# Patient Record
Sex: Male | Born: 1970 | Race: Black or African American | Hispanic: No | Marital: Single | Smoking: Former smoker
Health system: Southern US, Community
[De-identification: ages and names within clinical notes are randomized; demographics above are authoritative.]

## PROBLEM LIST (undated history)

## (undated) DIAGNOSIS — IMO0002 Reserved for concepts with insufficient information to code with codable children: Secondary | ICD-10-CM

## (undated) DIAGNOSIS — I10 Essential (primary) hypertension: Secondary | ICD-10-CM

## (undated) DIAGNOSIS — E119 Type 2 diabetes mellitus without complications: Secondary | ICD-10-CM

## (undated) DIAGNOSIS — F32A Depression, unspecified: Secondary | ICD-10-CM

## (undated) HISTORY — DX: Reserved for concepts with insufficient information to code with codable children: IMO0002

## (undated) HISTORY — DX: Depression, unspecified: F32.A

## (undated) HISTORY — PX: EYE SURGERY: SHX253

---

## 2010-10-16 ENCOUNTER — Ambulatory Visit: Payer: Self-pay | Admitting: Internal Medicine

## 2011-12-18 ENCOUNTER — Ambulatory Visit: Payer: Self-pay | Admitting: Internal Medicine

## 2011-12-24 ENCOUNTER — Ambulatory Visit: Payer: Self-pay | Admitting: Internal Medicine

## 2011-12-24 LAB — COMPREHENSIVE METABOLIC PANEL
Anion Gap: 8 (ref 7–16)
BUN: 12 mg/dL (ref 7–18)
Bilirubin,Total: 0.3 mg/dL (ref 0.2–1.0)
Calcium, Total: 8.8 mg/dL (ref 8.5–10.1)
Chloride: 102 mmol/L (ref 98–107)
Co2: 28 mmol/L (ref 21–32)
Creatinine: 1.07 mg/dL (ref 0.60–1.30)
Osmolality: 285 (ref 275–301)
Potassium: 4.4 mmol/L (ref 3.5–5.1)
SGOT(AST): 15 U/L (ref 15–37)
Sodium: 138 mmol/L (ref 136–145)

## 2011-12-24 LAB — CBC WITH DIFFERENTIAL/PLATELET
Basophil #: 0 10*3/uL (ref 0.0–0.1)
Eosinophil #: 0.2 10*3/uL (ref 0.0–0.7)
Eosinophil %: 2.4 %
HCT: 39.7 % — ABNORMAL LOW (ref 40.0–52.0)
HGB: 13.5 g/dL (ref 13.0–18.0)
Lymphocyte #: 3.7 10*3/uL — ABNORMAL HIGH (ref 1.0–3.6)
Lymphocyte %: 36.8 %
MCH: 29.4 pg (ref 26.0–34.0)
MCV: 86 fL (ref 80–100)
Neutrophil #: 5.4 10*3/uL (ref 1.4–6.5)
Neutrophil %: 53.9 %
Platelet: 324 10*3/uL (ref 150–440)

## 2011-12-24 LAB — MAGNESIUM: Magnesium: 2.2 mg/dL

## 2012-09-09 ENCOUNTER — Ambulatory Visit: Payer: Self-pay | Admitting: Family Medicine

## 2012-11-30 ENCOUNTER — Ambulatory Visit: Payer: Self-pay | Admitting: Family Medicine

## 2013-12-22 ENCOUNTER — Inpatient Hospital Stay: Payer: Self-pay | Admitting: Internal Medicine

## 2013-12-22 LAB — URINALYSIS, COMPLETE
BILIRUBIN, UR: NEGATIVE
BLOOD: NEGATIVE
Bacteria: NONE SEEN
Glucose,UR: >=500 mg/dL (ref 0–75)
Leukocyte Esterase: NEGATIVE
Nitrite: NEGATIVE
Ph: 5 (ref 4.5–8.0)
Protein: 30
SPECIFIC GRAVITY: 1.025 (ref 1.003–1.030)
Squamous Epithelial: 1
WBC UR: 4 /HPF (ref 0–5)

## 2013-12-22 LAB — BASIC METABOLIC PANEL
ANION GAP: 11 (ref 7–16)
BUN: 27 mg/dL — ABNORMAL HIGH (ref 7–18)
Calcium, Total: 10 mg/dL (ref 8.5–10.1)
Chloride: 103 mmol/L (ref 98–107)
Co2: 24 mmol/L (ref 21–32)
Creatinine: 2.24 mg/dL — ABNORMAL HIGH (ref 0.60–1.30)
EGFR (African American): 40 — ABNORMAL LOW
EGFR (Non-African Amer.): 35 — ABNORMAL LOW
Glucose: 201 mg/dL — ABNORMAL HIGH (ref 65–99)
OSMOLALITY: 286 (ref 275–301)
POTASSIUM: 3.6 mmol/L (ref 3.5–5.1)
SODIUM: 138 mmol/L (ref 136–145)

## 2013-12-22 LAB — CBC WITH DIFFERENTIAL/PLATELET
BASOS ABS: 0 10*3/uL (ref 0.0–0.1)
Basophil %: 0.2 %
Eosinophil #: 0.1 10*3/uL (ref 0.0–0.7)
Eosinophil %: 0.6 %
HCT: 42.2 % (ref 40.0–52.0)
HGB: 14.5 g/dL (ref 13.0–18.0)
Lymphocyte #: 3.9 10*3/uL — ABNORMAL HIGH (ref 1.0–3.6)
Lymphocyte %: 30.1 %
MCH: 28.9 pg (ref 26.0–34.0)
MCHC: 34.3 g/dL (ref 32.0–36.0)
MCV: 84 fL (ref 80–100)
Monocyte #: 0.8 x10 3/mm (ref 0.2–1.0)
Monocyte %: 6.1 %
NEUTROS PCT: 63 %
Neutrophil #: 8.1 10*3/uL — ABNORMAL HIGH (ref 1.4–6.5)
PLATELETS: 369 10*3/uL (ref 150–440)
RBC: 5.02 10*6/uL (ref 4.40–5.90)
RDW: 13.4 % (ref 11.5–14.5)
WBC: 12.9 10*3/uL — AB (ref 3.8–10.6)

## 2013-12-22 LAB — CK: CK, Total: 384 U/L — ABNORMAL HIGH

## 2013-12-22 LAB — TROPONIN I: Troponin-I: <0.02 ng/mL

## 2013-12-23 LAB — CK: CK, Total: 245 U/L

## 2013-12-23 LAB — BASIC METABOLIC PANEL
ANION GAP: 5 — AB (ref 7–16)
BUN: 21 mg/dL — AB (ref 7–18)
CREATININE: 1.36 mg/dL — AB (ref 0.60–1.30)
Calcium, Total: 8.6 mg/dL (ref 8.5–10.1)
Chloride: 106 mmol/L (ref 98–107)
Co2: 28 mmol/L (ref 21–32)
EGFR (African American): >60
GLUCOSE: 165 mg/dL — AB (ref 65–99)
Osmolality: 284 (ref 275–301)
Potassium: 3.8 mmol/L (ref 3.5–5.1)
SODIUM: 139 mmol/L (ref 136–145)

## 2013-12-23 LAB — CBC WITH DIFFERENTIAL/PLATELET
BASOS ABS: 0 10*3/uL (ref 0.0–0.1)
BASOS PCT: 0.4 %
EOS PCT: 2.3 %
Eosinophil #: 0.2 10*3/uL (ref 0.0–0.7)
HCT: 38.3 % — ABNORMAL LOW (ref 40.0–52.0)
HGB: 13.1 g/dL (ref 13.0–18.0)
Lymphocyte #: 3.4 10*3/uL (ref 1.0–3.6)
Lymphocyte %: 35.6 %
MCH: 29.1 pg (ref 26.0–34.0)
MCHC: 34.3 g/dL (ref 32.0–36.0)
MCV: 85 fL (ref 80–100)
Monocyte #: 0.7 x10 3/mm (ref 0.2–1.0)
Monocyte %: 7.8 %
NEUTROS ABS: 5.1 10*3/uL (ref 1.4–6.5)
Neutrophil %: 53.9 %
Platelet: 303 10*3/uL (ref 150–440)
RBC: 4.52 10*6/uL (ref 4.40–5.90)
RDW: 13.6 % (ref 11.5–14.5)
WBC: 9.5 10*3/uL (ref 3.8–10.6)

## 2013-12-23 LAB — MAGNESIUM: MAGNESIUM: 2 mg/dL

## 2013-12-23 LAB — HEMOGLOBIN A1C: HEMOGLOBIN A1C: 10.4 % — AB (ref 4.2–6.3)

## 2014-01-31 DIAGNOSIS — K219 Gastro-esophageal reflux disease without esophagitis: Secondary | ICD-10-CM | POA: Insufficient documentation

## 2014-01-31 DIAGNOSIS — F331 Major depressive disorder, recurrent, moderate: Secondary | ICD-10-CM | POA: Insufficient documentation

## 2014-10-20 NOTE — H&P (Signed)
PATIENT NAME:  Gabriel Shaw, [name] MR#:  [id number] DATE OF BIRTH:  [birth year]  DATE OF ADMISSION:  12/22/2013  REASON FOR ADMISSION: Acute kidney injury.  CHIEF COMPLAINT: Body aches and dehydration.   REFERRING PHYSICIAN: Dr. Margarita GrizzleWoodruff.   PRIMARY CARE PHYSICIAN: None at this moment.  HISTORY OF PRESENT ILLNESS: A 44 year old gentleman with history of diabetes, osteoarthritis, bilateral hip pain, status post removal of brain tumor with consequences leaving him with mood disorder and memory loss. 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 The patient started at 8:00 a.m. and tried to hydrate himself. He was drinking large amounts of water, but it was so hot, they were sweating a lot, and he got dehydrated really quickly. The patient also worked yesterday and he was still really tired from the work routine of yesterday. At around 2:00 p.m., the patient started feeling dizzy, lightheaded, had foggy vision, started to get really nauseous and was not able to drink anymore. He has not been able to urinate much today and his urine has been really concentrated. The patient started to have achy pains all over his body, muscle aches, and joint aches with intensity of 4/10. No radiation. Cramp-like during the whole afternoon. The patient is admitted to the Emergency Department. We were asked for consultation as his creatinine is 2.24. His previous creatinine was around 1.  REVIEW OF SYSTEMS:   CONSTITUTIONAL: No fever, fatigue, weight loss, or weight gain.  EYES: No blurry vision or double vision.  Foggy vision earlier, but now resolved. EARS, NOSE, AND THROAT:  No difficulty swallowing or tinnitus.  RESPIRATORY: No cough or wheezing. Patient smokes 2-3 cigarettes a day. Smoking cessation counseling given to the patient for 3 minutes, and he is not ready to quit smoking.  CARDIOVASCULAR: No chest pain, orthopnea, or palpitations.  GASTROINTESTINAL: No nausea, vomiting,  abdominal pain, or constipation prior to today. Today he was nauseous, but it has resolved.  GENITOURINARY: No dysuria, hematuria, changes in frequency.   ENDOCRINE: No polyuria, polydipsia, or polyphagia.  HEMATOLOGIC AND LYMPHATIC: No anemia or easy bruising.  SKIN: No rashes or petechiae.  MUSCULOSKELETAL: No neck pain or back pain. Positive bilateral hip pain.  NEUROLOGIC: Status post removal of brain tumor and some memory loss after that.  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   PAST MEDICAL HISTORY:  1.  Type 2 diabetes, insulin-dependent.  2.  Osteoarthritis.  3.  Bilateral hip pain.  4.  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  5.  History of brain tumor, now removed.  6.  Memory loss.   ALLERGIES: NO KNOWN DRUG ALLERGIES.   PAST SURGICAL HISTORY: Brain tumor removed in 1997.   FAMILY HISTORY: Positive for CVA, myocardial infarction, diabetes, and colon cancer in his grandmother.   SOCIAL HISTORY: Alcohol: He drinks 2 beers a couple times a week after he works long days, but not every day.  Tobacco: He smokes 2-3 cigarettes every day. No IV drugs. No other drugs. Lives with his mom.   MEDICATIONS: Metformin 1000 mg twice a day and positive long-lasting insulin, either Levemir or Lantus 25 units twice daily. Denies any other medications.   PHYSICAL EXAMINATION:  VITAL SIGNS: Blood pressure 137/94, pulse 94-105, respirations up to 24, temperature 98, oxygen saturation 98% on room air.  GENERAL: The patient is alert and oriented x 3 in no acute distress. No respiratory distress. Hemodynamically stable.  HEENT: Pupils are equal, round, and  reactive. Extraocular movements are intact. Mucosa are moist. Anicteric sclerae. Pink conjunctivae.  No oral lesions. No oropharyngeal exudates.  NECK: Supple. No JVD. No thyromegaly. No adenopathy. No carotid bruits. No rigidity.  CARDIOVASCULAR: Regular rate and rhythm. No murmurs, rubs, or gallops.   Slightly tachycardic. No displacement of PMI.  LUNGS: Clear without any wheezing or crepitus. No use of accessory muscles.  ABDOMEN: Soft, nontender, nondistended. No hepatosplenomegaly. No masses. Bowel sounds are positive.  EXTREMITIES: No edema, cyanosis, or clubbing. Pulses +2. Capillary refill less than 3.  GENITALIA:  Deferred.  MUSCULOSKELETAL: No joint effusions, soreness, or swelling.  LYMPHATIC: Negative for lymphadenopathy in neck or supraclavicular areas. NEUROLOGICAL: Cranial nerves II-XII intact. Strength is 5/5 in all 4 extremities.  PSYCHIATRIC: No agitation. The patient was alert and oriented x 3.   RESULTS: Glucose is 201, BUN 27, creatinine 2.24, sodium 138, potassium 2.6, total CK 384. Troponin is 0.02.  White blood count is 12.9. Urinalysis 30 mg of protein for white blood cells.   CHEST X-RAY: No active disease.   ASSESSMENT AND PLAN: A 44 year old gentleman with history of diabetes admitted with  acute kidney injury.   1.  Acute kidney injury. The patient got dehydrated after working outside without the proper hydration. The patient has likely intravascular volume depletion with a prerenal acute kidney injury. At this moment, the patient is hemodynamically stable. We are going to continue IV fluids 125 mL an hour and follow creatinine in the morning.  2.  The patient has a lot of body aches, unspecific. This is likely due to heat stroke symptoms. His CK is slightly elevated  in the 300s. We are going to follow up on CK in the morning to rule out rhabdomyolysis.   3.  Continue hydration. Ultrasound of the kidneys to rule out the possibility of obstruction as the patient has not been urinating much today.   4.  Diabetes. The patient has metformin and Levemir.  We are going to give him the Levemir that he takes, which is 25 units a day, and hold metformin as the patient is in acute kidney injury.   5.  Leukocytosis. This is likely secondary to the dehydration, reactive.  The patient has systemic inflammatory response syndrome and is secondary to the acute kidney injury. He has leukocytosis with elevation of heart rate and respiratory rate, but not any significant signs of infection. As far as his diabetes, we are going to give him the Levemir only 10 units.  If it rises,  then give him a total of 25 as he has not been eating and drinking much and he was nauseous. Other than that, the patient is doing okay.   6.  Deep vein thrombosis prophylaxis with heparin.   7.  Gastrointestinal prophylaxis gastrointestinal prophylaxis with Protonix.   I spent about 45 minutes with this patient.     ____________________________ Felipa Furnace, MD rsg:ts D: 12/22/2013 18:12:06 ET T: 12/22/2013 18:49:18 ET JOB#: [id number]  cc: Felipa Furnace, MD, <Dictator>  Juanda Chance MD ELECTRONICALLY SIGNED 01/11/2014 1:34

## 2014-10-20 NOTE — Discharge Summary (Signed)
PATIENT NAME:  Gabriel Shaw, Gabriel Shaw MR#:  [id number] DATE OF BIRTH:  [birth year]  DATE OF ADMISSION:  12/22/2013 DATE OF DISCHARGE:  12/23/2013  FINAL DIAGNOSES: 1. Heat stroke, acute renal failure, dehydration.  2. Diabetes.  3. Tobacco abuse.   MEDICATIONS: On discharge: He uses 25 units of either Lantus or Levemir at home, and he takes that once a day. I stopped the ibuprofen and metformin.  DIET: Carbohydrate-controlled diet, regular consistency.   ACTIVITY: As tolerated. Follow-up 1 to 2 weeks with your medical doctor.   HOSPITAL COURSE: The patient was admitted 12/22/2013 with body aches, dehydration, was admitted with acute renal failure and given IV fluid hydration.   LABORATORY AND RADIOLOGICAL DATA DURING THE HOSPITAL COURSE: Included a troponin that was negative. CPK 384. Urinalysis greater than 500 mg/dL of glucose. Glucose 201, BUN 27, creatinine 2.24, sodium 138, potassium 3.6, chloride 103, CO2 24, calcium 10. White blood cell count 12.9, hemoglobin and hematocrit 14.5 and 42.2, platelet count of 369,000. Hemoglobin A1c 10.4. Creatinine upon discharge 1.36. White count 95, hemoglobin 13.1. Chest x-ray: No acute disease.  Ultrasound of the kidney normal.   HOSPITAL COURSE PER PROBLEM LIST:  1. For the patient's heat stroke, acute renal failure and dehydration: The patient was given IV fluid hydration. Creatinine improved down to 1.3. Follow up as outpatient for this. Patient can continue hydration at home. I think part of the issue is he works at a truck stop, working trucks, and was out in the heat. He tries to stay hydrated but was not feeling good for a couple days.  2. Diabetes. I stopped his metformin with his acute renal failure. Can consider restarting it as outpatient. Continue the insulin as outpatient as he takes at home.  3. Tobacco abuse. Smoking cessation counseling done during the hospital stay.   TIME SPENT ON DISCHARGE: 35 minutes    ____________________________ Herschell Dimesichard J. Renae GlossWieting, MD rjw:lm D: 12/23/2013 16:05:00 ET T: 12/24/2013 05:26:03 ET JOB#: [id number]  cc: Herschell Dimesichard J. Renae GlossWieting, MD, <Dictator> Salley ScarletICHARD J  MD ELECTRONICALLY SIGNED 12/25/2013 15:34

## 2017-06-25 DIAGNOSIS — E1142 Type 2 diabetes mellitus with diabetic polyneuropathy: Secondary | ICD-10-CM | POA: Insufficient documentation

## 2017-07-13 ENCOUNTER — Encounter: Payer: Self-pay | Admitting: Emergency Medicine

## 2017-07-13 ENCOUNTER — Emergency Department: Payer: BLUE CROSS/BLUE SHIELD

## 2017-07-13 ENCOUNTER — Other Ambulatory Visit: Payer: Self-pay

## 2017-07-13 ENCOUNTER — Emergency Department
Admission: EM | Admit: 2017-07-13 | Discharge: 2017-07-13 | Disposition: A | Discharge status: Home / Self Care | Payer: BLUE CROSS/BLUE SHIELD | Attending: Emergency Medicine | Admitting: Emergency Medicine

## 2017-07-13 DIAGNOSIS — R531 Weakness: Secondary | ICD-10-CM | POA: Insufficient documentation

## 2017-07-13 DIAGNOSIS — R1013 Epigastric pain: Secondary | ICD-10-CM | POA: Diagnosis not present

## 2017-07-13 DIAGNOSIS — R110 Nausea: Secondary | ICD-10-CM | POA: Diagnosis not present

## 2017-07-13 DIAGNOSIS — R1010 Upper abdominal pain, unspecified: Secondary | ICD-10-CM | POA: Diagnosis not present

## 2017-07-13 DIAGNOSIS — R109 Unspecified abdominal pain: Secondary | ICD-10-CM | POA: Diagnosis not present

## 2017-07-13 DIAGNOSIS — Z87891 Personal history of nicotine dependence: Secondary | ICD-10-CM | POA: Diagnosis not present

## 2017-07-13 DIAGNOSIS — L97529 Non-pressure chronic ulcer of other part of left foot with unspecified severity: Secondary | ICD-10-CM | POA: Diagnosis not present

## 2017-07-13 DIAGNOSIS — R55 Syncope and collapse: Secondary | ICD-10-CM

## 2017-07-13 DIAGNOSIS — E11621 Type 2 diabetes mellitus with foot ulcer: Secondary | ICD-10-CM | POA: Diagnosis not present

## 2017-07-13 DIAGNOSIS — R079 Chest pain, unspecified: Secondary | ICD-10-CM | POA: Diagnosis present

## 2017-07-13 LAB — HEPATIC FUNCTION PANEL
ALBUMIN: 3.8 g/dL (ref 3.5–5.0)
ALK PHOS: 69 U/L (ref 38–126)
ALT: 16 U/L — ABNORMAL LOW (ref 17–63)
AST: 28 U/L (ref 15–41)
BILIRUBIN TOTAL: 0.7 mg/dL (ref 0.3–1.2)
Bilirubin, Direct: 0.2 mg/dL (ref 0.1–0.5)
Indirect Bilirubin: 0.5 mg/dL (ref 0.3–0.9)
Total Protein: 7.7 g/dL (ref 6.5–8.1)

## 2017-07-13 LAB — GLUCOSE, CAPILLARY: Glucose-Capillary: 114 mg/dL — ABNORMAL HIGH (ref 65–99)

## 2017-07-13 LAB — BASIC METABOLIC PANEL
Anion gap: 15 (ref 5–15)
BUN: 15 mg/dL (ref 6–20)
CHLORIDE: 100 mmol/L — AB (ref 101–111)
CO2: 25 mmol/L (ref 22–32)
Calcium: 9.5 mg/dL (ref 8.9–10.3)
Creatinine, Ser: 1.43 mg/dL — ABNORMAL HIGH (ref 0.61–1.24)
GFR calc Af Amer: >60 mL/min (ref >60)
GFR calc non Af Amer: 57 mL/min — ABNORMAL LOW (ref >60)
GLUCOSE: 150 mg/dL — AB (ref 65–99)
POTASSIUM: 4.6 mmol/L (ref 3.5–5.1)
Sodium: 140 mmol/L (ref 135–145)

## 2017-07-13 LAB — LIPASE, BLOOD: Lipase: 28 U/L (ref 11–51)

## 2017-07-13 LAB — CBC
HEMATOCRIT: 43.4 % (ref 40.0–52.0)
Hemoglobin: 14.4 g/dL (ref 13.0–18.0)
MCH: 27.8 pg (ref 26.0–34.0)
MCHC: 33.1 g/dL (ref 32.0–36.0)
MCV: 84.1 fL (ref 80.0–100.0)
Platelets: 427 10*3/uL (ref 150–440)
RBC: 5.16 MIL/uL (ref 4.40–5.90)
RDW: 13.2 % (ref 11.5–14.5)
WBC: 11.7 10*3/uL — ABNORMAL HIGH (ref 3.8–10.6)

## 2017-07-13 LAB — TROPONIN I: Troponin I: <0.03 ng/mL (ref <0.03)

## 2017-07-13 MED ORDER — SODIUM CHLORIDE 0.9 % IV BOLUS (SEPSIS)
500.0000 mL | Freq: Once | INTRAVENOUS | Status: AC
  Administered 2017-07-13: 500 mL via INTRAVENOUS

## 2017-07-13 NOTE — ED Triage Notes (Signed)
Pt to ed from Dr. Racheal Patchesroxler's office for chest pain and nausea. Pt was being seen there for diabetic ulcer.  While waiting pt started having epigastric and chest pain and became weak.  Pt brought over via wheelchair for same.  Pt placed in room 4 and iV started and labs drawn.  Pt placed on monitor and EKG done.

## 2017-07-13 NOTE — ED Provider Notes (Signed)
[facility] Emergency Department Provider Note   ____________________________________________   First MD Initiated Contact with Patient 07/13/17 1557     (approximate)  I have reviewed the triage vital signs and the nursing notes.   HISTORY  Chief Complaint Chest Pain    HPI Karel JarvisSamuel E Stovall is a 47 y.o. male to history of diabetes, also under the care of podiatry for a ulcer on the left lower foot.  Patient reports that he was at the podiatry office, he was in the waiting room and reports he started feeling very nauseous and hot.  They got him up to walk to the see the office, he reports he started feeling very lightheaded, and experienced a discomfort in his upper abdomen which she describes a feeling like a "ball" occurring in the upper mid abdomen.  Associated with nausea but no vomiting.  He reports the pain sort of radiate up into his chest briefly.  At the present time his symptoms have resolved.  Reports he feels much better, he tells me that he has experienced this kind of symptomatology off and on for several times in the past, reports he "never really thought much of it".  Denies passing out.  No palpitations.  No radiating pain to the back jaw or arms.  No numbness or weakness.  Again, he reports he feels better now.  He does report that he had not had anything to eat or drink today except for a couple peanut butter crackers.  Reports is a little unusual, but he had an appointment today.  He is currently on antibiotics for his left foot.  No fevers.  History reviewed. No pertinent past medical history.  There are no active problems to display for this patient.   History reviewed. No pertinent surgical history.  Prior to Admission medications   Not on File    Allergies Patient has no known allergies.  History reviewed. No pertinent family history.  Social History Social History   Tobacco Use  . Smoking status: Former Games developermoker  . Smokeless  tobacco: Never Used  Substance Use Topics  . Alcohol use: No    Frequency: Never  . Drug use: No    Review of Systems Constitutional: No fever/chills Eyes: No visual changes. ENT: No sore throat. Cardiovascular: Denies chest pain except as noted in HPI. Respiratory: Denies shortness of breath. Gastrointestinal: No diarrhea.  No constipation. Genitourinary: Negative for dysuria. Musculoskeletal: Negative for back pain. Skin: Negative for rash. Neurological: Negative for headaches, focal weakness or numbness.    ____________________________________________   PHYSICAL EXAM:  VITAL SIGNS: ED Triage Vitals [07/13/17 1541]  Enc Vitals Group     BP 116/87     Pulse Rate 90     Resp 16     Temp 97.6 F (36.4 C)     Temp Source Oral     SpO2 98 %     Weight 230 lb (104.3 kg)     Height      Head Circumference      Peak Flow      Pain Score      Pain Loc      Pain Edu?      Excl. in GC?     Constitutional: Alert and oriented. Well appearing and in no acute distress. Eyes: Conjunctivae are normal. Head: Atraumatic. Nose: No congestion/rhinnorhea. Mouth/Throat: Mucous membranes are moist. Neck: No stridor.   Cardiovascular: Normal rate, regular rhythm. Grossly normal heart sounds.  Good peripheral  circulation. Respiratory: Normal respiratory effort.  No retractions. Lungs CTAB. Gastrointestinal: Soft and nontender except for some mild tenderness in the epigastrium without rebound or guarding. No distention. Musculoskeletal: No lower extremity tenderness nor edema.  The left lower foot bandages removed, has a clean appearing ulcer at the base of the left foot.  He and his mother reported appears to be looking much better than it did previous.  No evidence of superinfection noted.  No extending erythema. Neurologic:  Normal speech and language. No gross focal neurologic deficits are appreciated.  Skin:  Skin is warm, dry and intact. No rash noted. Psychiatric: Mood and  affect are normal. Speech and behavior are normal.  ____________________________________________   LABS (all labs ordered are listed, but only abnormal results are displayed)  Labs Reviewed  BASIC METABOLIC PANEL - Abnormal; Notable for the following components:      Result Value   Chloride 100 (*)    Glucose, Bld 150 (*)    Creatinine, Ser 1.43 (*)    GFR calc non Af Amer 57 (*)    All other components within normal limits  CBC - Abnormal; Notable for the following components:   WBC 11.7 (*)    All other components within normal limits  GLUCOSE, CAPILLARY - Abnormal; Notable for the following components:   Glucose-Capillary 114 (*)    All other components within normal limits  HEPATIC FUNCTION PANEL - Abnormal; Notable for the following components:   ALT 16 (*)    All other components within normal limits  TROPONIN I  LIPASE, BLOOD   ____________________________________________  EKG  Reviewed interrupt me at 1540 Heart rate 90 QRS 80 QTC 420 Normal sinus rhythm, no evidence of ischemia or ectopy, no ST elevation ____________________________________________  RADIOLOGY  Dg Chest 2 View  Result Date: 07/13/2017 CLINICAL DATA:  Onset of epigastric epigastric and chest pain with nausea and weakness while being seen for diabetic ulcer. EXAM: CHEST  2 VIEW COMPARISON:  Portable chest x-ray of December 22, 2013 FINDINGS: The lungs are adequately inflated and clear. The heart and pulmonary vascularity are normal. The mediastinum is normal in width. There is no pleural effusion. The bony thorax exhibits no acute abnormality. IMPRESSION: There is no pneumonia nor other acute cardiopulmonary abnormality. Electronically Signed   By: David  [place] M.D.   On: 07/13/2017 16:23   US Abdomen Limited Ruq  Result Date: 07/13/2017 CLINICAL DATA:  Abdominal pain, nausea and weakness. EXAM: ULTRASOUND ABDOMEN LIMITED RIGHT UPPER QUADRANT COMPARISON:  None. FINDINGS: Gallbladder: No gallstones or  wall thickening visualized. No sonographic Murphy sign noted by sonographer. Common bile duct: Diameter: 3.0 mm. Liver: No focal lesion identified. Within normal limits in parenchymal echogenicity. Portal vein is patent on color Doppler imaging with normal direction of blood flow towards the liver. IMPRESSION: No acute findings. Electronically Signed   By: Elberta Fortis M.D.   On: 07/13/2017 17:21    Chest x-ray and right upper quadrant ultrasound normal ____________________________________________   PROCEDURES  Procedure(s) performed: None  Procedures  Critical Care performed: No  ____________________________________________   INITIAL IMPRESSION / ASSESSMENT AND PLAN / ED COURSE  Pertinent labs & imaging results that were available during my care of the patient were reviewed by me and considered in my medical decision making (see chart for details).  The patient presents for evaluation of episode of upper epigastric discomfort, nausea and lightheadedness from the primary care clinic.  Anecdotal report was that they noticed he was hypotensive during that  time as well, but on arrival here he is normotensive reports his symptoms are much better.  Very atypical symptoms for an acute cardiac condition and seems atypical.  I will send a troponin, but his EKG is reassuring and he denies any chest pain.  His symptoms do sound like possibly vasovagal in nature, possibly gastrointestinal the very reassuring exam at this time with some mild epigastric discomfort.  Given his history of diabetes and intermittent symptoms similar in the past obtain ultrasound to evaluate make sure there is no gallbladder related disease.  Check LFTs, lipase, troponin.      ----------------------------------------- 6:04 PM on 07/13/2017 -----------------------------------------  Patient reports he continues to feel well, asymptomatic with no pain.  Discussed with the patient, and he actually tells me that he has an  appointment set up with cardiology on Thursday that his primary care doctor had made for him as is been having these intermittent symptoms for several days.  His troponin is normal EKG reassuring is pain-free.  His symptoms sound primarily upper abdominal nature to me though, I do not believe this represents acute cardiac disease.  Discussed with patient his mother, careful return precautions and will follow closely with primary as well as a scheduled cardiology appointment for Thursday.  Discussed careful return precautions  Heart score low risk. Low risk for MACE.  ____________________________________________   FINAL CLINICAL IMPRESSION(S) / ED DIAGNOSES  Final diagnoses:  Abdominal pain  Nausea  Vasovagal episode      NEW MEDICATIONS STARTED DURING THIS VISIT:  New Prescriptions   No medications on file     Note:  This document was prepared using Dragon voice recognition software and may include unintentional dictation errors.     Sharyn Creamer, MD 07/13/17 [phone]

## 2017-07-13 NOTE — ED Notes (Signed)
Patient back from US.

## 2017-07-13 NOTE — Discharge Instructions (Signed)
You have been seen in the Emergency Department (ED) today for evaluation.  As we have discussed today?s test results are normal, but you may require further testing.  Please follow up with the recommended doctor as instructed above in these documents regarding today?s emergent visit and your recent symptoms to discuss further management.    Return to the Emergency Department (ED) if you experience any further chest pain/pressure/tightness, difficulty breathing, or sudden sweating, or other symptoms that concern you.

## 2018-06-13 IMAGING — US US ABDOMEN LIMITED
1 series · 14 of 25 positions shown · non-contrast
Comparison: None.

CLINICAL DATA: Abdominal pain, nausea and weakness.

EXAM:
ULTRASOUND ABDOMEN LIMITED RIGHT UPPER QUADRANT

[Series 1: us abdomen limited · 0.28mm/px · 14 of 48 slices shown]
[im 1/48]
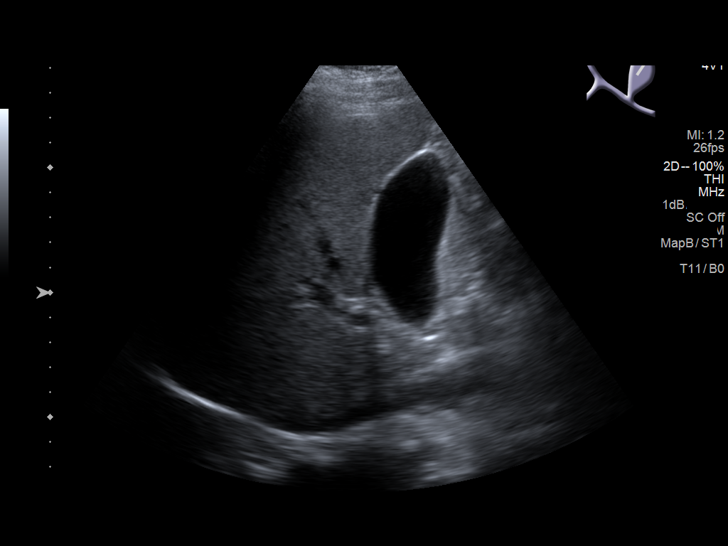
[im 4/48]
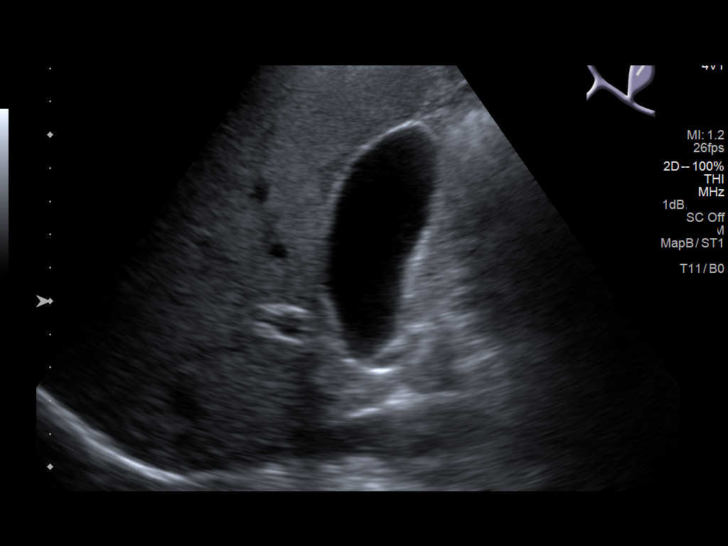
[im 8/48]
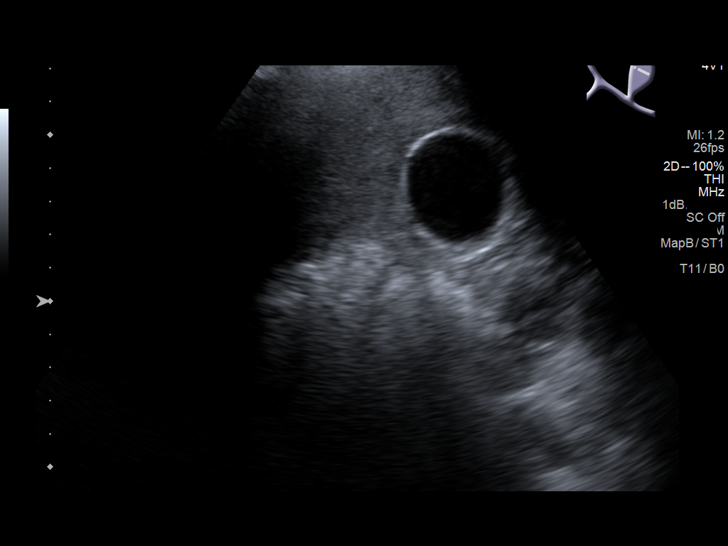
[im 12/48]
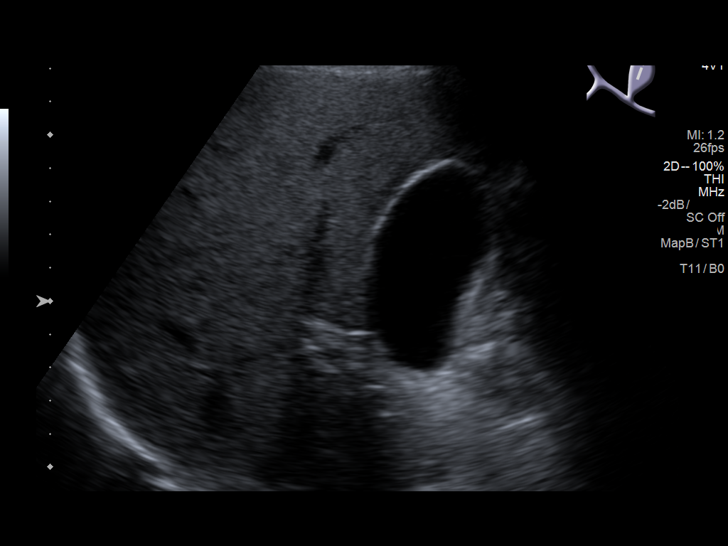
[im 16/48]
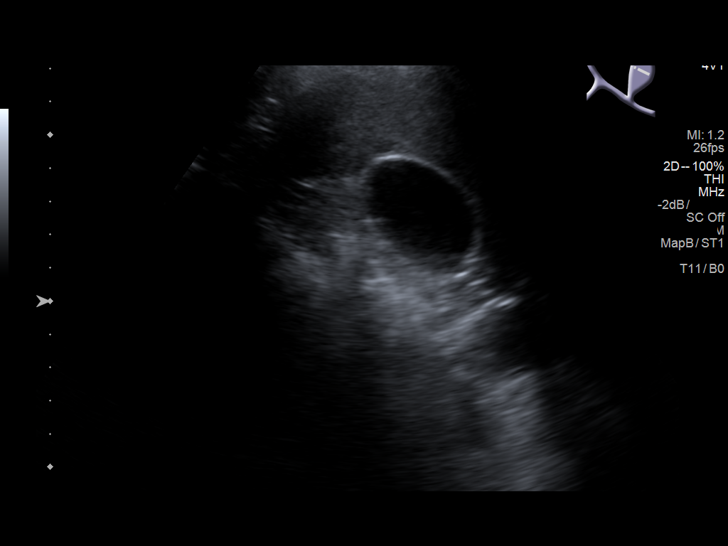
[im 18/48]
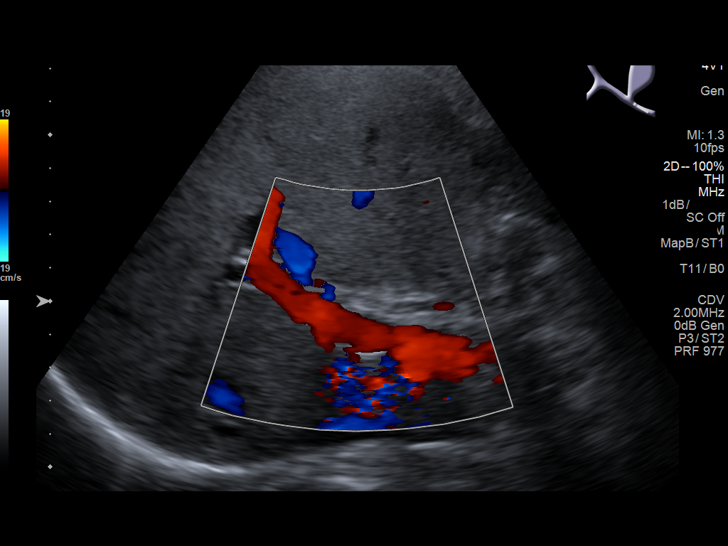
[im 22/48]
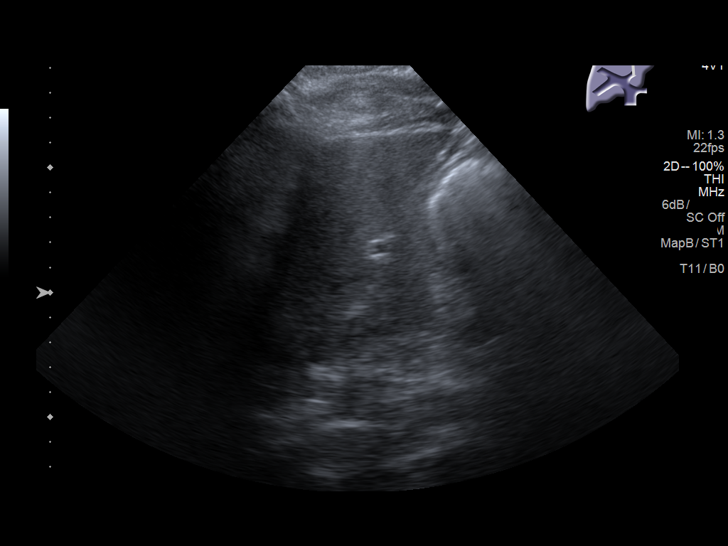
[im 26/48]
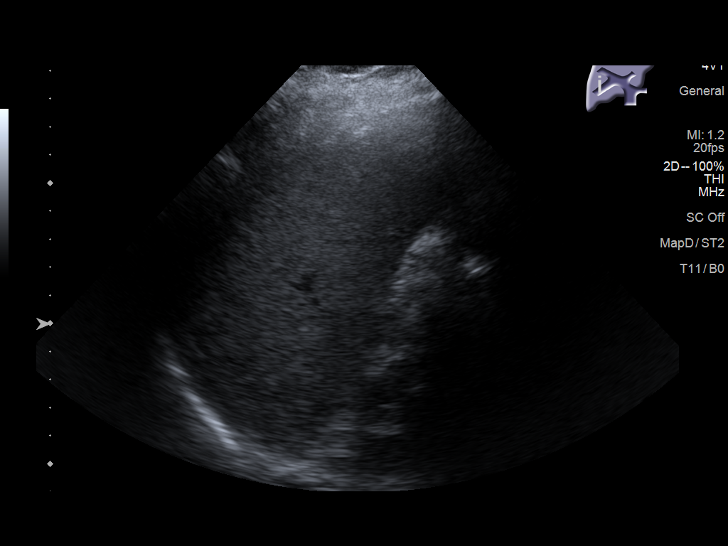
[im 30/48]
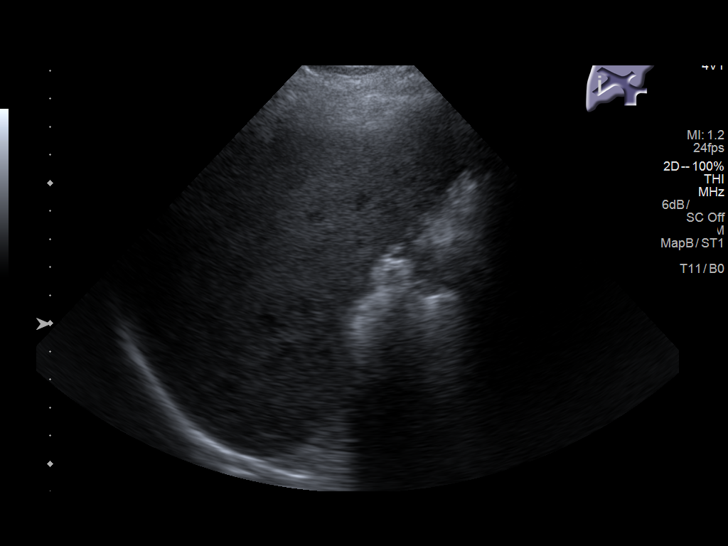
[im 32/48]
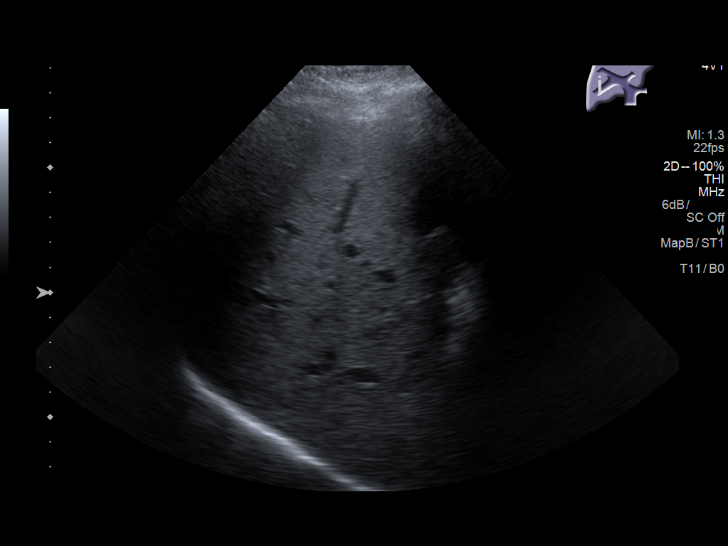
[im 36/48]
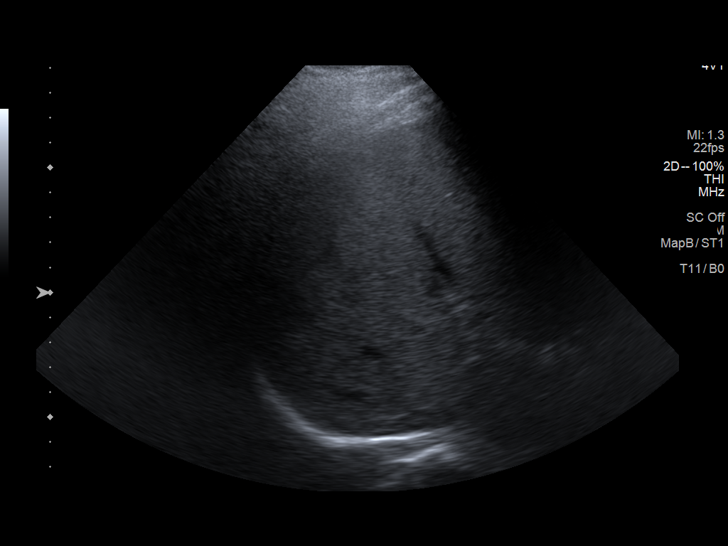
[im 40/48]
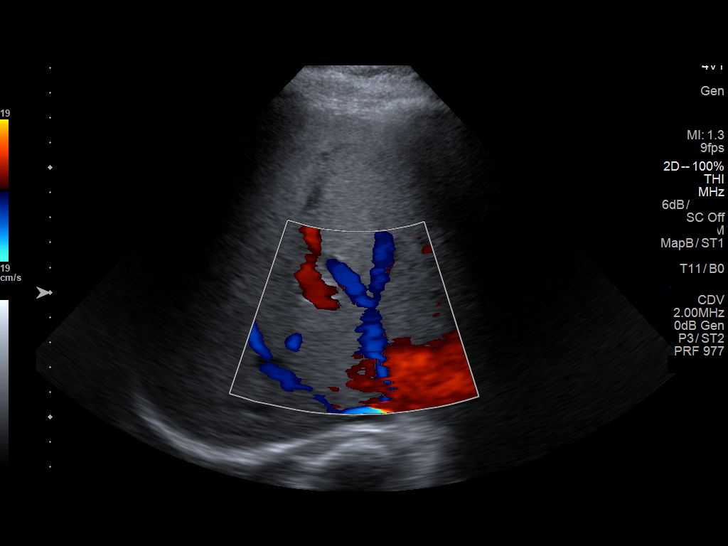
[im 44/48]
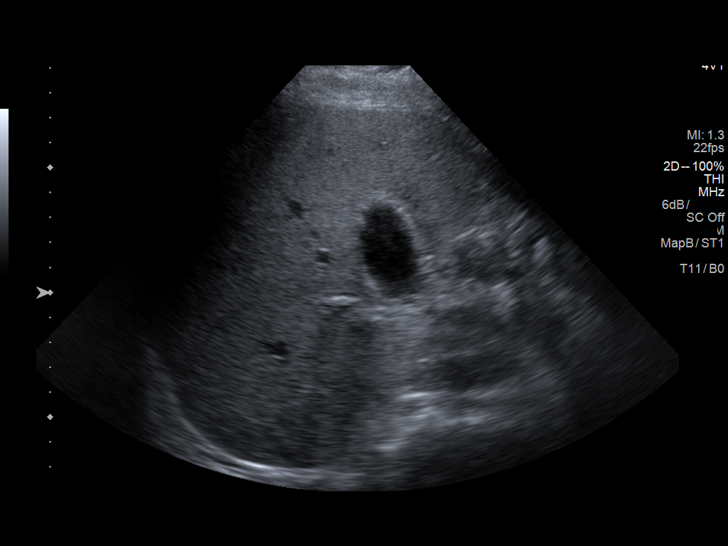
[im 48/48]
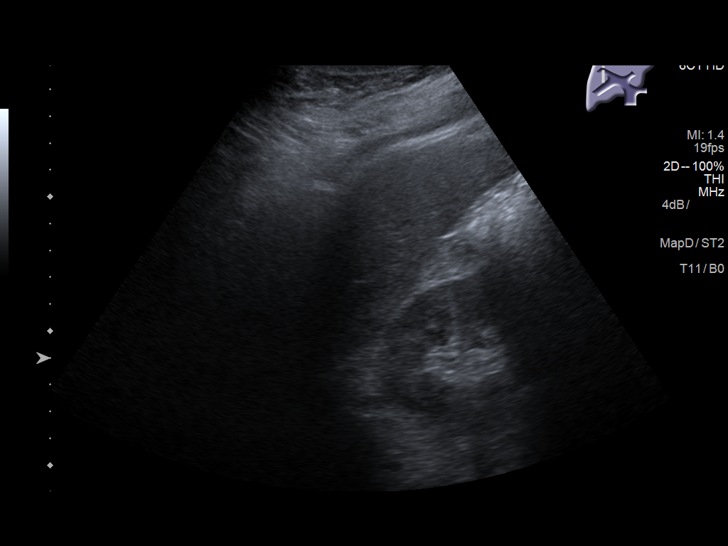

[14 of 25 positions shown; findings below may reference images not displayed]

FINDINGS: Gallbladder:

No gallstones or wall thickening visualized. No sonographic Murphy
sign noted by sonographer.

Common bile duct:

Diameter: 3.0 mm.

Liver:

No focal lesion identified. Within normal limits in parenchymal
echogenicity. Portal vein is patent on color Doppler imaging with
normal direction of blood flow towards the liver.
IMPRESSION: No acute findings.

## 2018-11-28 DIAGNOSIS — I739 Peripheral vascular disease, unspecified: Secondary | ICD-10-CM | POA: Insufficient documentation

## 2019-02-09 IMAGING — CR DG CHEST 2V
2 series · 2 of 2 positions shown · non-contrast
Comparison: Portable chest x-ray December 22, 2013

CLINICAL DATA: Onset of epigastric epigastric and chest pain with
nausea and weakness while being seen for diabetic ulcer.

EXAM:
CHEST  2 VIEW

[chest lat]
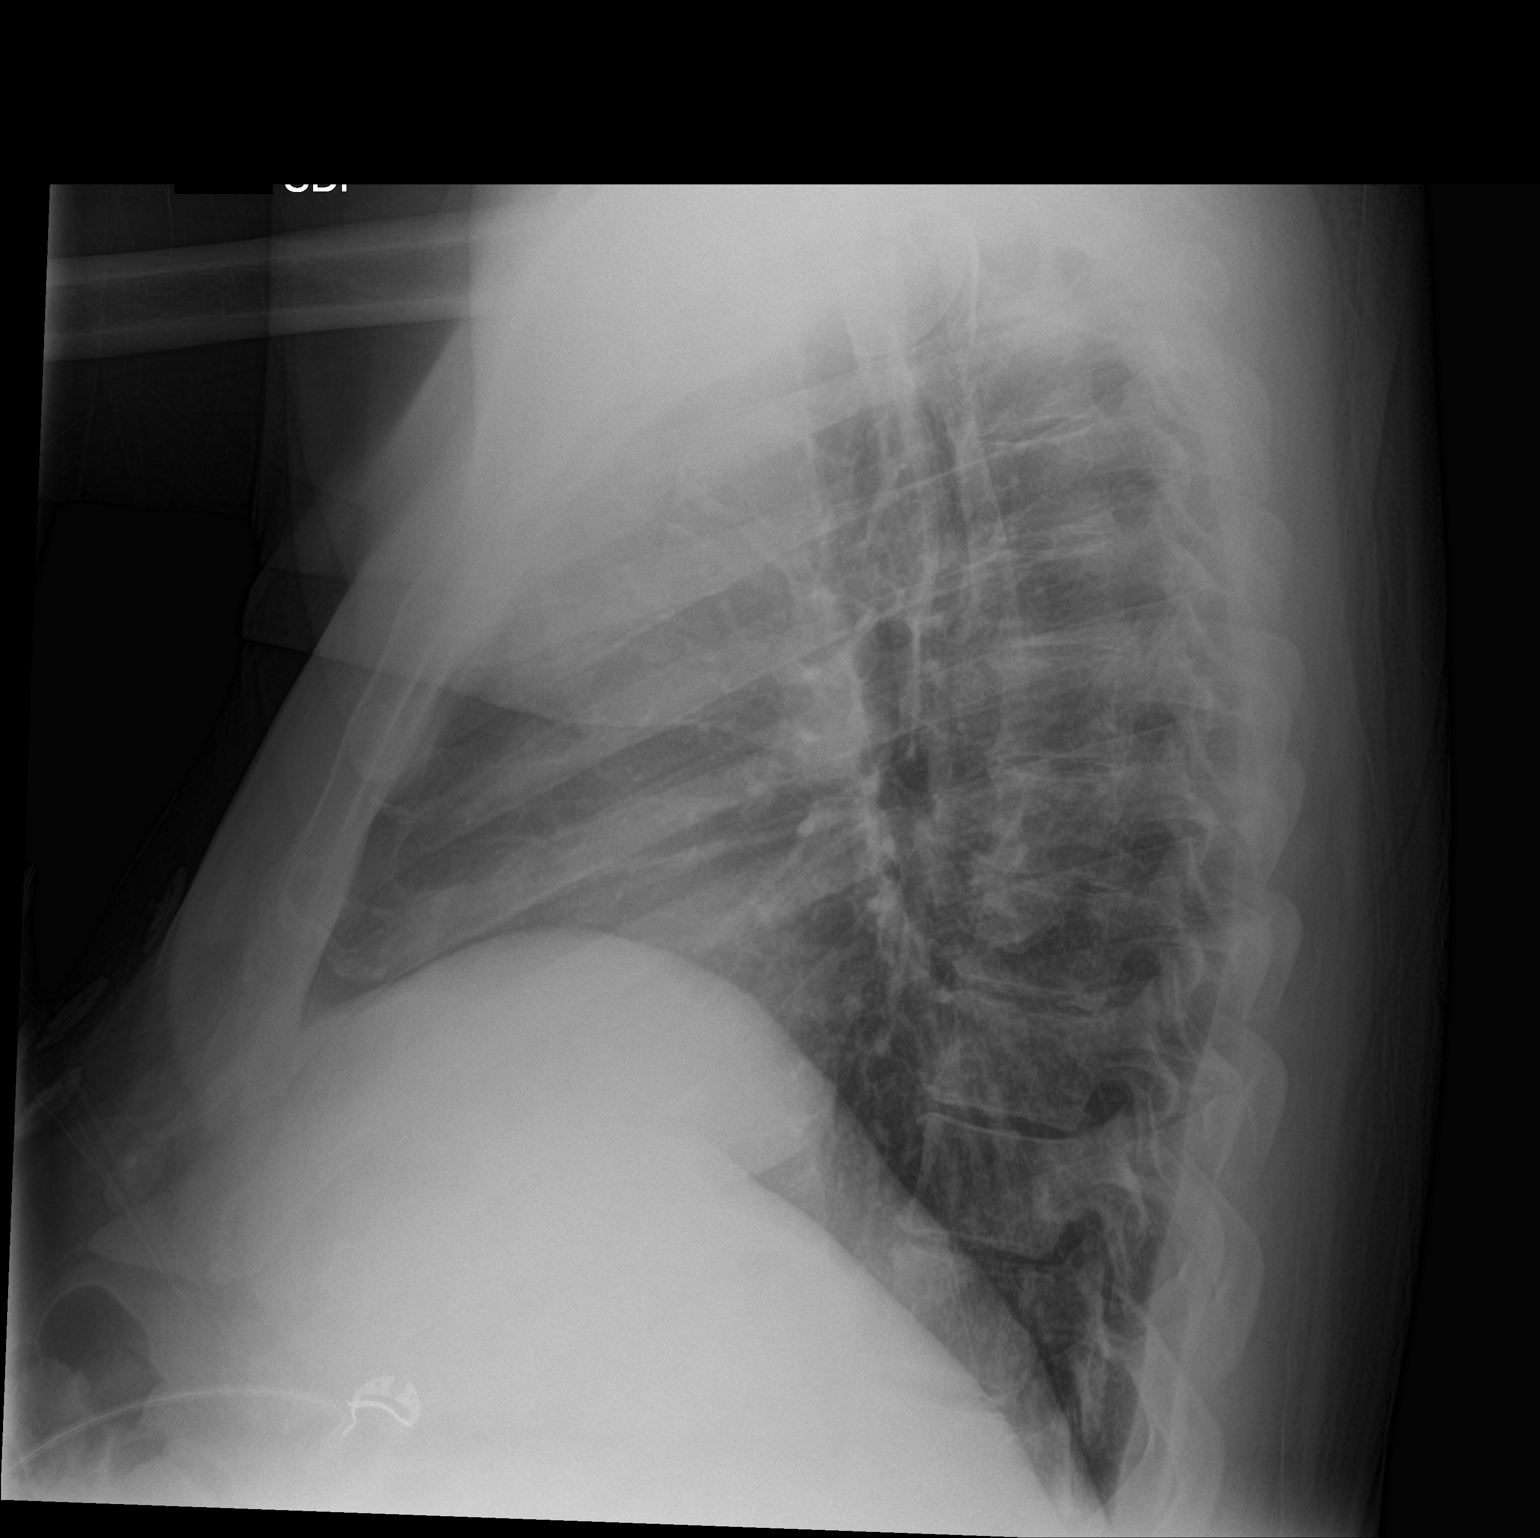

[chest ap]
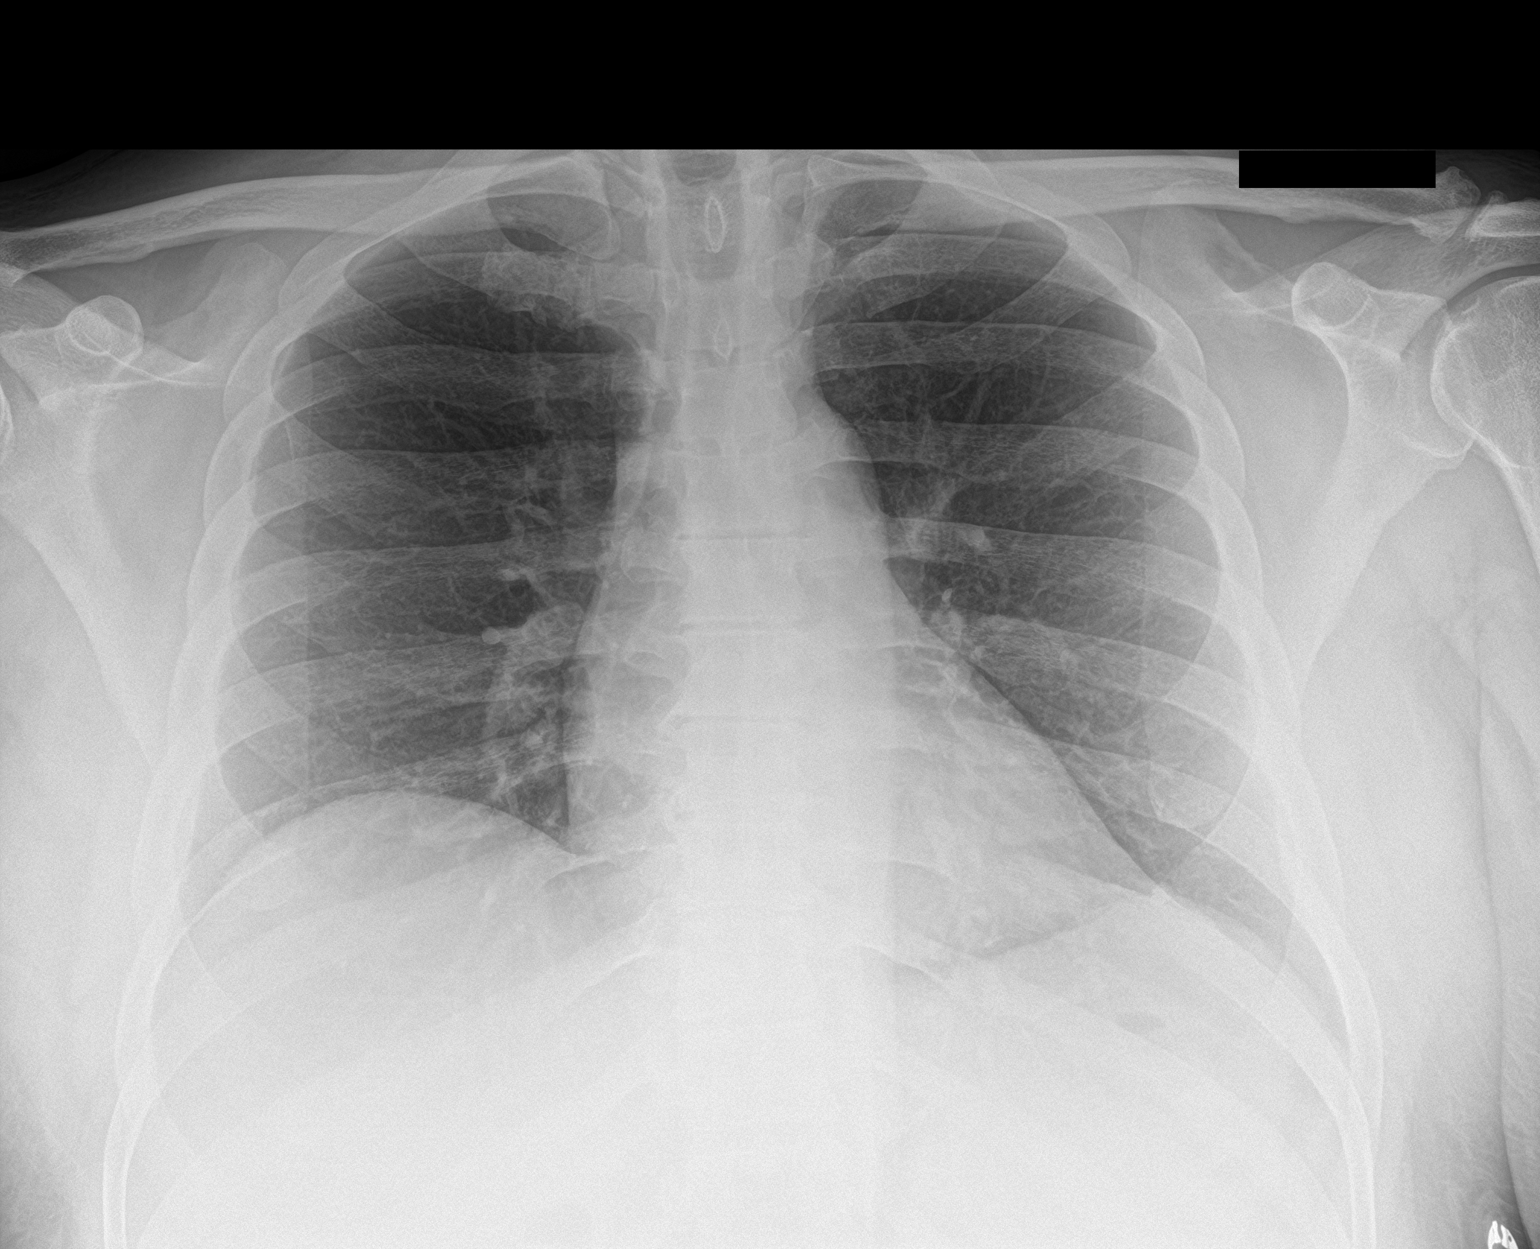

[2 of 2 positions shown; findings below may reference images not displayed]

FINDINGS: The lungs are adequately inflated and clear. The heart and pulmonary
vascularity are normal. The mediastinum is normal in width. There is
no pleural effusion. The bony thorax exhibits no acute abnormality.
IMPRESSION: There is no pneumonia nor other acute cardiopulmonary abnormality.

## 2020-04-25 ENCOUNTER — Emergency Department
Admission: EM | Admit: 2020-04-25 | Discharge: 2020-04-25 | Disposition: A | Discharge status: Home / Self Care | Payer: Managed Care, Other (non HMO) | Attending: Emergency Medicine | Admitting: Emergency Medicine

## 2020-04-25 ENCOUNTER — Other Ambulatory Visit: Payer: Self-pay

## 2020-04-25 ENCOUNTER — Emergency Department: Payer: Managed Care, Other (non HMO)

## 2020-04-25 DIAGNOSIS — R739 Hyperglycemia, unspecified: Secondary | ICD-10-CM | POA: Diagnosis present

## 2020-04-25 DIAGNOSIS — E1165 Type 2 diabetes mellitus with hyperglycemia: Secondary | ICD-10-CM | POA: Diagnosis not present

## 2020-04-25 DIAGNOSIS — R918 Other nonspecific abnormal finding of lung field: Secondary | ICD-10-CM | POA: Diagnosis not present

## 2020-04-25 DIAGNOSIS — Z87891 Personal history of nicotine dependence: Secondary | ICD-10-CM | POA: Diagnosis not present

## 2020-04-25 DIAGNOSIS — E860 Dehydration: Secondary | ICD-10-CM | POA: Insufficient documentation

## 2020-04-25 DIAGNOSIS — Z20822 Contact with and (suspected) exposure to covid-19: Secondary | ICD-10-CM | POA: Diagnosis not present

## 2020-04-25 HISTORY — DX: Type 2 diabetes mellitus without complications: E11.9

## 2020-04-25 LAB — CBG MONITORING, ED
Glucose-Capillary: 416 mg/dL — ABNORMAL HIGH (ref 70–99)
Glucose-Capillary: 369 mg/dL — ABNORMAL HIGH (ref 70–99)

## 2020-04-25 LAB — CBC
HCT: 39.5 % (ref 39.0–52.0)
Hemoglobin: 13.6 g/dL (ref 13.0–17.0)
MCH: 27.6 pg (ref 26.0–34.0)
MCHC: 34.4 g/dL (ref 30.0–36.0)
MCV: 80.3 fL (ref 80.0–100.0)
Platelets: 543 10*3/uL — ABNORMAL HIGH (ref 150–400)
RBC: 4.92 MIL/uL (ref 4.22–5.81)
RDW: 12.1 % (ref 11.5–15.5)
WBC: 11.7 10*3/uL — ABNORMAL HIGH (ref 4.0–10.5)
nRBC: 0 % (ref 0.0–0.2)

## 2020-04-25 LAB — RESPIRATORY PANEL BY RT PCR (FLU A&B, COVID)
Influenza A by PCR: NEGATIVE
Influenza B by PCR: NEGATIVE
SARS Coronavirus 2 by RT PCR: NEGATIVE

## 2020-04-25 LAB — COMPREHENSIVE METABOLIC PANEL
ALT: 13 U/L (ref 0–44)
AST: 14 U/L — ABNORMAL LOW (ref 15–41)
Albumin: 3.8 g/dL (ref 3.5–5.0)
Alkaline Phosphatase: 85 U/L (ref 38–126)
Anion gap: 10 (ref 5–15)
BUN: 15 mg/dL (ref 6–20)
CO2: 29 mmol/L (ref 22–32)
Calcium: 9.7 mg/dL (ref 8.9–10.3)
Chloride: 93 mmol/L — ABNORMAL LOW (ref 98–111)
Creatinine, Ser: 1.27 mg/dL — ABNORMAL HIGH (ref 0.61–1.24)
GFR, Estimated: >60 mL/min (ref >60)
Glucose, Bld: 455 mg/dL — ABNORMAL HIGH (ref 70–99)
Potassium: 4.8 mmol/L (ref 3.5–5.1)
Sodium: 132 mmol/L — ABNORMAL LOW (ref 135–145)
Total Bilirubin: 0.7 mg/dL (ref 0.3–1.2)
Total Protein: 8.8 g/dL — ABNORMAL HIGH (ref 6.5–8.1)

## 2020-04-25 LAB — URINALYSIS, COMPLETE (UACMP) WITH MICROSCOPIC
Bacteria, UA: NONE SEEN
Bilirubin Urine: NEGATIVE
Glucose, UA: >=500 mg/dL — AB
Hgb urine dipstick: NEGATIVE
Ketones, ur: 5 mg/dL — AB
Leukocytes,Ua: NEGATIVE
Nitrite: NEGATIVE
Protein, ur: 100 mg/dL — AB
Specific Gravity, Urine: 1.031 — ABNORMAL HIGH (ref 1.005–1.030)
pH: 5 (ref 5.0–8.0)

## 2020-04-25 LAB — MAGNESIUM: Magnesium: 2 mg/dL (ref 1.7–2.4)

## 2020-04-25 LAB — BETA-HYDROXYBUTYRIC ACID: Beta-Hydroxybutyric Acid: 0.28 mmol/L — ABNORMAL HIGH (ref 0.05–0.27)

## 2020-04-25 LAB — LACTIC ACID, PLASMA: Lactic Acid, Venous: 1.6 mmol/L (ref 0.5–1.9)

## 2020-04-25 MED ORDER — SODIUM CHLORIDE 0.9 % IV BOLUS
1000.0000 mL | Freq: Once | INTRAVENOUS | Status: AC
  Administered 2020-04-25: 1000 mL via INTRAVENOUS

## 2020-04-25 NOTE — ED Notes (Signed)
Doctor's office called due to pt having high blood sugar and low blood pressure. Pt was having visit for followup after bacterial pneumonia. Pt in NAD.

## 2020-04-25 NOTE — ED Provider Notes (Signed)
[facility] Emergency Department Provider Note ____________________________________________   First MD Initiated Contact with Patient 04/25/20 1555     (approximate)  I have reviewed the triage vital signs and the nursing notes.  HISTORY  Chief Complaint Hyperglycemia   HPI Gabriel Shaw is a 49 y.o. malewho presents to the ED for evaluation of hypoglycemia and hypotension.  Chart review indicates patient was seen at urgent care 9 days ago and diagnosed with CAP, treated with Augmentin and azithromycin. Remote smoking history.  Patient reports a history of diabetes on Levemir and sliding scale NovoLog.  Reports compliance with his medications, having plenty of testing strips and supplies at home.  He sees the health department for primary care.  Patient reports about 1 month of feeling poorly with generalized malaise, poor p.o. intake.  Patient reports multiple negative COVID-19 tests.  Patient reports going to the health department today to get his hemoglobin A1c checked and other routine care, when he was noted to be hypotensive at the health department and was transferred here for further evaluation.  Patient reports a busy morning and not having his typical p.o. intake.  He denies having any food today and reports having multiple errands this morning prior to going to the health department.  Patient reports that he feels "fine" right now and reports hunger.  He indicates that his generalized malaise and poor p.o. intake have been improving over the past 1 week and he denies any worsening fevers, productive cough, chest pain, syncope or shortness of breath.  Past Medical History:  Diagnosis Date  . Diabetes mellitus without complication (HCC)     There are no problems to display for this patient.   History reviewed. No pertinent surgical history.  Prior to Admission medications   Not on File    Allergies Patient has no known allergies.  No  family history on file.  Social History Social History   Tobacco Use  . Smoking status: Former Games developer  . Smokeless tobacco: Never Used  Substance Use Topics  . Alcohol use: No  . Drug use: No    Review of Systems  Constitutional: No fever/chills.  Positive for generalized weakness. Eyes: No visual changes. ENT: No sore throat. Cardiovascular: Denies chest pain. Respiratory: Denies shortness of breath. Gastrointestinal: No abdominal pain.  No nausea, no vomiting.  No diarrhea.  No constipation. Genitourinary: Negative for dysuria. Musculoskeletal: Negative for back pain. Skin: Negative for rash. Neurological: Negative for headaches, focal weakness or numbness.  ____________________________________________   PHYSICAL EXAM:  VITAL SIGNS: Vitals:   04/25/20 1500 04/25/20 1630  BP: (!) 83/59 (!) 153/109  Pulse: 96 95  Resp: 18 16  Temp: 98.6 F (37 C)   SpO2: 99% 96%      Constitutional: Alert and oriented. Well appearing and in no acute distress.  Pleasant and conversational full sentences.  Independently ambulatory. Eyes: Conjunctivae are normal. PERRL. EOMI. Head: Atraumatic. Nose: No congestion/rhinnorhea. Mouth/Throat: Mucous membranes are moist.  Oropharynx non-erythematous. Neck: No stridor. No cervical spine tenderness to palpation. Cardiovascular: Normal rate, regular rhythm. Grossly normal heart sounds.  Good peripheral circulation. Respiratory: Normal respiratory effort.  No retractions. Lungs CTAB. Gastrointestinal: Soft , nondistended, nontender to palpation. No abdominal bruits. No CVA tenderness. Musculoskeletal: No lower extremity tenderness nor edema.  No joint effusions. No signs of acute trauma. Neurologic:  Normal speech and language. No gross focal neurologic deficits are appreciated. No gait instability noted. Cranial nerves II through XII intact 5/5 strength and  sensation in all 4 extremities Skin:  Skin is warm, dry and intact. No rash  noted. Psychiatric: Mood and affect are normal. Speech and behavior are normal.  ____________________________________________   LABS (all labs ordered are listed, but only abnormal results are displayed)  Labs Reviewed  CBC - Abnormal; Notable for the following components:      Result Value   WBC 11.7 (*)    Platelets 543 (*)    All other components within normal limits  URINALYSIS, COMPLETE (UACMP) WITH MICROSCOPIC - Abnormal; Notable for the following components:   Color, Urine YELLOW (*)    APPearance CLEAR (*)    Specific Gravity, Urine 1.031 (*)    Glucose, UA >=500 (*)    Ketones, ur 5 (*)    Protein, ur 100 (*)    All other components within normal limits  COMPREHENSIVE METABOLIC PANEL - Abnormal; Notable for the following components:   Sodium 132 (*)    Chloride 93 (*)    Glucose, Bld 455 (*)    Creatinine, Ser 1.27 (*)    Total Protein 8.8 (*)    AST 14 (*)    All other components within normal limits  BETA-HYDROXYBUTYRIC ACID - Abnormal; Notable for the following components:   Beta-Hydroxybutyric Acid 0.28 (*)    All other components within normal limits  CBG MONITORING, ED - Abnormal; Notable for the following components:   Glucose-Capillary 416 (*)    All other components within normal limits  CBG MONITORING, ED - Abnormal; Notable for the following components:   Glucose-Capillary 369 (*)    All other components within normal limits  CULTURE, BLOOD (ROUTINE X 2)  CULTURE, BLOOD (ROUTINE X 2)  RESPIRATORY PANEL BY RT PCR (FLU A&B, COVID)  LACTIC ACID, PLASMA  MAGNESIUM  LACTIC ACID, PLASMA  CBG MONITORING, ED    ____________________________________________  RADIOLOGY  ED MD interpretation: 2 view CXR reviewed by me with nodular right peripheral opacity concerning for resolving pneumonia versus nodular mass  Official radiology report(s): DG Chest 2 View  Result Date: 04/25/2020 CLINICAL DATA:  History of pneumonia EXAM: CHEST - 2 VIEW COMPARISON:   07/13/2017 FINDINGS: Vague right lower lung opacity. No pleural effusion. Normal heart size. No pneumothorax. IMPRESSION: Vague slightly nodular right lower lung opacity, possibly representing a pneumonia though lung nodule is also possible. Short interval radiographic follow-up following therapy is recommended to ensure resolution. Electronically Signed   By: Jasmine Pang M.D.   On: 04/25/2020 17:18    ____________________________________________   PROCEDURES and INTERVENTIONS  Procedure(s) performed (including Critical Care):  .1-3 Lead EKG Interpretation Performed by: Delton Prairie, MD Authorized by: Delton Prairie, MD     Interpretation: normal     ECG rate:  88   ECG rate assessment: normal     Rhythm: sinus rhythm     Ectopy: none     Conduction: normal      Medications  sodium chloride 0.9 % bolus 1,000 mL (0 mLs Intravenous Stopped 04/25/20 1640)    ____________________________________________   MDM / ED COURSE  49 year old diabetic presents to the ED with hypotension and hyperglycemia, likely due to dehydration and poor p.o. intake, amenable to rehydration and outpatient management.  All patients first BP in triage was hypotensive, after IVF and multiple rechecks he maintains normotension and slight hypertension without recurrence of hypotension.  Otherwise normal vital signs on room air.  Exam demonstrates well-appearing patient who has no evidence of acute pathology.  Blood work demonstrates hyperglycemia without  acidosis.  No leukocytosis or lactic acidosis to suggest sepsis.  Urine with glucosuria without infectious features.  Marginal elevation of beta hydroxybutyrate suggestive of dehydration but no significant DKA.  Patient received 1 L of normal saline with resolution of his symptoms, subsequently tolerated p.o. intake without complication.  CXR with right-sided peripheral nodular opacity that may be his resolving pneumonia, for which she has a couple days of  antibiotics remaining, or could be other nodule or opacity.  Educated patient of this uncertainty and strongly encouraged him to follow-up with the health department next week to ensure continued improving symptoms and for repeat imaging.  We discussed return precautions for the ED.  Patient medically stable for discharge home.  Clinical Course as of Apr 25 1834  Thu Apr 25, 2020  1733 CXR reviewed with small nodular opacity to the right peripheral lung field representing possible resolving pneumonia versus nodule/mass.   [DS]  1820 Reassessed.  Patient reports improving symptoms.  No further episodes of hypoglycemia.  Educated patient on x-ray findings with peripheral right nodule consistent with possible mass/nodule or resolving infection.  We discussed finishing his course of antibiotics and management of his diabetes.  Patient reports being eager to go home and is comfortable going home.  We discussed return precautions for the ED.  We discussed following up with the health department for repeat imaging of his chest to further elucidate this right peripheral opacity   [DS]    Clinical Course User Index [DS] Delton Prairie, MD     ____________________________________________   FINAL CLINICAL IMPRESSION(S) / ED DIAGNOSES  Final diagnoses:  Hyperglycemia  Dehydration  Mass of right lung     ED Discharge Orders    None        Katrinka Blazing   Note:  This document was prepared using Dragon voice recognition software and may include unintentional dictation errors.   Delton Prairie, MD 04/25/20 [phone]

## 2020-04-25 NOTE — ED Triage Notes (Addendum)
Pt was sent by [facility] after his blood pressure was low and his HR was elevated- pt states he was supposed to be getting blood work done today- pt was told his CBG was too high for the machine to read it- pt states he recently had pneumonia

## 2020-04-25 NOTE — Discharge Instructions (Signed)
As we discussed, you do have a nodule/mass to the right side of your lungs that could be a resolving pneumonia or something else.  Please finish your course of antibiotics and follow-up with the health department for repeat imaging of your chest to better evaluate this.  They may want to do a CT scan without contrast of your chest to better investigate the spot.  Continue your insulin and diabetic regimen.  If you develop any worsening symptoms, fevers, please return to the ED.

## 2020-04-26 LAB — CULTURE, BLOOD (ROUTINE X 2): Culture: NO GROWTH

## 2020-04-28 LAB — CULTURE, BLOOD (ROUTINE X 2): Culture: NO GROWTH

## 2020-04-30 LAB — CULTURE, BLOOD (ROUTINE X 2)
Special Requests: ADEQUATE
Special Requests: ADEQUATE

## 2020-06-19 ENCOUNTER — Other Ambulatory Visit (HOSPITAL_COMMUNITY): Payer: Self-pay | Admitting: Specialist

## 2020-06-19 ENCOUNTER — Other Ambulatory Visit: Payer: Self-pay | Admitting: Specialist

## 2020-06-19 DIAGNOSIS — R918 Other nonspecific abnormal finding of lung field: Secondary | ICD-10-CM

## 2020-07-11 ENCOUNTER — Other Ambulatory Visit: Payer: Self-pay

## 2020-07-11 ENCOUNTER — Ambulatory Visit
Admission: RE | Admit: 2020-07-11 | Discharge: 2020-07-11 | Disposition: A | Discharge status: Home / Self Care | Payer: Commercial Managed Care - HMO | Source: Ambulatory Visit | Attending: Specialist | Admitting: Specialist

## 2020-07-11 DIAGNOSIS — K760 Fatty (change of) liver, not elsewhere classified: Secondary | ICD-10-CM | POA: Insufficient documentation

## 2020-07-11 DIAGNOSIS — I700 Atherosclerosis of aorta: Secondary | ICD-10-CM | POA: Diagnosis not present

## 2020-07-11 DIAGNOSIS — R918 Other nonspecific abnormal finding of lung field: Secondary | ICD-10-CM | POA: Insufficient documentation

## 2020-07-11 LAB — GLUCOSE, CAPILLARY: Glucose-Capillary: 134 mg/dL — ABNORMAL HIGH (ref 70–99)

## 2020-07-11 MED ORDER — FLUDEOXYGLUCOSE F - 18 (FDG) INJECTION
11.3000 | Freq: Once | INTRAVENOUS | Status: AC | PRN
  Administered 2020-07-11: 11.49 via INTRAVENOUS

## 2020-09-12 ENCOUNTER — Other Ambulatory Visit: Payer: Self-pay

## 2020-09-12 ENCOUNTER — Emergency Department: Payer: Commercial Managed Care - HMO

## 2020-09-12 ENCOUNTER — Emergency Department
Admission: EM | Admit: 2020-09-12 | Discharge: 2020-09-12 | Disposition: A | Discharge status: Home / Self Care | Payer: Commercial Managed Care - HMO | Attending: Student in an Organized Health Care Education/Training Program | Admitting: Student in an Organized Health Care Education/Training Program

## 2020-09-12 DIAGNOSIS — I10 Essential (primary) hypertension: Secondary | ICD-10-CM | POA: Insufficient documentation

## 2020-09-12 DIAGNOSIS — Z87891 Personal history of nicotine dependence: Secondary | ICD-10-CM | POA: Insufficient documentation

## 2020-09-12 DIAGNOSIS — R42 Dizziness and giddiness: Secondary | ICD-10-CM | POA: Diagnosis not present

## 2020-09-12 DIAGNOSIS — E119 Type 2 diabetes mellitus without complications: Secondary | ICD-10-CM | POA: Insufficient documentation

## 2020-09-12 DIAGNOSIS — M25512 Pain in left shoulder: Secondary | ICD-10-CM | POA: Diagnosis not present

## 2020-09-12 HISTORY — DX: Essential (primary) hypertension: I10

## 2020-09-12 LAB — CBC
HCT: 37.5 % — ABNORMAL LOW (ref 39.0–52.0)
Hemoglobin: 13 g/dL (ref 13.0–17.0)
MCH: 27.7 pg (ref 26.0–34.0)
MCHC: 34.7 g/dL (ref 30.0–36.0)
MCV: 80 fL (ref 80.0–100.0)
Platelets: 352 10*3/uL (ref 150–400)
RBC: 4.69 MIL/uL (ref 4.22–5.81)
RDW: 12.8 % (ref 11.5–15.5)
WBC: 10 10*3/uL (ref 4.0–10.5)
nRBC: 0 % (ref 0.0–0.2)

## 2020-09-12 LAB — BASIC METABOLIC PANEL
Anion gap: 10 (ref 5–15)
BUN: 16 mg/dL (ref 6–20)
CO2: 26 mmol/L (ref 22–32)
Calcium: 9.2 mg/dL (ref 8.9–10.3)
Chloride: 99 mmol/L (ref 98–111)
Creatinine, Ser: 1.48 mg/dL — ABNORMAL HIGH (ref 0.61–1.24)
GFR, Estimated: 58 mL/min — ABNORMAL LOW (ref >60)
Glucose, Bld: 268 mg/dL — ABNORMAL HIGH (ref 70–99)
Potassium: 3.8 mmol/L (ref 3.5–5.1)
Sodium: 135 mmol/L (ref 135–145)

## 2020-09-12 LAB — TROPONIN I (HIGH SENSITIVITY)
Troponin I (High Sensitivity): 6 ng/L (ref <18)
Troponin I (High Sensitivity): 4 ng/L (ref <18)

## 2020-09-12 MED ORDER — SODIUM CHLORIDE 0.9 % IV BOLUS
500.0000 mL | Freq: Once | INTRAVENOUS | Status: AC
  Administered 2020-09-12: 500 mL via INTRAVENOUS

## 2020-09-12 MED ORDER — ACETAMINOPHEN 500 MG PO TABS
1000.0000 mg | ORAL_TABLET | Freq: Once | ORAL | Status: AC
  Administered 2020-09-12: 1000 mg via ORAL
  Filled 2020-09-12: qty 2

## 2020-09-12 MED ORDER — CYCLOBENZAPRINE HCL 5 MG PO TABS: 5.0000 mg | ORAL_TABLET | Freq: Three times a day (TID) | ORAL | 0 refills | Status: DC | PRN

## 2020-09-12 NOTE — ED Provider Notes (Signed)
[facility] Emergency Department Provider Note    Event Date/Time   First MD Initiated Contact with Patient 09/12/20 [phone]     (approximate)  I have reviewed the triage vital signs and the nursing notes.   HISTORY  Chief Complaint Shortness of Breath    HPI Gabriel Shaw is a 50 y.o. male below listed past medical history presents to the ER for evaluation of lightheadedness and dizziness that occurred while he was at work.  States he was otherwise feeling normal this morning states that he has been adjusting to recently going back to work in the past few weeks.  Denies any recent surgeries.  No fevers.  States that every now and then when he has a coughing fit he will take an inhaler pack.  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  EMS was called.  He is denying any chest pain right now.  Vital signs are stable.  Thinks that he may have been having a panic attack.  States he otherwise feels okay right now.    Past Medical History:  Diagnosis Date  . Diabetes mellitus without complication (HCC)   . Hypertension    No family history on file. History reviewed. No pertinent surgical history. There are no problems to display for this patient.     Prior to Admission medications   Medication Sig Start Date End Date Taking? Authorizing Provider  cyclobenzaprine (FLEXERIL) 5 MG tablet Take 1 tablet (5 mg total) by mouth 3 (three) times daily as needed for muscle spasms. 09/12/20  Yes Willy Eddy, MD    Allergies Patient has no known allergies.    Social History Social History   Tobacco Use  . Smoking status: Former Games developer  . Smokeless tobacco: Never Used  Substance Use Topics  . Alcohol use: No  . Drug use: No    Review of Systems Patient denies headaches, rhinorrhea, blurry vision, numbness, shortness of  breath, chest pain, edema, cough, abdominal pain, nausea, vomiting, diarrhea, dysuria, fevers, rashes or hallucinations unless otherwise stated above in HPI. ____________________________________________   PHYSICAL EXAM:  VITAL SIGNS: Vitals:   09/12/20 1030 09/12/20 1200  BP: (!) 142/89 132/90  Pulse: 89 88  Resp: 16 (!) 9  Temp:    SpO2: 100% 100%    Constitutional: Alert and oriented.  Eyes: Conjunctivae are normal.  Head: Atraumatic. Nose: No congestion/rhinnorhea. Mouth/Throat: Mucous membranes are moist.   Neck: No stridor. Painless ROM.  Cardiovascular: Normal rate, regular rhythm. Grossly normal heart sounds.  Good peripheral circulation. Respiratory: Normal respiratory effort.  No retractions. Lungs CTAB. Gastrointestinal: Soft and nontender. No distention. No abdominal bruits. No CVA tenderness. Genitourinary:  Musculoskeletal: No lower extremity tenderness nor edema.  No joint effusions. Neurologic:  Normal speech and language. No gross focal neurologic deficits are appreciated. No facial droop Skin:  Skin is warm, dry and intact. No rash noted. Psychiatric: Mood and affect are normal. Speech and behavior are normal.  ____________________________________________   LABS (all labs ordered are listed, but only abnormal results are displayed)  Results for orders placed or performed during the hospital encounter of 09/12/20 (from the past 24 hour(s))  CBC     Status: Abnormal   Collection Time: 09/12/20  8:57 AM  Result Value Ref Range   WBC 10.0 4.0 - 10.5 K/uL   RBC 4.69  4.22 - 5.81 MIL/uL   Hemoglobin 13.0 13.0 - 17.0 g/dL   HCT 86.5 (L) 78.4 - 69.6 %   MCV 80.0 80.0 - 100.0 fL   MCH 27.7 26.0 - 34.0 pg   MCHC 34.7 30.0 - 36.0 g/dL   RDW 29.5 28.4 - 13.2 %   Platelets 352 150 - 400 K/uL   nRBC 0.0 0.0 - 0.2 %  Basic metabolic panel     Status: Abnormal   Collection Time: 09/12/20  8:57 AM  Result Value Ref Range   Sodium 135 135 - 145 mmol/L   Potassium  3.8 3.5 - 5.1 mmol/L   Chloride 99 98 - 111 mmol/L   CO2 26 22 - 32 mmol/L   Glucose, Bld 268 (H) 70 - 99 mg/dL   BUN 16 6 - 20 mg/dL   Creatinine, Ser 4.40 (H) 0.61 - 1.24 mg/dL   Calcium 9.2 8.9 - 10.2 mg/dL   GFR, Estimated 58 (L) >60 mL/min   Anion gap 10 5 - 15  Troponin I (High Sensitivity)     Status: None   Collection Time: 09/12/20  8:57 AM  Result Value Ref Range   Troponin I (High Sensitivity) 6 <18 ng/L  Troponin I (High Sensitivity)     Status: None   Collection Time: 09/12/20 11:44 AM  Result Value Ref Range   Troponin I (High Sensitivity) 4 <18 ng/L   ____________________________________________  EKG My review and personal interpretation at Time: 8:46   Indication: chest pain  Rate: 90  Rhythm: sinus Axis: normal Other: normal intervals, no stemi ____________________________________________  RADIOLOGY  I personally reviewed all radiographic images ordered to evaluate for the above acute complaints and reviewed radiology reports and findings.  These findings were personally discussed with the patient.  Please see medical record for radiology report.  ____________________________________________   PROCEDURES  Procedure(s) performed:  Procedures    Critical Care performed: no ____________________________________________   INITIAL IMPRESSION / ASSESSMENT AND PLAN / ED COURSE  Pertinent labs & imaging results that were available during my care of the patient were reviewed by me and considered in my medical decision making (see chart for details).   DDX: ACS, pericarditis, pe, dissection, pna, bronchitis, costochondritis, dehydration, anxiety   Gabriel Shaw is a 50 y.o. who presents to the ED with agitation as described above.  Patient clinically well-appearing in no acute distress.  Reassuring exam.  EKG is nonischemic.  Does have a history of diabetes therefore will order serial enzymes and observe on cardiac monitor.  He is low risk by Wells criteria  and is PERC negative.  This does not seem consistent with dissection.  Describing dizziness sensation therefore will give a little bit of fluid in case he may have been a little bit dehydrated.  Clinical Course as of 09/12/20 1240  Thu Sep 12, 2020  1224 On reassessment patient well-appearing in no acute distress.  Asking for something to drink.  Repeat troponins are negative.  This does not seem consistent with cardiac etiology.  Remains hemodynamically stable.  This not consistent with dissection.  Does have reproducible tenderness palpation of the posterior aspect of the left shoulder without any effusion cellulitis.  Neuro intact.  Splendidly patient stable appropriate for outpatient follow-up. [PR]    Clinical Course User Index [PR] Willy Eddy, MD    The patient was evaluated in Emergency Department today for the symptoms described in the history of present illness. He/she was evaluated in the context of  the global COVID-19 pandemic, which necessitated consideration that the patient might be at risk for infection with the SARS-CoV-2 virus that causes COVID-19. Institutional protocols and algorithms that pertain to the evaluation of patients at risk for COVID-19 are in a state of rapid change based on information released by regulatory bodies including the CDC and federal and state organizations. These policies and algorithms were followed during the patient's care in the ED.  As part of my medical decision making, I reviewed the following data within the electronic MEDICAL RECORD NUMBER Nursing notes reviewed and incorporated, Labs reviewed, notes from prior ED visits and [place] Controlled Substance Database   ____________________________________________   FINAL CLINICAL IMPRESSION(S) / ED DIAGNOSES  Final diagnoses:  Left shoulder pain, unspecified chronicity  Dizziness      NEW MEDICATIONS STARTED DURING THIS VISIT:  New Prescriptions   CYCLOBENZAPRINE (FLEXERIL) 5 MG TABLET     Take 1 tablet (5 mg total) by mouth 3 (three) times daily as needed for muscle spasms.     Note:  This document was prepared using Dragon voice recognition software and may include unintentional dictation errors.    Willy Eddy, MD 09/12/20 1240

## 2020-09-12 NOTE — ED Triage Notes (Signed)
Pt at work with sudden onset dizziness and shortness of breath. Denies chest pain but states he did have a pain at the base of his posterior neck spreading across his shoulders.

## 2020-09-12 NOTE — ED Notes (Signed)
Pt discharged, reviewed d/c instructions - d/c to home, ambulatory out of department

## 2020-12-13 ENCOUNTER — Other Ambulatory Visit: Payer: Self-pay

## 2020-12-13 ENCOUNTER — Emergency Department
Admission: EM | Admit: 2020-12-13 | Discharge: 2020-12-13 | Disposition: A | Discharge status: Home / Self Care | Payer: Commercial Managed Care - HMO | Attending: Emergency Medicine | Admitting: Emergency Medicine

## 2020-12-13 ENCOUNTER — Encounter: Payer: Self-pay | Admitting: Emergency Medicine

## 2020-12-13 DIAGNOSIS — M79671 Pain in right foot: Secondary | ICD-10-CM | POA: Insufficient documentation

## 2020-12-13 DIAGNOSIS — Z5321 Procedure and treatment not carried out due to patient leaving prior to being seen by health care provider: Secondary | ICD-10-CM | POA: Insufficient documentation

## 2020-12-13 LAB — CBC WITH DIFFERENTIAL/PLATELET
Abs Immature Granulocytes: 0.04 10*3/uL (ref 0.00–0.07)
Basophils Absolute: 0 10*3/uL (ref 0.0–0.1)
Basophils Relative: 0 %
Eosinophils Absolute: 0.1 10*3/uL (ref 0.0–0.5)
Eosinophils Relative: 1 %
HCT: 36.1 % — ABNORMAL LOW (ref 39.0–52.0)
Hemoglobin: 12.5 g/dL — ABNORMAL LOW (ref 13.0–17.0)
Immature Granulocytes: 0 %
Lymphocytes Relative: 30 %
Lymphs Abs: 3.3 10*3/uL (ref 0.7–4.0)
MCH: 28 pg (ref 26.0–34.0)
MCHC: 34.6 g/dL (ref 30.0–36.0)
MCV: 80.9 fL (ref 80.0–100.0)
Monocytes Absolute: 0.8 10*3/uL (ref 0.1–1.0)
Monocytes Relative: 8 %
Neutro Abs: 6.8 10*3/uL (ref 1.7–7.7)
Neutrophils Relative %: 61 %
Platelets: 484 10*3/uL — ABNORMAL HIGH (ref 150–400)
RBC: 4.46 MIL/uL (ref 4.22–5.81)
RDW: 12.3 % (ref 11.5–15.5)
WBC: 11.1 10*3/uL — ABNORMAL HIGH (ref 4.0–10.5)
nRBC: 0 % (ref 0.0–0.2)

## 2020-12-13 LAB — BASIC METABOLIC PANEL
Anion gap: 7 (ref 5–15)
BUN: 26 mg/dL — ABNORMAL HIGH (ref 6–20)
CO2: 29 mmol/L (ref 22–32)
Calcium: 9.4 mg/dL (ref 8.9–10.3)
Chloride: 92 mmol/L — ABNORMAL LOW (ref 98–111)
Creatinine, Ser: 1.39 mg/dL — ABNORMAL HIGH (ref 0.61–1.24)
GFR, Estimated: >60 mL/min (ref >60)
Glucose, Bld: 469 mg/dL — ABNORMAL HIGH (ref 70–99)
Potassium: 5 mmol/L (ref 3.5–5.1)
Sodium: 128 mmol/L — ABNORMAL LOW (ref 135–145)

## 2020-12-13 NOTE — ED Triage Notes (Signed)
Pt reports that Sunday he noticed a puddle of blood on his right foot. He reports that he had surgery on that foot and the blood was coming of the surgery site. (He had surgery on the right foot a few years ago. The last few days he has been changing his bandages and noticed that he now has purulent drainage and a foul smell. He noticed that he also has 2 lumps on his right groin.

## 2021-01-13 ENCOUNTER — Emergency Department: Payer: Self-pay

## 2021-01-13 ENCOUNTER — Emergency Department
Admission: EM | Admit: 2021-01-13 | Discharge: 2021-01-13 | Disposition: A | Discharge status: Home / Self Care | Payer: Self-pay | Attending: Emergency Medicine | Admitting: Emergency Medicine

## 2021-01-13 ENCOUNTER — Other Ambulatory Visit: Payer: Self-pay

## 2021-01-13 DIAGNOSIS — E162 Hypoglycemia, unspecified: Secondary | ICD-10-CM

## 2021-01-13 DIAGNOSIS — Z87891 Personal history of nicotine dependence: Secondary | ICD-10-CM | POA: Insufficient documentation

## 2021-01-13 DIAGNOSIS — I10 Essential (primary) hypertension: Secondary | ICD-10-CM | POA: Insufficient documentation

## 2021-01-13 DIAGNOSIS — E11649 Type 2 diabetes mellitus with hypoglycemia without coma: Secondary | ICD-10-CM | POA: Insufficient documentation

## 2021-01-13 LAB — COMPREHENSIVE METABOLIC PANEL
ALT: 18 U/L (ref 0–44)
AST: 20 U/L (ref 15–41)
Albumin: 3.6 g/dL (ref 3.5–5.0)
Alkaline Phosphatase: 49 U/L (ref 38–126)
Anion gap: 10 (ref 5–15)
BUN: 25 mg/dL — ABNORMAL HIGH (ref 6–20)
CO2: 27 mmol/L (ref 22–32)
Calcium: 9.4 mg/dL (ref 8.9–10.3)
Chloride: 102 mmol/L (ref 98–111)
Creatinine, Ser: 1.27 mg/dL — ABNORMAL HIGH (ref 0.61–1.24)
GFR, Estimated: >60 mL/min (ref >60)
Glucose, Bld: 154 mg/dL — ABNORMAL HIGH (ref 70–99)
Potassium: 3.3 mmol/L — ABNORMAL LOW (ref 3.5–5.1)
Sodium: 139 mmol/L (ref 135–145)
Total Bilirubin: 0.7 mg/dL (ref 0.3–1.2)
Total Protein: 7 g/dL (ref 6.5–8.1)

## 2021-01-13 LAB — CBC WITH DIFFERENTIAL/PLATELET
Abs Immature Granulocytes: 0.02 10*3/uL (ref 0.00–0.07)
Basophils Absolute: 0 10*3/uL (ref 0.0–0.1)
Basophils Relative: 0 %
Eosinophils Absolute: 0.1 10*3/uL (ref 0.0–0.5)
Eosinophils Relative: 1 %
HCT: 34.4 % — ABNORMAL LOW (ref 39.0–52.0)
Hemoglobin: 11.6 g/dL — ABNORMAL LOW (ref 13.0–17.0)
Immature Granulocytes: 0 %
Lymphocytes Relative: 33 %
Lymphs Abs: 3.3 10*3/uL (ref 0.7–4.0)
MCH: 28.3 pg (ref 26.0–34.0)
MCHC: 33.7 g/dL (ref 30.0–36.0)
MCV: 83.9 fL (ref 80.0–100.0)
Monocytes Absolute: 0.7 10*3/uL (ref 0.1–1.0)
Monocytes Relative: 7 %
Neutro Abs: 5.8 10*3/uL (ref 1.7–7.7)
Neutrophils Relative %: 59 %
Platelets: 332 10*3/uL (ref 150–400)
RBC: 4.1 MIL/uL — ABNORMAL LOW (ref 4.22–5.81)
RDW: 12.9 % (ref 11.5–15.5)
WBC: 9.9 10*3/uL (ref 4.0–10.5)
nRBC: 0 % (ref 0.0–0.2)

## 2021-01-13 LAB — CBG MONITORING, ED
Glucose-Capillary: 186 mg/dL — ABNORMAL HIGH (ref 70–99)
Glucose-Capillary: 191 mg/dL — ABNORMAL HIGH (ref 70–99)
Glucose-Capillary: 171 mg/dL — ABNORMAL HIGH (ref 70–99)

## 2021-01-13 NOTE — ED Provider Notes (Signed)
[facility] Emergency Department Provider Note   ____________________________________________   Event Date/Time   First MD Initiated Contact with Patient 01/13/21 1749     (approximate)  I have reviewed the triage vital signs and the nursing notes.   HISTORY  Chief Complaint Hypoglycemia    HPI Gabriel Shaw is a 50 y.o. male with a past history of diabetes who reports he has not been as hungry lately not been eating as much and been losing a little bit of weight.  He is still taking the same amount of insulin however.  He has had for 5 episodes of hypoglycemia in the last few weeks.  He had one today when he gave himself his premeal rapid acting insulin and then went to the restaurant but did not get to eat right away became sweaty and hypoglycemic with a blood sugar in the 40s.  EMS was called gave him some D10 IV and his blood sugar is now 186.  He is feeling better.  He denies any other medical problems except for some foot ulcers which are being worked on.    Past Medical History:  Diagnosis Date   Diabetes mellitus without complication (HCC)    Hypertension     There are no problems to display for this patient.   No past surgical history on file.  Prior to Admission medications   Medication Sig Start Date End Date Taking? Authorizing Provider  cyclobenzaprine (FLEXERIL) 5 MG tablet Take 1 tablet (5 mg total) by mouth 3 (three) times daily as needed for muscle spasms. 09/12/20   Willy Eddy, MD    Allergies Patient has no known allergies.  No family history on file.  Social History Social History   Tobacco Use   Smoking status: Former   Smokeless tobacco: Never  Substance Use Topics   Alcohol use: No   Drug use: No    Review of Systems  Constitutional: No fever/chills Eyes: No visual changes. ENT: No sore throat. Cardiovascular: Denies chest pain. Respiratory: Denies shortness of breath. Gastrointestinal: No abdominal  pain.  No nausea, no vomiting.  No diarrhea.  No constipation. Genitourinary: Negative for dysuria. Musculoskeletal: Negative for back pain. Skin: Negative for rash. Neurological: Negative for headaches, focal weakness   ____________________________________________   PHYSICAL EXAM:  VITAL SIGNS: ED Triage Vitals  Enc Vitals Group     BP 01/13/21 1745 118/76     Pulse Rate 01/13/21 1745 76     Resp 01/13/21 1745 20     Temp 01/13/21 1745 (!) 97.5 F (36.4 C)     Temp Source 01/13/21 1745 Oral     SpO2 01/13/21 1745 100 %     Weight 01/13/21 1743 210 lb (95.3 kg)     Height 01/13/21 1743 6' (1.829 m)     Head Circumference --      Peak Flow --      Pain Score 01/13/21 1742 0     Pain Loc --      Pain Edu? --      Excl. in GC? --     Constitutional: Alert and oriented. Well appearing and in no acute distress.  His shirt is soaked in sweat because he was very sweaty when his blood sugar was low Eyes: Conjunctivae are normal.  Head: Atraumatic. Nose: No congestion/rhinnorhea. Mouth/Throat: Mucous membranes are moist.  Oropharynx non-erythematous. Neck: No stridor. Cardiovascular: Normal rate, regular rhythm. Grossly normal heart sounds.  Good peripheral circulation. Respiratory: Normal respiratory  effort.  No retractions. Lungs CTAB. Gastrointestinal: Soft and nontender. No distention. No abdominal bruits. No CVA tenderness. Musculoskeletal: No lower extremity tenderness nor edema.  Neurologic:  Normal speech and language. No gross focal neurologic deficits are appreciated.  Skin:  Skin is warm, dry and intact. No rash noted.   ____________________________________________   LABS (all labs ordered are listed, but only abnormal results are displayed)  Labs Reviewed  COMPREHENSIVE METABOLIC PANEL - Abnormal; Notable for the following components:      Result Value   Potassium 3.3 (*)    Glucose, Bld 154 (*)    BUN 25 (*)    Creatinine, Ser 1.27 (*)    All other  components within normal limits  CBC WITH DIFFERENTIAL/PLATELET - Abnormal; Notable for the following components:   RBC 4.10 (*)    Hemoglobin 11.6 (*)    HCT 34.4 (*)    All other components within normal limits  CBG MONITORING, ED - Abnormal; Notable for the following components:   Glucose-Capillary 186 (*)    All other components within normal limits  CBG MONITORING, ED - Abnormal; Notable for the following components:   Glucose-Capillary 191 (*)    All other components within normal limits  CBG MONITORING, ED - Abnormal; Notable for the following components:   Glucose-Capillary 171 (*)    All other components within normal limits   ____________________________________________  EKG  EKG read interpreted by me shows normal sinus rhythm rate of 76 normal axis no acute ST-T wave changes ____________________________________________  RADIOLOGY Jill Poling, personally viewed and evaluated these images (plain radiographs) as part of my medical decision making, as well as reviewing the written report by the radiologist.  ED MD interpretation: Chest x-ray read by radiology reviewed by me shows no active disease  Official radiology report(s): DG Chest Portable 1 View  Result Date: 01/13/2021 CLINICAL DATA:  Weakness, hyperglycemia EXAM: PORTABLE CHEST 1 VIEW COMPARISON:  None. FINDINGS: The heart size and mediastinal contours are within normal limits. Both lungs are clear. The visualized skeletal structures are unremarkable. IMPRESSION: No active disease. Electronically Signed   By: Helyn Numbers MD   On: 01/13/2021 18:27    ____________________________________________   PROCEDURES  Procedure(s) performed (including Critical Care):  Procedures   ____________________________________________   INITIAL IMPRESSION / ASSESSMENT AND PLAN / ED COURSE    Patient doing well his blood sugars been stable because of his weight loss and episodes of hypoglycemia I will have him  follow-up with his regular doctor and in the meantime decrease his insulin dose slightly.  There does not seem to be any other reason for his blood sugar to be dropping besides his weight loss and not eating as much.  His primary care doctor may need to investigate his weight loss if it continues.          ____________________________________________   FINAL CLINICAL IMPRESSION(S) / ED DIAGNOSES  Final diagnoses:  Hypoglycemia     ED Discharge Orders     None        Note:  This document was prepared using Dragon voice recognition software and may include unintentional dictation errors.    Arnaldo Natal, MD 01/13/21 1944

## 2021-01-13 NOTE — Discharge Instructions (Addendum)
Please follow-up with your regular doctor.  Have them review your insulin dose.  Until such time as you can do that I would decrease her long-acting insulin dose by 5 units both in the morning and the evening and decrease your Premeal dose by 3 units before every meal.  Please return here for any further episodes of low or high blood sugar.  Blood sugar over 350 is something that we should check on in the emergency room.

## 2021-01-13 NOTE — ED Notes (Signed)
Pt provided sandwich tray, ok per dr Darnelle Catalan

## 2021-01-13 NOTE — ED Notes (Signed)
Pt provided warm blanket.

## 2021-01-13 NOTE — ED Triage Notes (Signed)
[place] to [place] [facility] [place] for hypoglycemia cbg 42 on arrival, ems gave d10 through 20 R AC.  Wound to right foot, cast on  Pt took 15 units insulin prior to going to [place]  Cbg 186 on arrival  Dr Darnelle Catalan at bedside

## 2021-11-22 IMAGING — CR DG CHEST 2V
1 series · 2 of 2 positions shown · non-contrast
Comparison: 07/13/2017

CLINICAL DATA: History of pneumonia

EXAM:
CHEST - 2 VIEW

[Series 1: dg chest 2 view · 0.14mm/px · 2 of 2 slices shown]
[im 1/2]
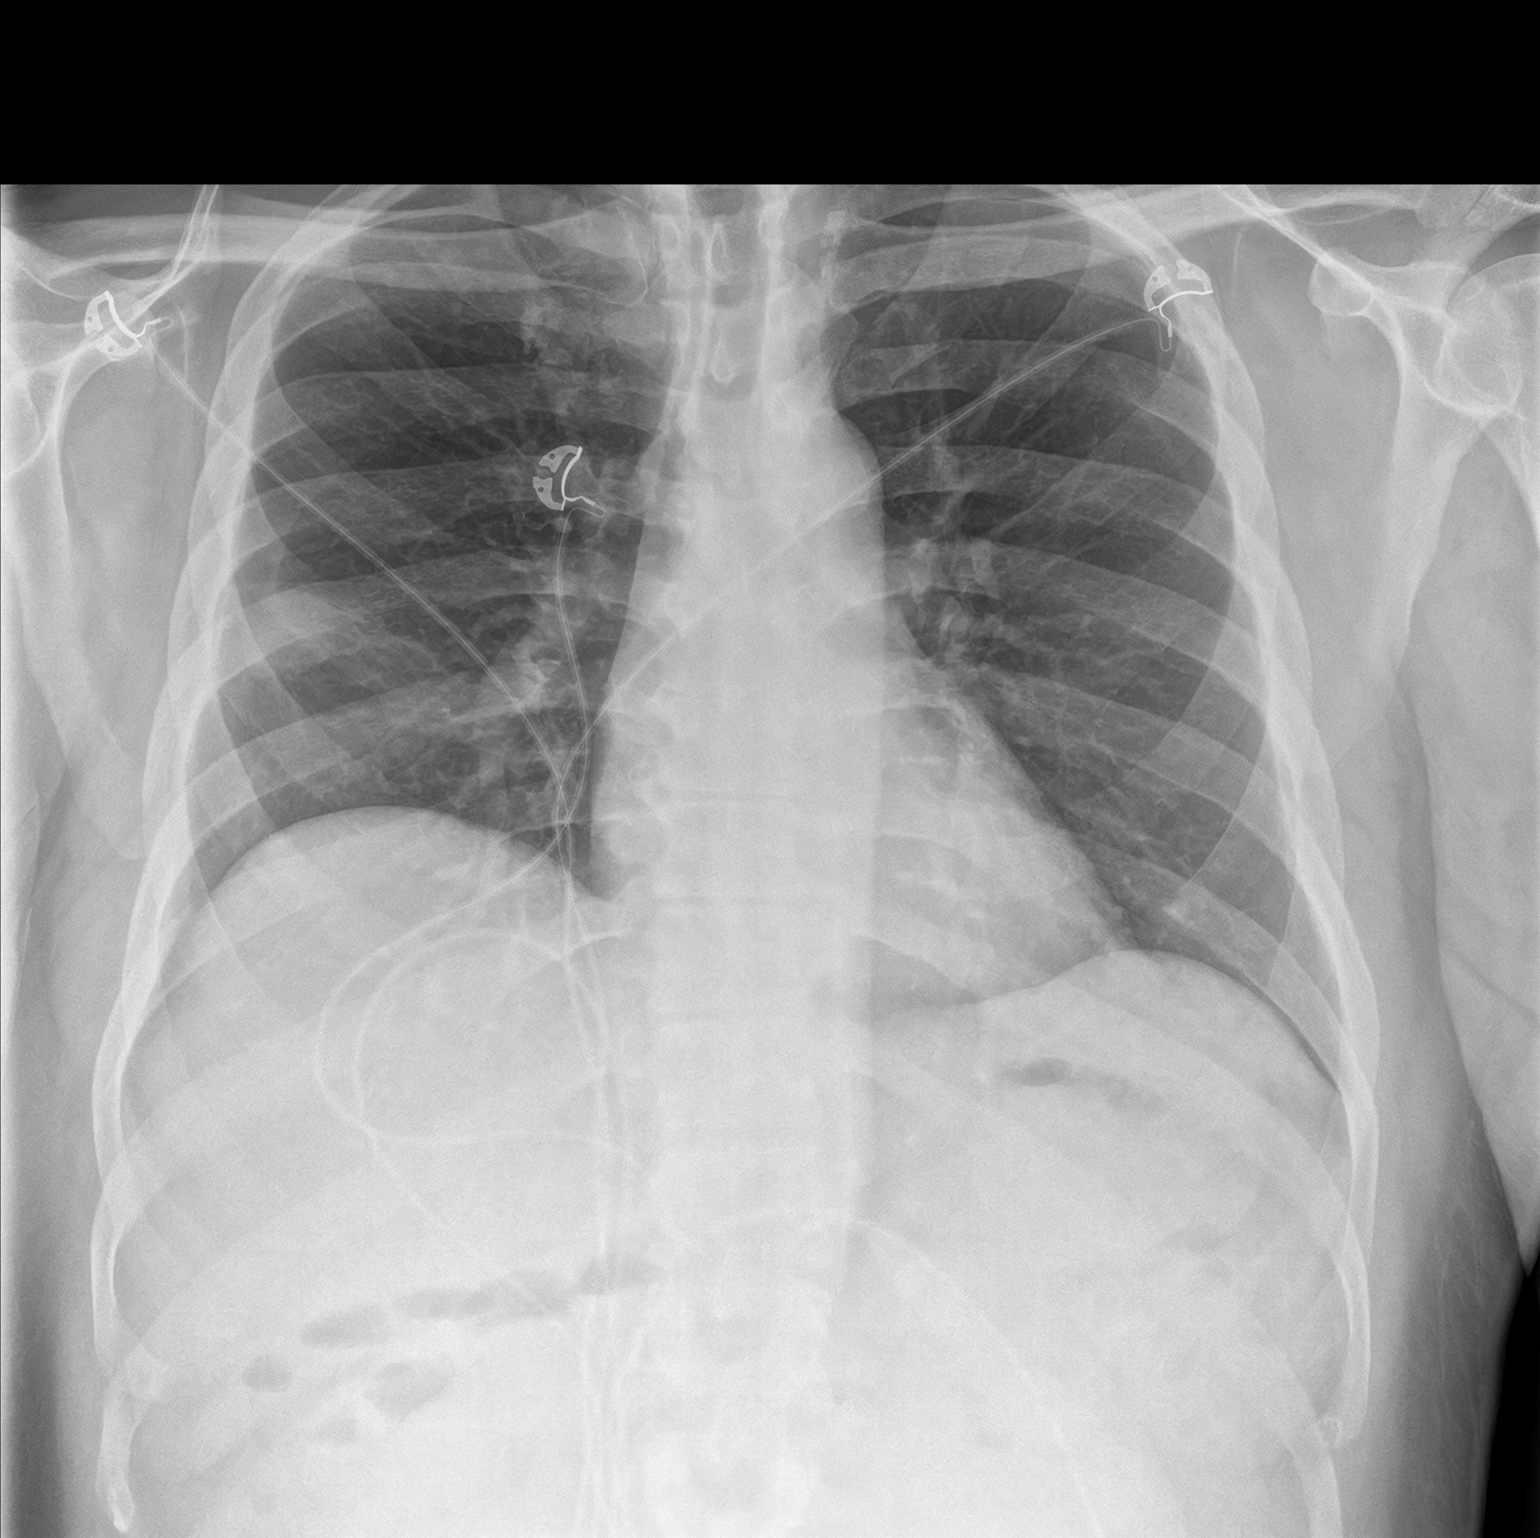
[im 2/2]
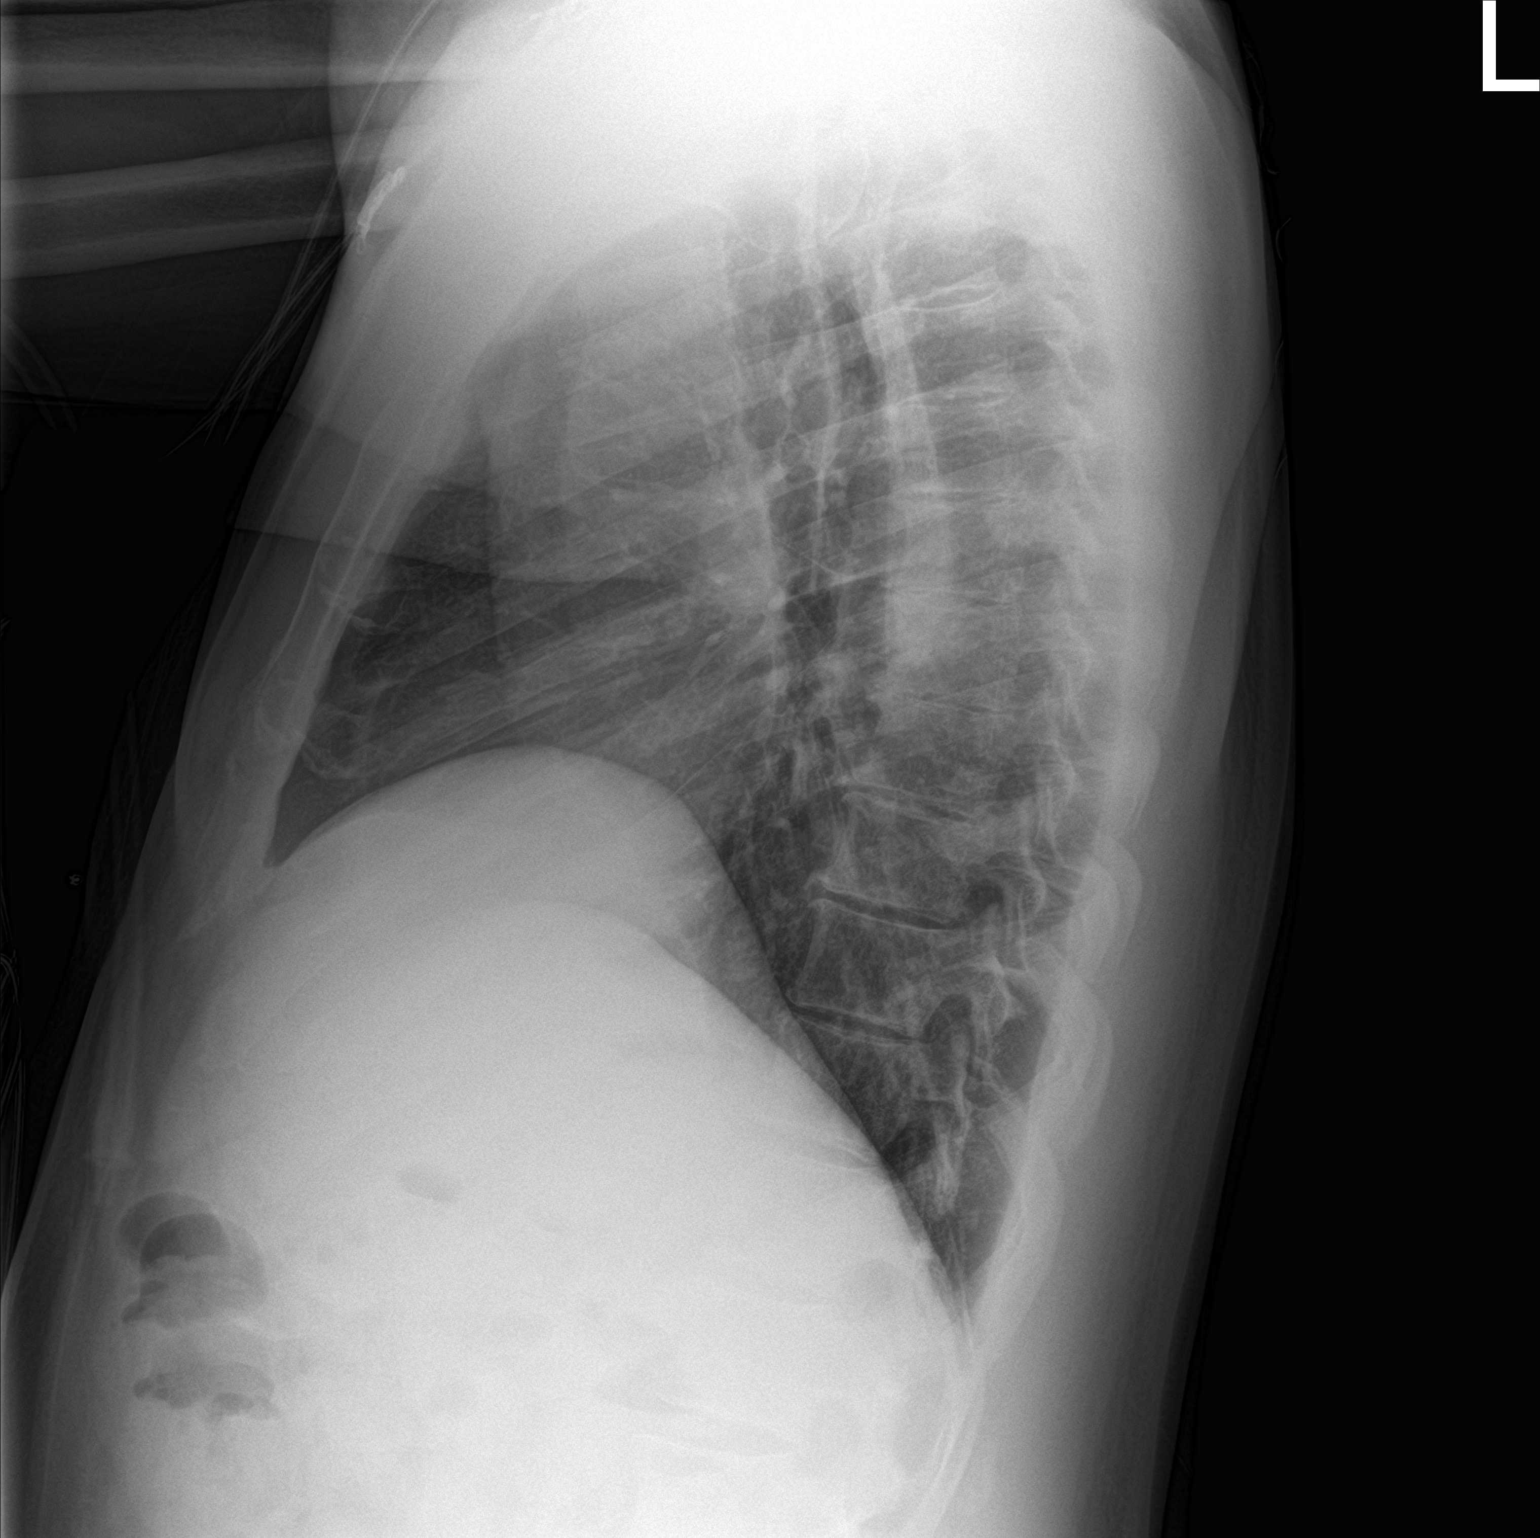

[2 of 2 positions shown; findings below may reference images not displayed]

FINDINGS: Vague right lower lung opacity. No pleural effusion. Normal heart
size. No pneumothorax.
IMPRESSION: Vague slightly nodular right lower lung opacity, possibly
representing a pneumonia though lung nodule is also possible. Short
interval radiographic follow-up following therapy is recommended to
ensure resolution.

## 2022-08-28 DIAGNOSIS — Z419 Encounter for procedure for purposes other than remedying health state, unspecified: Secondary | ICD-10-CM | POA: Diagnosis not present

## 2022-09-01 DIAGNOSIS — S90424A Blister (nonthermal), right lesser toe(s), initial encounter: Secondary | ICD-10-CM | POA: Diagnosis not present

## 2022-09-01 DIAGNOSIS — L97512 Non-pressure chronic ulcer of other part of right foot with fat layer exposed: Secondary | ICD-10-CM | POA: Diagnosis not present

## 2022-09-01 DIAGNOSIS — E1169 Type 2 diabetes mellitus with other specified complication: Secondary | ICD-10-CM | POA: Diagnosis not present

## 2022-09-01 DIAGNOSIS — M1460 Charcot's joint, unspecified site: Secondary | ICD-10-CM | POA: Diagnosis not present

## 2022-09-01 DIAGNOSIS — Z794 Long term (current) use of insulin: Secondary | ICD-10-CM | POA: Diagnosis not present

## 2022-09-09 DIAGNOSIS — I739 Peripheral vascular disease, unspecified: Secondary | ICD-10-CM | POA: Diagnosis not present

## 2022-09-09 DIAGNOSIS — E785 Hyperlipidemia, unspecified: Secondary | ICD-10-CM | POA: Diagnosis not present

## 2022-09-09 DIAGNOSIS — E1142 Type 2 diabetes mellitus with diabetic polyneuropathy: Secondary | ICD-10-CM | POA: Diagnosis not present

## 2022-09-09 DIAGNOSIS — I10 Essential (primary) hypertension: Secondary | ICD-10-CM | POA: Diagnosis not present

## 2022-09-09 DIAGNOSIS — Z794 Long term (current) use of insulin: Secondary | ICD-10-CM | POA: Diagnosis not present

## 2022-09-09 DIAGNOSIS — Z01810 Encounter for preprocedural cardiovascular examination: Secondary | ICD-10-CM | POA: Diagnosis not present

## 2022-09-09 DIAGNOSIS — E11610 Type 2 diabetes mellitus with diabetic neuropathic arthropathy: Secondary | ICD-10-CM | POA: Diagnosis not present

## 2022-09-09 DIAGNOSIS — S91301A Unspecified open wound, right foot, initial encounter: Secondary | ICD-10-CM | POA: Diagnosis not present

## 2022-09-09 DIAGNOSIS — E1151 Type 2 diabetes mellitus with diabetic peripheral angiopathy without gangrene: Secondary | ICD-10-CM | POA: Diagnosis not present

## 2022-09-23 DIAGNOSIS — E1140 Type 2 diabetes mellitus with diabetic neuropathy, unspecified: Secondary | ICD-10-CM | POA: Diagnosis not present

## 2022-09-23 DIAGNOSIS — Z79899 Other long term (current) drug therapy: Secondary | ICD-10-CM | POA: Diagnosis not present

## 2022-09-23 DIAGNOSIS — E785 Hyperlipidemia, unspecified: Secondary | ICD-10-CM | POA: Diagnosis not present

## 2022-09-23 DIAGNOSIS — Z794 Long term (current) use of insulin: Secondary | ICD-10-CM | POA: Diagnosis not present

## 2022-09-23 DIAGNOSIS — Z7984 Long term (current) use of oral hypoglycemic drugs: Secondary | ICD-10-CM | POA: Diagnosis not present

## 2022-09-23 DIAGNOSIS — Z87891 Personal history of nicotine dependence: Secondary | ICD-10-CM | POA: Diagnosis not present

## 2022-09-23 DIAGNOSIS — L97511 Non-pressure chronic ulcer of other part of right foot limited to breakdown of skin: Secondary | ICD-10-CM | POA: Diagnosis not present

## 2022-09-23 DIAGNOSIS — Z7982 Long term (current) use of aspirin: Secondary | ICD-10-CM | POA: Diagnosis not present

## 2022-09-23 DIAGNOSIS — E11610 Type 2 diabetes mellitus with diabetic neuropathic arthropathy: Secondary | ICD-10-CM | POA: Diagnosis not present

## 2022-09-23 DIAGNOSIS — N189 Chronic kidney disease, unspecified: Secondary | ICD-10-CM | POA: Diagnosis not present

## 2022-09-23 DIAGNOSIS — E11621 Type 2 diabetes mellitus with foot ulcer: Secondary | ICD-10-CM | POA: Diagnosis not present

## 2022-09-23 DIAGNOSIS — L97512 Non-pressure chronic ulcer of other part of right foot with fat layer exposed: Secondary | ICD-10-CM | POA: Diagnosis not present

## 2022-09-23 DIAGNOSIS — L97519 Non-pressure chronic ulcer of other part of right foot with unspecified severity: Secondary | ICD-10-CM | POA: Diagnosis not present

## 2022-09-23 DIAGNOSIS — I999 Unspecified disorder of circulatory system: Secondary | ICD-10-CM | POA: Diagnosis not present

## 2022-09-23 DIAGNOSIS — E1122 Type 2 diabetes mellitus with diabetic chronic kidney disease: Secondary | ICD-10-CM | POA: Diagnosis not present

## 2022-09-28 DIAGNOSIS — Z419 Encounter for procedure for purposes other than remedying health state, unspecified: Secondary | ICD-10-CM | POA: Diagnosis not present

## 2022-10-06 DIAGNOSIS — Z7689 Persons encountering health services in other specified circumstances: Secondary | ICD-10-CM | POA: Diagnosis not present

## 2022-10-06 DIAGNOSIS — L97512 Non-pressure chronic ulcer of other part of right foot with fat layer exposed: Secondary | ICD-10-CM | POA: Diagnosis not present

## 2022-10-13 DIAGNOSIS — Z7689 Persons encountering health services in other specified circumstances: Secondary | ICD-10-CM | POA: Diagnosis not present

## 2022-10-14 DIAGNOSIS — I739 Peripheral vascular disease, unspecified: Secondary | ICD-10-CM | POA: Diagnosis not present

## 2022-10-14 DIAGNOSIS — Z7689 Persons encountering health services in other specified circumstances: Secondary | ICD-10-CM | POA: Diagnosis not present

## 2022-10-16 DIAGNOSIS — M1460 Charcot's joint, unspecified site: Secondary | ICD-10-CM | POA: Diagnosis not present

## 2022-10-16 DIAGNOSIS — S90822A Blister (nonthermal), left foot, initial encounter: Secondary | ICD-10-CM | POA: Diagnosis not present

## 2022-10-16 DIAGNOSIS — E1165 Type 2 diabetes mellitus with hyperglycemia: Secondary | ICD-10-CM | POA: Diagnosis not present

## 2022-10-16 DIAGNOSIS — Z794 Long term (current) use of insulin: Secondary | ICD-10-CM | POA: Diagnosis not present

## 2022-10-16 DIAGNOSIS — Z7689 Persons encountering health services in other specified circumstances: Secondary | ICD-10-CM | POA: Diagnosis not present

## 2022-10-26 DIAGNOSIS — Z7689 Persons encountering health services in other specified circumstances: Secondary | ICD-10-CM | POA: Diagnosis not present

## 2022-10-26 DIAGNOSIS — L97519 Non-pressure chronic ulcer of other part of right foot with unspecified severity: Secondary | ICD-10-CM | POA: Diagnosis not present

## 2022-10-28 DIAGNOSIS — Z419 Encounter for procedure for purposes other than remedying health state, unspecified: Secondary | ICD-10-CM | POA: Diagnosis not present

## 2022-11-03 DIAGNOSIS — Z7689 Persons encountering health services in other specified circumstances: Secondary | ICD-10-CM | POA: Diagnosis not present

## 2022-11-03 DIAGNOSIS — Z1389 Encounter for screening for other disorder: Secondary | ICD-10-CM | POA: Diagnosis not present

## 2022-11-03 DIAGNOSIS — Z1331 Encounter for screening for depression: Secondary | ICD-10-CM | POA: Diagnosis not present

## 2022-11-03 DIAGNOSIS — E1142 Type 2 diabetes mellitus with diabetic polyneuropathy: Secondary | ICD-10-CM | POA: Diagnosis not present

## 2022-11-03 DIAGNOSIS — Z0000 Encounter for general adult medical examination without abnormal findings: Secondary | ICD-10-CM | POA: Diagnosis not present

## 2022-11-03 DIAGNOSIS — F331 Major depressive disorder, recurrent, moderate: Secondary | ICD-10-CM | POA: Diagnosis not present

## 2022-11-03 DIAGNOSIS — Z125 Encounter for screening for malignant neoplasm of prostate: Secondary | ICD-10-CM | POA: Diagnosis not present

## 2022-11-09 DIAGNOSIS — L97519 Non-pressure chronic ulcer of other part of right foot with unspecified severity: Secondary | ICD-10-CM | POA: Diagnosis not present

## 2022-11-09 DIAGNOSIS — Z7689 Persons encountering health services in other specified circumstances: Secondary | ICD-10-CM | POA: Diagnosis not present

## 2022-11-26 DIAGNOSIS — Z7689 Persons encountering health services in other specified circumstances: Secondary | ICD-10-CM | POA: Diagnosis not present

## 2022-11-26 DIAGNOSIS — Z794 Long term (current) use of insulin: Secondary | ICD-10-CM | POA: Diagnosis not present

## 2022-11-26 DIAGNOSIS — E113511 Type 2 diabetes mellitus with proliferative diabetic retinopathy with macular edema, right eye: Secondary | ICD-10-CM | POA: Diagnosis not present

## 2022-11-27 DIAGNOSIS — Z794 Long term (current) use of insulin: Secondary | ICD-10-CM | POA: Diagnosis not present

## 2022-11-27 DIAGNOSIS — L97519 Non-pressure chronic ulcer of other part of right foot with unspecified severity: Secondary | ICD-10-CM | POA: Diagnosis not present

## 2022-11-27 DIAGNOSIS — E1165 Type 2 diabetes mellitus with hyperglycemia: Secondary | ICD-10-CM | POA: Diagnosis not present

## 2022-11-27 DIAGNOSIS — Z7689 Persons encountering health services in other specified circumstances: Secondary | ICD-10-CM | POA: Diagnosis not present

## 2022-11-28 DIAGNOSIS — Z419 Encounter for procedure for purposes other than remedying health state, unspecified: Secondary | ICD-10-CM | POA: Diagnosis not present

## 2022-12-04 DIAGNOSIS — Z7689 Persons encountering health services in other specified circumstances: Secondary | ICD-10-CM | POA: Diagnosis not present

## 2022-12-23 DIAGNOSIS — H4311 Vitreous hemorrhage, right eye: Secondary | ICD-10-CM | POA: Diagnosis not present

## 2022-12-23 DIAGNOSIS — E113531 Type 2 diabetes mellitus with proliferative diabetic retinopathy with traction retinal detachment not involving the macula, right eye: Secondary | ICD-10-CM | POA: Diagnosis not present

## 2022-12-23 DIAGNOSIS — Z794 Long term (current) use of insulin: Secondary | ICD-10-CM | POA: Diagnosis not present

## 2022-12-23 DIAGNOSIS — E113592 Type 2 diabetes mellitus with proliferative diabetic retinopathy without macular edema, left eye: Secondary | ICD-10-CM | POA: Diagnosis not present

## 2022-12-24 DIAGNOSIS — E1165 Type 2 diabetes mellitus with hyperglycemia: Secondary | ICD-10-CM | POA: Diagnosis not present

## 2022-12-24 DIAGNOSIS — M79672 Pain in left foot: Secondary | ICD-10-CM | POA: Diagnosis not present

## 2022-12-24 DIAGNOSIS — Z7689 Persons encountering health services in other specified circumstances: Secondary | ICD-10-CM | POA: Diagnosis not present

## 2022-12-24 DIAGNOSIS — M14671 Charcot's joint, right ankle and foot: Secondary | ICD-10-CM | POA: Diagnosis not present

## 2022-12-24 DIAGNOSIS — Z794 Long term (current) use of insulin: Secondary | ICD-10-CM | POA: Diagnosis not present

## 2022-12-24 DIAGNOSIS — L97519 Non-pressure chronic ulcer of other part of right foot with unspecified severity: Secondary | ICD-10-CM | POA: Diagnosis not present

## 2022-12-28 DIAGNOSIS — Z419 Encounter for procedure for purposes other than remedying health state, unspecified: Secondary | ICD-10-CM | POA: Diagnosis not present

## 2022-12-29 DIAGNOSIS — Z7689 Persons encountering health services in other specified circumstances: Secondary | ICD-10-CM | POA: Diagnosis not present

## 2022-12-29 DIAGNOSIS — D649 Anemia, unspecified: Secondary | ICD-10-CM | POA: Diagnosis not present

## 2023-01-07 DIAGNOSIS — E785 Hyperlipidemia, unspecified: Secondary | ICD-10-CM | POA: Diagnosis not present

## 2023-01-07 DIAGNOSIS — N189 Chronic kidney disease, unspecified: Secondary | ICD-10-CM | POA: Diagnosis not present

## 2023-01-07 DIAGNOSIS — Z122 Encounter for screening for malignant neoplasm of respiratory organs: Secondary | ICD-10-CM | POA: Diagnosis not present

## 2023-01-07 DIAGNOSIS — Z7982 Long term (current) use of aspirin: Secondary | ICD-10-CM | POA: Diagnosis not present

## 2023-01-07 DIAGNOSIS — E1122 Type 2 diabetes mellitus with diabetic chronic kidney disease: Secondary | ICD-10-CM | POA: Diagnosis not present

## 2023-01-07 DIAGNOSIS — Z79899 Other long term (current) drug therapy: Secondary | ICD-10-CM | POA: Diagnosis not present

## 2023-01-07 DIAGNOSIS — J439 Emphysema, unspecified: Secondary | ICD-10-CM | POA: Diagnosis not present

## 2023-01-07 DIAGNOSIS — Z87891 Personal history of nicotine dependence: Secondary | ICD-10-CM | POA: Diagnosis not present

## 2023-01-07 DIAGNOSIS — Z7689 Persons encountering health services in other specified circumstances: Secondary | ICD-10-CM | POA: Diagnosis not present

## 2023-01-07 DIAGNOSIS — Z7985 Long-term (current) use of injectable non-insulin antidiabetic drugs: Secondary | ICD-10-CM | POA: Diagnosis not present

## 2023-01-07 DIAGNOSIS — Z794 Long term (current) use of insulin: Secondary | ICD-10-CM | POA: Diagnosis not present

## 2023-01-08 DIAGNOSIS — M1460 Charcot's joint, unspecified site: Secondary | ICD-10-CM | POA: Diagnosis not present

## 2023-01-08 DIAGNOSIS — Z7689 Persons encountering health services in other specified circumstances: Secondary | ICD-10-CM | POA: Diagnosis not present

## 2023-01-08 DIAGNOSIS — L97519 Non-pressure chronic ulcer of other part of right foot with unspecified severity: Secondary | ICD-10-CM | POA: Diagnosis not present

## 2023-01-14 DIAGNOSIS — E113511 Type 2 diabetes mellitus with proliferative diabetic retinopathy with macular edema, right eye: Secondary | ICD-10-CM | POA: Diagnosis not present

## 2023-01-14 DIAGNOSIS — E113539 Type 2 diabetes mellitus with proliferative diabetic retinopathy with traction retinal detachment not involving the macula, unspecified eye: Secondary | ICD-10-CM | POA: Diagnosis not present

## 2023-01-14 DIAGNOSIS — Z794 Long term (current) use of insulin: Secondary | ICD-10-CM | POA: Diagnosis not present

## 2023-01-18 DIAGNOSIS — L97519 Non-pressure chronic ulcer of other part of right foot with unspecified severity: Secondary | ICD-10-CM | POA: Diagnosis not present

## 2023-01-28 DIAGNOSIS — Z419 Encounter for procedure for purposes other than remedying health state, unspecified: Secondary | ICD-10-CM | POA: Diagnosis not present

## 2023-01-31 ENCOUNTER — Emergency Department: Payer: Medicaid Other

## 2023-01-31 ENCOUNTER — Emergency Department
Admission: EM | Admit: 2023-01-31 | Discharge: 2023-01-31 | Disposition: A | Discharge status: Home / Self Care | Payer: Medicaid Other | Attending: Emergency Medicine | Admitting: Emergency Medicine

## 2023-01-31 ENCOUNTER — Other Ambulatory Visit: Payer: Self-pay

## 2023-01-31 DIAGNOSIS — K5900 Constipation, unspecified: Secondary | ICD-10-CM | POA: Diagnosis not present

## 2023-01-31 DIAGNOSIS — R739 Hyperglycemia, unspecified: Secondary | ICD-10-CM | POA: Diagnosis not present

## 2023-01-31 DIAGNOSIS — R112 Nausea with vomiting, unspecified: Secondary | ICD-10-CM | POA: Diagnosis not present

## 2023-01-31 DIAGNOSIS — E1165 Type 2 diabetes mellitus with hyperglycemia: Secondary | ICD-10-CM | POA: Insufficient documentation

## 2023-01-31 DIAGNOSIS — Z20822 Contact with and (suspected) exposure to covid-19: Secondary | ICD-10-CM | POA: Diagnosis not present

## 2023-01-31 DIAGNOSIS — N189 Chronic kidney disease, unspecified: Secondary | ICD-10-CM

## 2023-01-31 DIAGNOSIS — R531 Weakness: Secondary | ICD-10-CM | POA: Diagnosis not present

## 2023-01-31 DIAGNOSIS — R059 Cough, unspecified: Secondary | ICD-10-CM | POA: Insufficient documentation

## 2023-01-31 DIAGNOSIS — N200 Calculus of kidney: Secondary | ICD-10-CM | POA: Diagnosis not present

## 2023-01-31 DIAGNOSIS — R058 Other specified cough: Secondary | ICD-10-CM | POA: Diagnosis not present

## 2023-01-31 DIAGNOSIS — K5730 Diverticulosis of large intestine without perforation or abscess without bleeding: Secondary | ICD-10-CM | POA: Diagnosis not present

## 2023-01-31 DIAGNOSIS — R1030 Lower abdominal pain, unspecified: Secondary | ICD-10-CM | POA: Diagnosis not present

## 2023-01-31 DIAGNOSIS — I129 Hypertensive chronic kidney disease with stage 1 through stage 4 chronic kidney disease, or unspecified chronic kidney disease: Secondary | ICD-10-CM | POA: Diagnosis not present

## 2023-01-31 DIAGNOSIS — R823 Hemoglobinuria: Secondary | ICD-10-CM

## 2023-01-31 DIAGNOSIS — Z789 Other specified health status: Secondary | ICD-10-CM | POA: Diagnosis not present

## 2023-01-31 DIAGNOSIS — R1110 Vomiting, unspecified: Secondary | ICD-10-CM | POA: Diagnosis present

## 2023-01-31 LAB — BLOOD GAS, VENOUS
Acid-Base Excess: 9.1 mmol/L — ABNORMAL HIGH (ref 0.0–2.0)
Bicarbonate: 35.2 mmol/L — ABNORMAL HIGH (ref 20.0–28.0)
O2 Saturation: 44.9 %
Patient temperature: 37
pCO2, Ven: 53 mmHg (ref 44–60)
pH, Ven: 7.43 (ref 7.25–7.43)
pO2, Ven: <31 mmHg — CL (ref 32–45)

## 2023-01-31 LAB — CBC WITH DIFFERENTIAL/PLATELET
Abs Immature Granulocytes: 0.03 10*3/uL (ref 0.00–0.07)
Basophils Absolute: 0 10*3/uL (ref 0.0–0.1)
Basophils Relative: 0 %
Eosinophils Absolute: 0 10*3/uL (ref 0.0–0.5)
Eosinophils Relative: 0 %
HCT: 37.1 % — ABNORMAL LOW (ref 39.0–52.0)
Hemoglobin: 12.5 g/dL — ABNORMAL LOW (ref 13.0–17.0)
Immature Granulocytes: 0 %
Lymphocytes Relative: 17 %
Lymphs Abs: 1.7 10*3/uL (ref 0.7–4.0)
MCH: 27.9 pg (ref 26.0–34.0)
MCHC: 33.7 g/dL (ref 30.0–36.0)
MCV: 82.8 fL (ref 80.0–100.0)
Monocytes Absolute: 1.2 10*3/uL — ABNORMAL HIGH (ref 0.1–1.0)
Monocytes Relative: 12 %
Neutro Abs: 6.8 10*3/uL (ref 1.7–7.7)
Neutrophils Relative %: 71 %
Platelets: 239 10*3/uL (ref 150–400)
RBC: 4.48 MIL/uL (ref 4.22–5.81)
RDW: 13.3 % (ref 11.5–15.5)
WBC: 9.8 10*3/uL (ref 4.0–10.5)
nRBC: 0 % (ref 0.0–0.2)

## 2023-01-31 LAB — COMPREHENSIVE METABOLIC PANEL
ALT: 25 U/L (ref 0–44)
AST: 25 U/L (ref 15–41)
Albumin: 3 g/dL — ABNORMAL LOW (ref 3.5–5.0)
Alkaline Phosphatase: 66 U/L (ref 38–126)
Anion gap: 9 (ref 5–15)
BUN: 37 mg/dL — ABNORMAL HIGH (ref 6–20)
CO2: 27 mmol/L (ref 22–32)
Calcium: 8.3 mg/dL — ABNORMAL LOW (ref 8.9–10.3)
Chloride: 98 mmol/L (ref 98–111)
Creatinine, Ser: 1.96 mg/dL — ABNORMAL HIGH (ref 0.61–1.24)
GFR, Estimated: 41 mL/min — ABNORMAL LOW (ref >60)
Glucose, Bld: 493 mg/dL — ABNORMAL HIGH (ref 70–99)
Potassium: 3.9 mmol/L (ref 3.5–5.1)
Sodium: 134 mmol/L — ABNORMAL LOW (ref 135–145)
Total Bilirubin: 0.4 mg/dL (ref 0.3–1.2)
Total Protein: 7.1 g/dL (ref 6.5–8.1)

## 2023-01-31 LAB — URINALYSIS, ROUTINE W REFLEX MICROSCOPIC
Bacteria, UA: NONE SEEN
Bilirubin Urine: NEGATIVE
Glucose, UA: >=500 mg/dL — AB
Ketones, ur: NEGATIVE mg/dL
Leukocytes,Ua: NEGATIVE
Nitrite: NEGATIVE
Protein, ur: 100 mg/dL — AB
Specific Gravity, Urine: 1.044 — ABNORMAL HIGH (ref 1.005–1.030)
Squamous Epithelial / HPF: NONE SEEN /HPF (ref 0–5)
pH: 5 (ref 5.0–8.0)

## 2023-01-31 LAB — LIPASE, BLOOD: Lipase: 31 U/L (ref 11–51)

## 2023-01-31 LAB — BETA-HYDROXYBUTYRIC ACID: Beta-Hydroxybutyric Acid: 0.11 mmol/L (ref 0.05–0.27)

## 2023-01-31 LAB — SARS CORONAVIRUS 2 BY RT PCR: SARS Coronavirus 2 by RT PCR: NEGATIVE

## 2023-01-31 LAB — CBG MONITORING, ED: Glucose-Capillary: 437 mg/dL — ABNORMAL HIGH (ref 70–99)

## 2023-01-31 MED ORDER — IOHEXOL 350 MG/ML SOLN
80.0000 mL | Freq: Once | INTRAVENOUS | Status: AC | PRN
  Administered 2023-01-31: 80 mL via INTRAVENOUS

## 2023-01-31 MED ORDER — ONDANSETRON HCL 4 MG/2ML IJ SOLN
4.0000 mg | Freq: Once | INTRAMUSCULAR | Status: AC
  Administered 2023-01-31: 4 mg via INTRAVENOUS
  Filled 2023-01-31: qty 2

## 2023-01-31 MED ORDER — LACTATED RINGERS IV BOLUS
1000.0000 mL | Freq: Once | INTRAVENOUS | Status: AC
  Administered 2023-01-31: 1000 mL via INTRAVENOUS

## 2023-01-31 NOTE — ED Triage Notes (Signed)
Pt is diabetic and insulin was changed 3 weeks ago. Pt has had decreased PO intake since Monday along with constipation and nausea and weakness. Blood sugar on arrival was 434.

## 2023-01-31 NOTE — ED Notes (Addendum)
Went over d/c paperwork at this time with patient. Pt had no questions, comments or concerns after review and verbally understood them.

## 2023-01-31 NOTE — ED Provider Notes (Signed)
-----------------------------------------   7:05 AM on 01/31/2023 -----------------------------------------  Assuming care from Dr. Katrinka Blazing.  In short, MAXAMILLION BANAS is a 52 y.o. male with a chief complaint of hyperglycemia.  Refer to the original H&P for additional details.  The current plan of care is to follow-up on urinalysis and COVID test and reassess.  Anticipate discharge.   (Delayed documentation) patient has been sleeping comfortably.  Urinalysis notable for hematuria which I put in his discharge instructions and recommended follow-up but no evidence of acute infection.  COVID test is negative.  Patient is comfortable with the plan for discharge and outpatient follow-up.   Medications  lactated ringers bolus 1,000 mL (0 mLs Intravenous Stopped 01/31/23 0600)  ondansetron (ZOFRAN) injection 4 mg (4 mg Intravenous Given 01/31/23 0412)  iohexol (OMNIPAQUE) 350 MG/ML injection 80 mL (80 mLs Intravenous Contrast Given 01/31/23 0521)     ED Discharge Orders     None      Final diagnoses:  Hyperglycemia  Hemoglobinuria  Chronic kidney disease, unspecified CKD stage     Loleta Rose, MD 01/31/23 1217

## 2023-01-31 NOTE — Discharge Instructions (Addendum)
Your workup in the Emergency Department today was reassuring.  We did not find any specific abnormalities.  We recommend you drink plenty of fluids, take your regular medications and/or any new ones prescribed today, and follow up with the doctor(s) listed in these documents as recommended.  Return to the Emergency Department if you develop new or worsening symptoms that concern you.  

## 2023-01-31 NOTE — ED Provider Notes (Signed)
[facility] Provider Note    Event Date/Time   First MD Initiated Contact with Patient 01/31/23 [phone]     (approximate)   History   Hyperglycemia   HPI  Gabriel Shaw is a 52 y.o. male who presents to the ED for evaluation of Hyperglycemia   I review a [facility] vascular visit from April.  PAD, DM, right Charcot foot . Patient presents to the ED for evaluation of hyperglycemia, emesis and cough.  Reports feeling generalized weakness for much of the past 1 week.  Reports postprandial emesis without abdominal pain.  Reports frequent coughing when trying to take liquids and sometimes coughing outside of postprandial with some productive mucus.   Physical Exam   Triage Vital Signs: ED Triage Vitals  Encounter Vitals Group     BP      Systolic BP Percentile      Diastolic BP Percentile      Pulse      Resp      Temp      Temp src      SpO2      Weight      Height      Head Circumference      Peak Flow      Pain Score      Pain Loc      Pain Education      Exclude from Growth Chart     Most recent vital signs: Vitals:   01/31/23 0408 01/31/23 0500  BP: (!) 134/93 (!) 146/97  Pulse: 85 83  Resp:  17  Temp: 99.1 F (37.3 C)   SpO2: 96% 100%    General: Awake, no distress.  Occasional coughing, seems uncomfortable CV:  Good peripheral perfusion.  Resp:  Normal effort.  Abd:  No distention.  Lower abdominal tenderness without peritoneal features. MSK:  No deformity noted.  Neuro:  No focal deficits appreciated. Other:     ED Results / Procedures / Treatments   Labs (all labs ordered are listed, but only abnormal results are displayed) Labs Reviewed  BLOOD GAS, VENOUS - Abnormal; Notable for the following components:      Result Value   pO2, Ven <31 (*)    Bicarbonate 35.2 (*)    Acid-Base Excess 9.1 (*)    All other components within normal limits  CBC WITH DIFFERENTIAL/PLATELET - Abnormal; Notable for the following components:    Hemoglobin 12.5 (*)    HCT 37.1 (*)    Monocytes Absolute 1.2 (*)    All other components within normal limits  COMPREHENSIVE METABOLIC PANEL - Abnormal; Notable for the following components:   Sodium 134 (*)    Glucose, Bld 493 (*)    BUN 37 (*)    Creatinine, Ser 1.96 (*)    Calcium 8.3 (*)    Albumin 3.0 (*)    GFR, Estimated 41 (*)    All other components within normal limits  CBG MONITORING, ED - Abnormal; Notable for the following components:   Glucose-Capillary 437 (*)    All other components within normal limits  SARS CORONAVIRUS 2 BY RT PCR  LIPASE, BLOOD  BETA-HYDROXYBUTYRIC ACID  URINALYSIS, ROUTINE W REFLEX MICROSCOPIC    EKG Sinus rhythm with a rate of 86 bpm.  Normal axis and intervals.  QTc 490.  No STEMI.  RADIOLOGY CXR interpreted by me without evidence of acute cardiopulmonary pathology. CT abdomen/pelvis interpreted by me without evidence of acute intra-abdominal pathology  Official radiology  report(s): CT ABDOMEN PELVIS W CONTRAST  Result Date: 01/31/2023 CLINICAL DATA:  Constipation and nausea with weakness. EXAM: CT ABDOMEN AND PELVIS WITH CONTRAST TECHNIQUE: Multidetector CT imaging of the abdomen and pelvis was performed using the standard protocol following bolus administration of intravenous contrast. RADIATION DOSE REDUCTION: This exam was performed according to the departmental dose-optimization program which includes automated exposure control, adjustment of the mA and/or kV according to patient size and/or use of iterative reconstruction technique. CONTRAST:  80mL OMNIPAQUE IOHEXOL 350 MG/ML SOLN COMPARISON:  None Available. FINDINGS: Lower chest: Clear lower lungs. Extensive atheromatous calcification of the coronaries. Hepatobiliary: No focal liver abnormality.Full gallbladder. No evidence of stone or acute inflammation. Pancreas: Unremarkable. Spleen: Unremarkable. Adrenals/Urinary Tract: Negative adrenals. No hydronephrosis or ureteral stone. Punctate  calculus at the lower pole right kidney. Left renal hilar calcification appears vascular. Unremarkable bladder. Stomach/Bowel: No obstruction. No appendicitis. Mild distal colonic diverticulosis. Vascular/Lymphatic: No acute vascular abnormality. Extensive atheromatous calcification of the aorta. No mass or adenopathy. Reproductive:No pathologic findings. Other: No ascites or pneumoperitoneum. Musculoskeletal: No acute abnormalities. IMPRESSION: 1. No acute finding.  No bowel obstruction or visible inflammation. 2. Atherosclerosis, mild distal colonic diverticulosis, and punctate right renal calculus. Electronically Signed   By: Tiburcio Pea M.D.   On: 01/31/2023 05:38   DG Chest Portable 1 View  Result Date: 01/31/2023 CLINICAL DATA:  Productive cough EXAM: PORTABLE CHEST 1 VIEW COMPARISON:  01/13/2021 FINDINGS: Low volume chest. There is no edema, consolidation, effusion, or pneumothorax. Normal heart size and mediastinal contours. Artifact from EKG leads. IMPRESSION: Negative low volume chest. Electronically Signed   By: Tiburcio Pea M.D.   On: 01/31/2023 04:26    PROCEDURES and INTERVENTIONS:  .1-3 Lead EKG Interpretation  Performed by: Delton Prairie, MD Authorized by: Delton Prairie, MD     Interpretation: normal     ECG rate:  80   ECG rate assessment: normal     Rhythm: sinus rhythm     Ectopy: none     Conduction: normal     Medications  lactated ringers bolus 1,000 mL (1,000 mLs Intravenous New Bag/Given 01/31/23 0416)  ondansetron (ZOFRAN) injection 4 mg (4 mg Intravenous Given 01/31/23 0412)  iohexol (OMNIPAQUE) 350 MG/ML injection 80 mL (80 mLs Intravenous Contrast Given 01/31/23 0521)     IMPRESSION / MDM / ASSESSMENT AND PLAN / ED COURSE  I reviewed the triage vital signs and the nursing notes.  Differential diagnosis includes, but is not limited to, DKA, HHS, viral syndrome, dehydration, pancreatitis, acute cystitis  {Patient presents with symptoms of an acute illness or  injury that is potentially life-threatening.  Patient presents to the ED for evaluation of about 5 days of poor intake, nonproductive cough and emesis.  Hyperglycemia without DKA is noted.  Renal function is a little bit worse than baseline from his CKD but not quite AKI criteria.  Normal lipase and venous pH on the blood gas.  Essentially normal CBC.  CXR is clear and CT abdomen/pelvis without acute pathology.  Awaiting urinalysis and COVID test at this point.  Viral syndrome is a possibility.  Signed out to oncoming provider, may be suitable for outpatient management  Clinical Course as of 01/31/23 0656  [facility] Jan 31, 2023  1027 Reassessed.  Patient reports feeling a little bit better.  We discussed generally reassuring workup overall.  No signs of DKA, intra-abdominal pathology.  We discussed hyperglycemia.  We discussed CKD that is slightly worse than baseline.  I provide him with a  cup of water at his request.  We discussed plan of care pending COVID swab and UA. [DS]    Clinical Course User Index [DS] Delton Prairie, MD     FINAL CLINICAL IMPRESSION(S) / ED DIAGNOSES   Final diagnoses:  Hyperglycemia     Rx / DC Orders   ED Discharge Orders     None        Note:  This document was prepared using Dragon voice recognition software and may include unintentional dictation errors.   Delton Prairie, MD 01/31/23 [phone]

## 2023-02-15 DIAGNOSIS — S92351A Displaced fracture of fifth metatarsal bone, right foot, initial encounter for closed fracture: Secondary | ICD-10-CM | POA: Diagnosis not present

## 2023-02-15 DIAGNOSIS — L97519 Non-pressure chronic ulcer of other part of right foot with unspecified severity: Secondary | ICD-10-CM | POA: Diagnosis not present

## 2023-02-28 DIAGNOSIS — Z419 Encounter for procedure for purposes other than remedying health state, unspecified: Secondary | ICD-10-CM | POA: Diagnosis not present

## 2023-03-03 DIAGNOSIS — E113531 Type 2 diabetes mellitus with proliferative diabetic retinopathy with traction retinal detachment not involving the macula, right eye: Secondary | ICD-10-CM | POA: Diagnosis not present

## 2023-03-03 DIAGNOSIS — Z7982 Long term (current) use of aspirin: Secondary | ICD-10-CM | POA: Diagnosis not present

## 2023-03-03 DIAGNOSIS — H4311 Vitreous hemorrhage, right eye: Secondary | ICD-10-CM | POA: Diagnosis not present

## 2023-03-03 DIAGNOSIS — Z79899 Other long term (current) drug therapy: Secondary | ICD-10-CM | POA: Diagnosis not present

## 2023-03-03 DIAGNOSIS — Z7985 Long-term (current) use of injectable non-insulin antidiabetic drugs: Secondary | ICD-10-CM | POA: Diagnosis not present

## 2023-03-03 DIAGNOSIS — N189 Chronic kidney disease, unspecified: Secondary | ICD-10-CM | POA: Diagnosis not present

## 2023-03-03 DIAGNOSIS — E785 Hyperlipidemia, unspecified: Secondary | ICD-10-CM | POA: Diagnosis not present

## 2023-03-03 DIAGNOSIS — E113539 Type 2 diabetes mellitus with proliferative diabetic retinopathy with traction retinal detachment not involving the macula, unspecified eye: Secondary | ICD-10-CM | POA: Diagnosis not present

## 2023-03-03 DIAGNOSIS — F17220 Nicotine dependence, chewing tobacco, uncomplicated: Secondary | ICD-10-CM | POA: Diagnosis not present

## 2023-03-03 DIAGNOSIS — Z8711 Personal history of peptic ulcer disease: Secondary | ICD-10-CM | POA: Diagnosis not present

## 2023-03-03 DIAGNOSIS — E1140 Type 2 diabetes mellitus with diabetic neuropathy, unspecified: Secondary | ICD-10-CM | POA: Diagnosis not present

## 2023-03-03 DIAGNOSIS — Z794 Long term (current) use of insulin: Secondary | ICD-10-CM | POA: Diagnosis not present

## 2023-03-03 DIAGNOSIS — Z7984 Long term (current) use of oral hypoglycemic drugs: Secondary | ICD-10-CM | POA: Diagnosis not present

## 2023-03-03 DIAGNOSIS — K219 Gastro-esophageal reflux disease without esophagitis: Secondary | ICD-10-CM | POA: Diagnosis not present

## 2023-03-03 DIAGNOSIS — I129 Hypertensive chronic kidney disease with stage 1 through stage 4 chronic kidney disease, or unspecified chronic kidney disease: Secondary | ICD-10-CM | POA: Diagnosis not present

## 2023-03-03 DIAGNOSIS — H3341 Traction detachment of retina, right eye: Secondary | ICD-10-CM | POA: Diagnosis not present

## 2023-03-03 DIAGNOSIS — E1122 Type 2 diabetes mellitus with diabetic chronic kidney disease: Secondary | ICD-10-CM | POA: Diagnosis not present

## 2023-03-10 DIAGNOSIS — S80822A Blister (nonthermal), left lower leg, initial encounter: Secondary | ICD-10-CM | POA: Diagnosis not present

## 2023-03-10 DIAGNOSIS — L97413 Non-pressure chronic ulcer of right heel and midfoot with necrosis of muscle: Secondary | ICD-10-CM | POA: Diagnosis not present

## 2023-03-10 DIAGNOSIS — L97512 Non-pressure chronic ulcer of other part of right foot with fat layer exposed: Secondary | ICD-10-CM | POA: Diagnosis not present

## 2023-03-10 DIAGNOSIS — Z7689 Persons encountering health services in other specified circumstances: Secondary | ICD-10-CM | POA: Diagnosis not present

## 2023-03-10 DIAGNOSIS — E11621 Type 2 diabetes mellitus with foot ulcer: Secondary | ICD-10-CM | POA: Diagnosis not present

## 2023-03-10 DIAGNOSIS — E11610 Type 2 diabetes mellitus with diabetic neuropathic arthropathy: Secondary | ICD-10-CM | POA: Diagnosis not present

## 2023-03-12 ENCOUNTER — Encounter: Payer: Self-pay | Admitting: Pediatrics

## 2023-03-12 ENCOUNTER — Ambulatory Visit: Payer: Medicaid Other | Admitting: Pediatrics

## 2023-03-12 ENCOUNTER — Telehealth: Payer: Self-pay

## 2023-03-12 VITALS — BP 118/73 | HR 86 | Temp 99.1°F | Ht 73.7 in | Wt 242.0 lb

## 2023-03-12 DIAGNOSIS — R5383 Other fatigue: Secondary | ICD-10-CM | POA: Diagnosis not present

## 2023-03-12 DIAGNOSIS — F331 Major depressive disorder, recurrent, moderate: Secondary | ICD-10-CM | POA: Diagnosis not present

## 2023-03-12 DIAGNOSIS — R1310 Dysphagia, unspecified: Secondary | ICD-10-CM

## 2023-03-12 DIAGNOSIS — E1151 Type 2 diabetes mellitus with diabetic peripheral angiopathy without gangrene: Secondary | ICD-10-CM | POA: Diagnosis not present

## 2023-03-12 DIAGNOSIS — E113539 Type 2 diabetes mellitus with proliferative diabetic retinopathy with traction retinal detachment not involving the macula, unspecified eye: Secondary | ICD-10-CM | POA: Diagnosis not present

## 2023-03-12 DIAGNOSIS — E1142 Type 2 diabetes mellitus with diabetic polyneuropathy: Secondary | ICD-10-CM

## 2023-03-12 DIAGNOSIS — I739 Peripheral vascular disease, unspecified: Secondary | ICD-10-CM

## 2023-03-12 DIAGNOSIS — Z7985 Long-term (current) use of injectable non-insulin antidiabetic drugs: Secondary | ICD-10-CM | POA: Diagnosis not present

## 2023-03-12 DIAGNOSIS — E785 Hyperlipidemia, unspecified: Secondary | ICD-10-CM | POA: Insufficient documentation

## 2023-03-12 DIAGNOSIS — Z7689 Persons encountering health services in other specified circumstances: Secondary | ICD-10-CM

## 2023-03-12 LAB — MICROALBUMIN / CREATININE URINE RATIO
Creatinine, Urine: 300 mg/dL
Microalbumin, Urine: 150 ug/mL

## 2023-03-12 MED ORDER — SEMAGLUTIDE(0.25 OR 0.5MG/DOS) 2 MG/1.5ML ~~LOC~~ SOPN
0.2500 mg | PEN_INJECTOR | SUBCUTANEOUS | 0 refills | Status: AC
Start: 2023-03-12 — End: 2023-04-11

## 2023-03-12 MED ORDER — SEMAGLUTIDE(0.25 OR 0.5MG/DOS) 2 MG/1.5ML ~~LOC~~ SOPN
0.5000 mg | PEN_INJECTOR | SUBCUTANEOUS | 0 refills | Status: AC
Start: 2023-03-12 — End: 2023-04-11

## 2023-03-12 NOTE — Progress Notes (Signed)
Establish Care  BP 118/73   Pulse 86   Temp 99.1 F (37.3 C) (Oral)   Ht 6' 1.7" (1.872 m)   Wt 242 lb (109.8 kg)   SpO2 97%   BMI 31.32 kg/m    Subjective:    Patient ID: Gabriel Shaw, male    DOB: [birth year], 52 y.o.   MRN: [id number]  HPI: Gabriel Shaw is a 52 y.o. male  Chief Complaint  Patient presents with   Depression   Diabetes   Hyperlipidemia   #DM Poorly controlled Reports he was taken off trulicity around March and glucoses have been bad since Not sure why this was stopped Reports good compliance with insulin regimen Unable to find last A1c but glucose >400 in last BMP Reviewed glucometer readings today range from 120-290s He reports increased fatigue, difficulty swallowing intermittently for the last few months. Notes that someone told him esophageal sphincter didn't close in the past. Unsure if has had barium swallow. He has not been evaluated by GI. Denies any acid reflux.  Some nausea, no vomiting. 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 Follows with podiatry, currently undergoing treatment for R foot ulcer  #Depression He notes that he is stable, passive thoughts of death without any plan. He notes this is chronic for him. "I'm not gonna harm myself". See PHQ9 and GAD7 below   Current Outpatient Medications (Endocrine & Metabolic):    insulin aspart (NOVOLOG) 100 unit/mL injection, Inject 15 Units into the skin 3 (three) times daily before meals.   LANTUS SOLOSTAR 100 UNIT/ML Solostar Pen, Inject into the skin daily. Inject 50 units in the AM and 70 units in the PM   metFORMIN (GLUCOPHAGE-XR) 500 MG 24 hr tablet, Take 500 mg by mouth 2 (two) times daily with a meal.   Semaglutide,0.25 or 0.5MG /DOS, 2 MG/1.5ML SOPN, Inject 0.25 mg into the skin once a week.   Semaglutide,0.25 or 0.5MG /DOS, 2 MG/1.5ML SOPN, Inject 0.5 mg  into the skin once a week.  Current Outpatient Medications (Cardiovascular):    atorvastatin (LIPITOR) 20 MG tablet, Take 1 tablet by mouth daily.   Current Outpatient Medications (Analgesics):    aspirin 81 MG chewable tablet, Chew 81 mg by mouth daily.   Current Outpatient Medications (Other):    neomycin-polymyxin b-dexamethasone (MAXITROL) 3.5-10000-0.1 OINT, Place 1 Application into the right eye 4 (four) times daily.   ofloxacin (OCUFLOX) 0.3 % ophthalmic solution, Place 1 drop into the right eye 4 (four) times daily.   prednisoLONE acetate (PRED FORTE) 1 % ophthalmic suspension, Place 1 drop into the right eye 4 (four) times daily.   TRUEPLUS 5-BEVEL PEN NEEDLES 32G X 4 MM MISC,    Relevant past medical, surgical, family and social history reviewed and updated as indicated. Interim medical history since our last visit reviewed. Allergies and medications reviewed and updated.  ROS per HPI unless specifically indicated above     Objective:    BP 118/73   Pulse 86   Temp 99.1 F (37.3 C) (Oral)   Ht 6' 1.7" (1.872 m)   Wt 242 lb (109.8 kg)   SpO2 97%   BMI 31.32 kg/m   Wt Readings from Last 3 Encounters:  03/12/23 242 lb (109.8 kg)  01/31/23 230 lb (104.3 kg)  01/13/21 210 lb (95.3 kg)    Physical Exam: GEN: alert, cooperative, and in NAD HENT: atraumatic,  normocephalic EYES: anicteric sclera  CV: hemodynamically stable, RRR, no murmurs, rubs, gallops  RESP: breathing comfortably on room air, no wheezing, crackles, rales GI/ABD: soft, non-tender, non-distended  EXT: warm and well perfused, no lower extremity edema PSYCH: normal behavior and appropriate affect     03/12/2023    8:52 AM  Depression screen PHQ 2/9  Decreased Interest 3  Down, Depressed, Hopeless 3  PHQ - 2 Score 6  Altered sleeping 3  Tired, decreased energy 3  Change in appetite 3  Feeling bad or failure about yourself  3  Trouble concentrating 3  Moving slowly or fidgety/restless 3   Suicidal thoughts 3  PHQ-9 Score 27  Difficult doing work/chores Very difficult      03/12/2023    8:57 AM  GAD 7 : Generalized Anxiety Score  Nervous, Anxious, on Edge 3  Control/stop worrying 2  Worry too much - different things 0  Trouble relaxing 3  Restless 0  Easily annoyed or irritable 3  Afraid - awful might happen 0  Total GAD 7 Score 11  Anxiety Difficulty Very difficult    Assessment & Plan:  Assessment & Plan   Gabriel "Sam" was seen today for depression, diabetes and hyperlipidemia.  Diagnoses and all orders for this visit:  Type 2 diabetes mellitus with polyneuropathy (HCC) Other fatigue Dysphagia, other Patient with poorly controlled DM. Previously well managed with trulicity now managing with insulin and metformin. He is a great candidate for a GLP-1, plan on switching to ozempic. I suspect his high doses of insulin have maxed out their benefit. I additionally suspect that his difficulty swallowing and fatigue may be related to glycemic control but will continue to assess at follow up. Consider barrium swallow and/or GI referral. -     Basic Metabolic Panel (BMET) -     HgB A1c -     Lipid Profile -     Vitamin B12 -     Folate -     CBC with Differential/Platelet -     Iron -     Ferritin -     Iron and TIBC -     Urine Microalbumin w/creat. ratio -     Semaglutide,0.25 or 0.5MG /DOS, 2 MG/1.5ML SOPN; Inject 0.25 mg into the skin once a week. -     Semaglutide,0.25 or 0.5MG /DOS, 2 MG/1.5ML SOPN; Inject 0.5 mg into the skin once a week.  Major depressive disorder, recurrent episode, moderate degree (HCC) Chronic. Very high PHQ9 and GAD7, denies any safety concerns. Declines treatment today. Will check thyroid function. -     TSH  Establish care Reviewed patient record including history, medications, problem list. Will schedule for physical and follow up as below.  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 Next appointment scheduled in October.  PAD (peripheral artery disease) (HCC) Recent ulcer, following closely with podiatry. Continue DM and lipid management for risk reduction. On statin and ASA.  Follow up plan: Return in about 2 months (around 05/12/2023) for diabetes.  Joscelyne Renville Howell Pringle, MD

## 2023-03-12 NOTE — Progress Notes (Signed)
03/12/2023  Patient ID: Gabriel Shaw, male   DOB: 04-May-1971, 52 y.o.   MRN: [id number]  Clinic routed request from PCP to check on affordability of recently prescribed Ozempic.  Contacted pharmacy, and this medication is requiring a PA on the patient's insurance.  This has been submitted to the plan through CoverMyMeds.  I will monitor progress to keep patient and provider informed.  Lenna Gilford, PharmD, DPLA

## 2023-03-12 NOTE — Patient Instructions (Addendum)
The schedule for Ozempic is: 0.25mg  for 4 weeks 0.5mg  for 4 weeks RETURN VISIT IN CLINIC TO CHECK IN 1mg  for 4 weeks 2mg  goal dose  The most common adverse reactions, reported in >=5% of patients treated with Ozempic are nausea, vomiting, diarrhea, abdominal pain, and constipation. These typically resolve over time but please let me know if they do not resolve.  How to use pen Text PEN to [phone] to get a link to the video to learn how to use the Ozempic pen.  Get savings information Eligible patients pay as little as $25a for a 15-month, 34-month, or 4-month prescription by texting BEGIN to [phone] or registering on [email or web address]  For instructional videos and more information about ozempic go here: [email or web address]

## 2023-03-13 LAB — BASIC METABOLIC PANEL
BUN/Creatinine Ratio: 9 (ref 9–20)
BUN: 11 mg/dL (ref 6–24)
CO2: 27 mmol/L (ref 20–29)
Calcium: 9.3 mg/dL (ref 8.7–10.2)
Chloride: 105 mmol/L (ref 96–106)
Creatinine, Ser: 1.19 mg/dL (ref 0.76–1.27)
Glucose: 54 mg/dL — ABNORMAL LOW (ref 70–99)
Potassium: 3.9 mmol/L (ref 3.5–5.2)
Sodium: 145 mmol/L — ABNORMAL HIGH (ref 134–144)
eGFR: 74 mL/min/{1.73_m2} (ref >59)

## 2023-03-13 LAB — CBC WITH DIFFERENTIAL/PLATELET
Basophils Absolute: 0 10*3/uL (ref 0.0–0.2)
Basos: 0 %
EOS (ABSOLUTE): 0.3 10*3/uL (ref 0.0–0.4)
Eos: 3 %
Hematocrit: 35.2 % — ABNORMAL LOW (ref 37.5–51.0)
Hemoglobin: 11.7 g/dL — ABNORMAL LOW (ref 13.0–17.7)
Immature Grans (Abs): 0 10*3/uL (ref 0.0–0.1)
Immature Granulocytes: 0 %
Lymphocytes Absolute: 3.3 10*3/uL — ABNORMAL HIGH (ref 0.7–3.1)
Lymphs: 37 %
MCH: 27.9 pg (ref 26.6–33.0)
MCHC: 33.2 g/dL (ref 31.5–35.7)
MCV: 84 fL (ref 79–97)
Monocytes Absolute: 0.7 10*3/uL (ref 0.1–0.9)
Monocytes: 7 %
Neutrophils Absolute: 4.7 10*3/uL (ref 1.4–7.0)
Neutrophils: 53 %
Platelets: 363 10*3/uL (ref 150–450)
RBC: 4.2 x10E6/uL (ref 4.14–5.80)
RDW: 14.2 % (ref 11.6–15.4)
WBC: 9 10*3/uL (ref 3.4–10.8)

## 2023-03-13 LAB — LIPID PANEL
Chol/HDL Ratio: 3.8 ratio (ref 0.0–5.0)
Cholesterol, Total: 233 mg/dL — ABNORMAL HIGH (ref 100–199)
HDL: 61 mg/dL (ref >39)
LDL Chol Calc (NIH): 148 mg/dL — ABNORMAL HIGH (ref 0–99)
Triglycerides: 137 mg/dL (ref 0–149)
VLDL Cholesterol Cal: 24 mg/dL (ref 5–40)

## 2023-03-13 LAB — IRON AND TIBC
Iron Saturation: 22 % (ref 15–55)
Iron: 62 ug/dL (ref 38–169)
Total Iron Binding Capacity: 277 ug/dL (ref 250–450)
UIBC: 215 ug/dL (ref 111–343)

## 2023-03-13 LAB — HEMOGLOBIN A1C
Est. average glucose Bld gHb Est-mCnc: 306 mg/dL
Hgb A1c MFr Bld: 12.3 % — ABNORMAL HIGH (ref 4.8–5.6)

## 2023-03-13 LAB — VITAMIN B12: Vitamin B-12: 554 pg/mL (ref 232–1245)

## 2023-03-13 LAB — TSH: TSH: 3.01 u[IU]/mL (ref 0.450–4.500)

## 2023-03-13 LAB — FERRITIN: Ferritin: 257 ng/mL (ref 30–400)

## 2023-03-13 LAB — FOLATE: Folate: 9.8 ng/mL (ref >3.0)

## 2023-03-15 ENCOUNTER — Telehealth: Payer: Self-pay

## 2023-03-15 NOTE — Progress Notes (Signed)
03/15/2023  Patient ID: Gabriel Shaw, male   DOB: May 09, 1971, 52 y.o.   MRN: [id number]  Prior authorization for Ozempic has been approved per CoverMyMeds.  Contacted patient's pharmacy to have them process prescription, and medication is now going through for $4.  Contacted patient to make him aware, educate on use, and schedule telephone follow-up in 4 weeks to see how medication is working for him.  Lenna Gilford, PharmD, DPLA

## 2023-03-15 NOTE — Telephone Encounter (Signed)
Patient has called back, requesting to speak to Sabino Niemann, Pharmacist, about his medication Ozempic. Please advise and contact patient back @ # [phone].

## 2023-03-16 NOTE — Progress Notes (Signed)
03/16/2023  Patient ID: Gabriel Shaw, male   DOB: [birth year], 52 y.o.   MRN: [id number]  Patient outreach to return patient's call from yesterday.  He was able to pick up Ozempic but was unclear on administration and how many doses each pen would have.  Counseled patient that pens are multi-use, and this first will last approximately 6 weeks.  Discussed turning pen until 0.25mg  is seen in dosing window and injecting the same was he does his insulin.  He will do this for 4 weeks, then we have a follow-up visit 10/14 to discuss increasing to 0.5mg  weekly.  Provided my direct phone number in case other questions/needs come up before scheduled follow-up visit.  Also offered to see patient at [facility] if needed for administration consult, but he declined at this time.  Lenna Gilford, PharmD, DPLA

## 2023-03-16 NOTE — Telephone Encounter (Signed)
Your are welcome

## 2023-03-18 ENCOUNTER — Other Ambulatory Visit: Payer: Self-pay | Admitting: Pediatrics

## 2023-03-18 DIAGNOSIS — E785 Hyperlipidemia, unspecified: Secondary | ICD-10-CM

## 2023-03-18 MED ORDER — ATORVASTATIN CALCIUM 40 MG PO TABS
40.0000 mg | ORAL_TABLET | Freq: Every day | ORAL | 3 refills | Status: DC
Start: 2023-03-18 — End: 2023-07-05

## 2023-03-19 DIAGNOSIS — E10621 Type 1 diabetes mellitus with foot ulcer: Secondary | ICD-10-CM | POA: Diagnosis not present

## 2023-03-19 DIAGNOSIS — S92351A Displaced fracture of fifth metatarsal bone, right foot, initial encounter for closed fracture: Secondary | ICD-10-CM | POA: Diagnosis not present

## 2023-03-19 DIAGNOSIS — Z7689 Persons encountering health services in other specified circumstances: Secondary | ICD-10-CM | POA: Diagnosis not present

## 2023-03-19 DIAGNOSIS — E10622 Type 1 diabetes mellitus with other skin ulcer: Secondary | ICD-10-CM | POA: Diagnosis not present

## 2023-03-25 DIAGNOSIS — Z7689 Persons encountering health services in other specified circumstances: Secondary | ICD-10-CM | POA: Diagnosis not present

## 2023-03-25 DIAGNOSIS — H4311 Vitreous hemorrhage, right eye: Secondary | ICD-10-CM | POA: Diagnosis not present

## 2023-03-30 DIAGNOSIS — Z419 Encounter for procedure for purposes other than remedying health state, unspecified: Secondary | ICD-10-CM | POA: Diagnosis not present

## 2023-04-02 ENCOUNTER — Ambulatory Visit: Payer: Medicaid Other | Admitting: Pediatrics

## 2023-04-02 ENCOUNTER — Encounter: Payer: Self-pay | Admitting: Pediatrics

## 2023-04-02 ENCOUNTER — Other Ambulatory Visit: Payer: Self-pay | Admitting: Pediatrics

## 2023-04-02 VITALS — BP 118/84 | HR 90 | Temp 98.4°F

## 2023-04-02 DIAGNOSIS — Z7985 Long-term (current) use of injectable non-insulin antidiabetic drugs: Secondary | ICD-10-CM | POA: Diagnosis not present

## 2023-04-02 DIAGNOSIS — E1142 Type 2 diabetes mellitus with diabetic polyneuropathy: Secondary | ICD-10-CM

## 2023-04-02 LAB — URINALYSIS, ROUTINE W REFLEX MICROSCOPIC
Bilirubin, UA: NEGATIVE
Glucose, UA: NEGATIVE
Leukocytes,UA: NEGATIVE
Nitrite, UA: NEGATIVE
Specific Gravity, UA: >1.03 — ABNORMAL HIGH (ref 1.005–1.030)
Urobilinogen, Ur: 0.2 mg/dL (ref 0.2–1.0)
pH, UA: 5.5 (ref 5.0–7.5)

## 2023-04-02 LAB — MICROSCOPIC EXAMINATION
Bacteria, UA: NONE SEEN
WBC, UA: NONE SEEN /[HPF] (ref 0–5)

## 2023-04-02 LAB — MICROALBUMIN, URINE WAIVED
Creatinine, Urine Waived: 300 mg/dL (ref 10–300)
Microalb, Ur Waived: 150 mg/L — ABNORMAL HIGH (ref 0–19)

## 2023-04-02 MED ORDER — METFORMIN HCL ER (MOD) 1000 MG PO TB24
1000.0000 mg | ORAL_TABLET | Freq: Two times a day (BID) | ORAL | 3 refills | Status: DC
Start: 2023-04-02 — End: 2023-04-02

## 2023-04-02 MED ORDER — METFORMIN HCL 500 MG PO TABS: 1000.0000 mg | ORAL_TABLET | Freq: Two times a day (BID) | ORAL | 3 refills | Status: DC

## 2023-04-02 NOTE — Progress Notes (Signed)
**Note Gabriel-Identified via Obfuscation** Office Visit  BP 118/84   Pulse 90   Temp 98.4 F (36.9 C) (Oral)    Subjective:    Patient ID: Gabriel Shaw, male    DOB: April 27, 1971, 52 y.o.   MRN: [id number]  HPI: Gabriel Shaw is a 52 y.o. male  Chief Complaint  Patient presents with   Diabetes   DM Regimen: metformin 500mg  BID, Ozempic 0.25mg , lantus 50u am and 70units pm Readings at home have been very variable. Sometimes lower than 70 sometimes in the high 200s Stopped meal time insulin, was confused about regimen Pharmacy fu scheduled 10/14 to increase to 0.5mg  Follows w podiatry for ulcer, on abx He went to the eye doctor - f/u scheduled for next month; h/o retinal detachment Denies increased urinary frequency, urgency, neuropathy, increased thirst.  Relevant past medical, surgical, family and social history reviewed and updated as indicated. Interim medical history since our last visit reviewed. Allergies and medications reviewed and updated.  ROS per HPI unless specifically indicated above     Objective:    BP 118/84   Pulse 90   Temp 98.4 F (36.9 C) (Oral)   Wt Readings from Last 3 Encounters:  03/12/23 242 lb (109.8 kg)  01/31/23 230 lb (104.3 kg)  01/13/21 210 lb (95.3 kg)     Physical Exam Constitutional:      Appearance: Normal appearance.  Pulmonary:     Effort: Pulmonary effort is normal.  Musculoskeletal:        General: Normal range of motion.  Skin:    Comments: Normal skin color  Neurological:     General: No focal deficit present.     Mental Status: He is alert. Mental status is at baseline.  Psychiatric:        Mood and Affect: Mood normal.        Behavior: Behavior normal.        Thought Content: Thought content normal.        Assessment & Plan:  Assessment & Plan   Type 2 diabetes mellitus with polyneuropathy (HCC) Assessment & Plan: Pt with A1c>12. Very concerned about regimen he is reporting and variability in his glucoses. Had discussion today about different  insulin types. Given low sugars, plan to consolidate regimen and have more standardize glucose monitoring., with only once daily lantus and restart meal time insulin. Continue ozempic, scheduled to increase in 2 weeks with pharmacy. Plan to have him on daily lantus 50units with 15units meal time. I will look into getting him set up w dexcom as I am worried he is having very low values intermittently at home. Increase metformin to 1000mg  BID. Continue statin. Repeat labs below. Consider sliding scale discussion at follow up visit. Follows w podiatry and optho.  Orders: -     Basic metabolic panel -     Urinalysis, Routine w reflex microscopic -     metFORMIN HCl ER (MOD); Take 1 tablet (1,000 mg total) by mouth 2 (two) times daily with a meal.  Dispense: 180 tablet; Refill: 3 -     Microalbumin, Urine Waived  Other orders -     Microscopic Examination     Follow up plan: Return in 6 weeks (on 05/14/2023) for DM.  Javid Kemler Howell Pringle, MD

## 2023-04-02 NOTE — Assessment & Plan Note (Signed)
Pt with A1c>12. Very concerned about regimen he is reporting and variability in his glucoses. Had discussion today about different insulin types. Given low sugars, plan to consolidate regimen and have more standardize glucose monitoring., with only once daily lantus and restart meal time insulin. Continue ozempic, scheduled to increase in 2 weeks with pharmacy. Plan to have him on daily lantus 50units with 15units meal time. I will look into getting him set up w dexcom as I am worried he is having very low values intermittently at home. Increase metformin to 1000mg  BID. Continue statin. Repeat labs below. Consider sliding scale discussion at follow up visit. Follows w podiatry and optho.

## 2023-04-02 NOTE — Patient Instructions (Addendum)
Plan: - Please do lantus 50u daily - 15units of novolog with meals - INCREASE metformin 1000mg  twice a day - a new prescription was sent  I'll message you about dexcom

## 2023-04-02 NOTE — Telephone Encounter (Signed)
Requested medication (s) are due for refill today - no  Requested medication (s) are on the active medication list -yes  Future visit scheduled -yes  Last refill: 04/02/23  Notes to clinic:   Pharmacy comment: plase resend for regular extended realease 500 mg. the glumetza is not covered and very expensive thanks!    Requested Prescriptions  Pending Prescriptions Disp Refills   metformin (FORTAMET) 1000 MG (OSM) 24 hr tablet [Pharmacy Med Name: METFORMIN OSM ER 1000MG   TAB] 180 tablet 3    Sig: TAKE 1 TABLET BY MOUTH TWICE DAILY WITH A MEAL     Endocrinology:  Diabetes - Biguanides Failed - 04/02/2023 11:51 AM      Failed - HBA1C is between 0 and 7.9 and within 180 days    Hemoglobin A1C  Date Value Ref Range Status  12/23/2013 10.4 (H) 4.2 - 6.3 % Final    Comment:    The American Diabetes Association recommends that a primary goal of therapy should be <7% and that physicians should reevaluate the treatment regimen in patients with HbA1c values consistently >8%.    Hgb A1c MFr Bld  Date Value Ref Range Status  03/12/2023 12.3 (H) 4.8 - 5.6 % Final    Comment:             Prediabetes: 5.7 - 6.4          Diabetes: >6.4          Glycemic control for adults with diabetes: <7.0          Passed - Cr in normal range and within 360 days    Creatinine  Date Value Ref Range Status  12/23/2013 1.36 (H) 0.60 - 1.30 mg/dL Final   Creatinine, Ser  Date Value Ref Range Status  03/12/2023 1.19 0.76 - 1.27 mg/dL Final         Passed - eGFR in normal range and within 360 days    EGFR (African American)  Date Value Ref Range Status  12/23/2013 >60  Final   GFR calc Af Amer  Date Value Ref Range Status  07/13/2017 >60 >60 mL/min Final    Comment:    (NOTE) The eGFR has been calculated using the CKD EPI equation. This calculation has not been validated in all clinical situations. eGFR's persistently <60 mL/min signify possible Chronic Kidney Disease.    EGFR (Non-African  Amer.)  Date Value Ref Range Status  12/23/2013 >60  Final    Comment:    eGFR values <79mL/min/1.73 m2 may be an indication of chronic kidney disease (CKD). Calculated eGFR is useful in patients with stable renal function. The eGFR calculation will not be reliable in acutely ill patients when serum creatinine is changing rapidly. It is not useful in  patients on dialysis. The eGFR calculation may not be applicable to patients at the low and high extremes of body sizes, pregnant women, and vegetarians.    GFR, Estimated  Date Value Ref Range Status  01/31/2023 41 (L) >60 mL/min Final    Comment:    (NOTE) Calculated using the CKD-EPI Creatinine Equation (2021)    eGFR  Date Value Ref Range Status  03/12/2023 74 >59 mL/min/1.73 Final         Passed - B12 Level in normal range and within 720 days    Vitamin B-12  Date Value Ref Range Status  03/12/2023 554 232 - 1,245 pg/mL Final         Passed - Valid encounter within last  6 months    Recent Outpatient Visits           Today Type 2 diabetes mellitus with polyneuropathy [facility])   [facility] Jackolyn Confer, MD   3 weeks ago Type 2 diabetes mellitus with polyneuropathy [facility])   [facility] Jackolyn Confer, MD       Future Appointments             In 1 month Evelene Croon, Atilano Median, MD  [facility]            Passed - CBC within normal limits and completed in the last 12 months    WBC  Date Value Ref Range Status  03/12/2023 9.0 3.4 - 10.8 x10E3/uL Final  01/31/2023 9.8 4.0 - 10.5 K/uL Final   RBC  Date Value Ref Range Status  03/12/2023 4.20 4.14 - 5.80 x10E6/uL Final  01/31/2023 4.48 4.22 - 5.81 MIL/uL Final   Hemoglobin  Date Value Ref Range Status  03/12/2023 11.7 (L) 13.0 - 17.7 g/dL Final   Hematocrit  Date Value Ref Range Status  03/12/2023 35.2 (L) 37.5 - 51.0 % Final   MCHC  Date Value Ref Range Status  03/12/2023 33.2  31.5 - 35.7 g/dL Final  40/98/1191 47.8 30.0 - 36.0 g/dL Final   [facility]  Date Value Ref Range Status  03/12/2023 27.9 26.6 - 33.0 pg Final  01/31/2023 27.9 26.0 - 34.0 pg Final   MCV  Date Value Ref Range Status  03/12/2023 84 79 - 97 fL Final  12/23/2013 85 80 - 100 fL Final   No results found for: "PLTCOUNTKUC", "LABPLAT", "POCPLA" RDW  Date Value Ref Range Status  03/12/2023 14.2 11.6 - 15.4 % Final  12/23/2013 13.6 11.5 - 14.5 % Final            Requested Prescriptions  Pending Prescriptions Disp Refills   metformin (FORTAMET) 1000 MG (OSM) 24 hr tablet [Pharmacy Med Name: METFORMIN OSM ER 1000MG   TAB] 180 tablet 3    Sig: TAKE 1 TABLET BY MOUTH TWICE DAILY WITH A MEAL     Endocrinology:  Diabetes - Biguanides Failed - 04/02/2023 11:51 AM      Failed - HBA1C is between 0 and 7.9 and within 180 days    Hemoglobin A1C  Date Value Ref Range Status  12/23/2013 10.4 (H) 4.2 - 6.3 % Final    Comment:    The American Diabetes Association recommends that a primary goal of therapy should be <7% and that physicians should reevaluate the treatment regimen in patients with HbA1c values consistently >8%.    Hgb A1c MFr Bld  Date Value Ref Range Status  03/12/2023 12.3 (H) 4.8 - 5.6 % Final    Comment:             Prediabetes: 5.7 - 6.4          Diabetes: >6.4          Glycemic control for adults with diabetes: <7.0          Passed - Cr in normal range and within 360 days    Creatinine  Date Value Ref Range Status  12/23/2013 1.36 (H) 0.60 - 1.30 mg/dL Final   Creatinine, Ser  Date Value Ref Range Status  03/12/2023 1.19 0.76 - 1.27 mg/dL Final         Passed - eGFR in normal range and within 360 days    EGFR (  African American)  Date Value Ref Range Status  12/23/2013 >60  Final   GFR calc Af Amer  Date Value Ref Range Status  07/13/2017 >60 >60 mL/min Final    Comment:    (NOTE) The eGFR has been calculated using the CKD EPI equation. This calculation has  not been validated in all clinical situations. eGFR's persistently <60 mL/min signify possible Chronic Kidney Disease.    EGFR (Non-African Amer.)  Date Value Ref Range Status  12/23/2013 >60  Final    Comment:    eGFR values <59mL/min/1.73 m2 may be an indication of chronic kidney disease (CKD). Calculated eGFR is useful in patients with stable renal function. The eGFR calculation will not be reliable in acutely ill patients when serum creatinine is changing rapidly. It is not useful in  patients on dialysis. The eGFR calculation may not be applicable to patients at the low and high extremes of body sizes, pregnant women, and vegetarians.    GFR, Estimated  Date Value Ref Range Status  01/31/2023 41 (L) >60 mL/min Final    Comment:    (NOTE) Calculated using the CKD-EPI Creatinine Equation (2021)    eGFR  Date Value Ref Range Status  03/12/2023 74 >59 mL/min/1.73 Final         Passed - B12 Level in normal range and within 720 days    Vitamin B-12  Date Value Ref Range Status  03/12/2023 554 232 - 1,245 pg/mL Final         Passed - Valid encounter within last 6 months    Recent Outpatient Visits           Today Type 2 diabetes mellitus with polyneuropathy [facility])   [facility] Jackolyn Confer, MD   3 weeks ago Type 2 diabetes mellitus with polyneuropathy [facility])   [facility] Jackolyn Confer, MD       Future Appointments             In 1 month Evelene Croon, Atilano Median, MD [facility], [facility]            Passed - CBC within normal limits and completed in the last 12 months    WBC  Date Value Ref Range Status  03/12/2023 9.0 3.4 - 10.8 x10E3/uL Final  01/31/2023 9.8 4.0 - 10.5 K/uL Final   RBC  Date Value Ref Range Status  03/12/2023 4.20 4.14 - 5.80 x10E6/uL Final  01/31/2023 4.48 4.22 - 5.81 MIL/uL Final   Hemoglobin  Date Value Ref Range Status  03/12/2023 11.7 (L) 13.0 - 17.7 g/dL Final    Hematocrit  Date Value Ref Range Status  03/12/2023 35.2 (L) 37.5 - 51.0 % Final   MCHC  Date Value Ref Range Status  03/12/2023 33.2 31.5 - 35.7 g/dL Final  41/32/4401 02.7 30.0 - 36.0 g/dL Final   [facility]  Date Value Ref Range Status  03/12/2023 27.9 26.6 - 33.0 pg Final  01/31/2023 27.9 26.0 - 34.0 pg Final   MCV  Date Value Ref Range Status  03/12/2023 84 79 - 97 fL Final  12/23/2013 85 80 - 100 fL Final   No results found for: "PLTCOUNTKUC", "LABPLAT", "POCPLA" RDW  Date Value Ref Range Status  03/12/2023 14.2 11.6 - 15.4 % Final  12/23/2013 13.6 11.5 - 14.5 % Final

## 2023-04-03 LAB — BASIC METABOLIC PANEL
BUN/Creatinine Ratio: 15 (ref 9–20)
BUN: 20 mg/dL (ref 6–24)
CO2: 24 mmol/L (ref 20–29)
Calcium: 9.8 mg/dL (ref 8.7–10.2)
Chloride: 102 mmol/L (ref 96–106)
Creatinine, Ser: 1.31 mg/dL — ABNORMAL HIGH (ref 0.76–1.27)
Glucose: 81 mg/dL (ref 70–99)
Potassium: 4.6 mmol/L (ref 3.5–5.2)
Sodium: 142 mmol/L (ref 134–144)
eGFR: 66 mL/min/{1.73_m2} (ref >59)

## 2023-04-06 ENCOUNTER — Other Ambulatory Visit: Payer: Self-pay

## 2023-04-06 MED ORDER — DEXCOM G6 SENSOR MISC: 11 refills | Status: DC

## 2023-04-06 MED ORDER — DEXCOM G6 RECEIVER DEVI: 0 refills | Status: DC

## 2023-04-06 MED ORDER — DEXCOM G6 TRANSMITTER MISC: 3 refills | Status: DC

## 2023-04-06 NOTE — Progress Notes (Signed)
04/06/2023  Patient ID: Gabriel Shaw, male   DOB: January 04, 1971, 52 y.o.   MRN: [id number]  Clinic routed request from Dr. Evelene Croon to assist with getting patient Dexcom CGM.  Upon looking at insurance formulary, Dexcom G6 or G7 are preferred; and Dr. Evelene Croon wishes to prescribe G6 based on this.  Completed prior authorization in CoverMyMeds, which was approved.  Pending orders for Dr. Evelene Croon to sign for Dexcom G6 reader, sensors and transmitter to go to patient's [facility] for processing.  Lenna Gilford, PharmD, DPLA

## 2023-04-12 ENCOUNTER — Other Ambulatory Visit: Payer: Medicaid Other

## 2023-04-12 MED ORDER — OZEMPIC (0.25 OR 0.5 MG/DOSE) 2 MG/3ML ~~LOC~~ SOPN: 0.5000 mg | PEN_INJECTOR | SUBCUTANEOUS | 0 refills | Status: DC

## 2023-04-12 NOTE — Progress Notes (Unsigned)
04/12/2023  Patient ID: Gabriel Shaw, male   DOB: [birth year], 53 y.o.   MRN: [id number]  S/O Patient outreach to follow-up on control of diabetes and tolerance/efficacy of Ozempic  Diabetes Management Plan -Current medications:  Lantus 40 units daily, Novolog 15 units TID before meals, metformin 1000mg  BID -Patient is using Dexcom G6 for CGM; this was just recently prescribed and picked up.  He states he did waste 1 sensor due to an error when first trying to use the system.  He is also not able to use the app on his phone, because his Android device is not compatible. Other than that, he is doing well with the Dexcom. -Last Thursday was 4th week at [facility] 0.25mg  weekly, and he continues to tolerate this well -He does endorse recent fluctuations around BG, stating readings were elevated most of Thursday evening through Saturday (>200); but then he bottomed out at 48 Saturday night.  Patient does endorse taking correction factor Novlog to bring BG down. States he ate a sandwich but continued to be symptomatic, so he had couple servings of orange juice which finally resolve the hypoglycemia. -Patient has glucagon pen on hand, but he was not able to read/understand administration directions well  A/P  Diabetes Management Plan -Recommend increasing Ozempic to 0.5mg  weekly starting with dose this Thursday.  Current pen will have 2 doses of 0.5mg  remaining, and there is not a refill on the prescription.  Pending refill for 1 pend of Ozempic 0.25/0.5mg , so patient can complete 4 weeks at this dose before increasing further. -Decrease Lantus to 36 units daily and Novolog to 13 units TID before meals once Ozempic is increased to 0.5mg  weekly -Discussed importance of eating a meal after Novolog injections to prevent hypoglycemia -Recommended that patient get OTC glucose tablets and educated on rule of 15's  -Suggested that patient make note of FBG and post-prandial values for [place] to look at during  next appointment -Follow-up scheduled for 11/4, which is a day I will be at [facility]- sending MyChart message to see if we can change to in-person.  This way I can assist with Dexcom and Glucagon.  Follow-up:  11/4  Lenna Gilford, PharmD, DPLA

## 2023-04-13 DIAGNOSIS — E1142 Type 2 diabetes mellitus with diabetic polyneuropathy: Secondary | ICD-10-CM | POA: Diagnosis not present

## 2023-04-19 DIAGNOSIS — S92354G Nondisplaced fracture of fifth metatarsal bone, right foot, subsequent encounter for fracture with delayed healing: Secondary | ICD-10-CM | POA: Diagnosis not present

## 2023-04-19 DIAGNOSIS — M14679 Charcot's joint, unspecified ankle and foot: Secondary | ICD-10-CM | POA: Diagnosis not present

## 2023-04-19 DIAGNOSIS — L905 Scar conditions and fibrosis of skin: Secondary | ICD-10-CM | POA: Diagnosis not present

## 2023-04-19 DIAGNOSIS — Z7689 Persons encountering health services in other specified circumstances: Secondary | ICD-10-CM | POA: Diagnosis not present

## 2023-04-19 DIAGNOSIS — H547 Unspecified visual loss: Secondary | ICD-10-CM | POA: Diagnosis not present

## 2023-04-19 DIAGNOSIS — M14671 Charcot's joint, right ankle and foot: Secondary | ICD-10-CM | POA: Diagnosis not present

## 2023-04-19 DIAGNOSIS — Z872 Personal history of diseases of the skin and subcutaneous tissue: Secondary | ICD-10-CM | POA: Diagnosis not present

## 2023-04-19 DIAGNOSIS — M79671 Pain in right foot: Secondary | ICD-10-CM | POA: Diagnosis not present

## 2023-04-19 DIAGNOSIS — E785 Hyperlipidemia, unspecified: Secondary | ICD-10-CM | POA: Diagnosis not present

## 2023-04-19 DIAGNOSIS — E1142 Type 2 diabetes mellitus with diabetic polyneuropathy: Secondary | ICD-10-CM | POA: Diagnosis not present

## 2023-04-19 DIAGNOSIS — S92351A Displaced fracture of fifth metatarsal bone, right foot, initial encounter for closed fracture: Secondary | ICD-10-CM | POA: Diagnosis not present

## 2023-04-19 DIAGNOSIS — I959 Hypotension, unspecified: Secondary | ICD-10-CM | POA: Diagnosis not present

## 2023-04-19 DIAGNOSIS — Z87891 Personal history of nicotine dependence: Secondary | ICD-10-CM | POA: Diagnosis not present

## 2023-04-19 DIAGNOSIS — E1159 Type 2 diabetes mellitus with other circulatory complications: Secondary | ICD-10-CM | POA: Diagnosis not present

## 2023-04-19 DIAGNOSIS — M21071 Valgus deformity, not elsewhere classified, right ankle: Secondary | ICD-10-CM | POA: Diagnosis not present

## 2023-04-19 DIAGNOSIS — Z792 Long term (current) use of antibiotics: Secondary | ICD-10-CM | POA: Diagnosis not present

## 2023-04-19 DIAGNOSIS — E1151 Type 2 diabetes mellitus with diabetic peripheral angiopathy without gangrene: Secondary | ICD-10-CM | POA: Diagnosis not present

## 2023-04-22 DIAGNOSIS — Z7689 Persons encountering health services in other specified circumstances: Secondary | ICD-10-CM | POA: Diagnosis not present

## 2023-04-27 DIAGNOSIS — M12871 Other specific arthropathies, not elsewhere classified, right ankle and foot: Secondary | ICD-10-CM | POA: Diagnosis not present

## 2023-04-27 DIAGNOSIS — S92351A Displaced fracture of fifth metatarsal bone, right foot, initial encounter for closed fracture: Secondary | ICD-10-CM | POA: Diagnosis not present

## 2023-04-27 DIAGNOSIS — Z7689 Persons encountering health services in other specified circumstances: Secondary | ICD-10-CM | POA: Diagnosis not present

## 2023-04-30 DIAGNOSIS — Z419 Encounter for procedure for purposes other than remedying health state, unspecified: Secondary | ICD-10-CM | POA: Diagnosis not present

## 2023-05-03 ENCOUNTER — Other Ambulatory Visit: Payer: Medicaid Other

## 2023-05-03 NOTE — Progress Notes (Unsigned)
   05/03/2023  Patient ID: Gabriel Shaw, male   DOB: Sep 29, 1970, 52 y.o.   MRN: [id number]  S/O Telephone visit to follow-up on management of T2DM  Diabetes Management Plan -Current medications:  Ozempic 0.5mg  weekly, Lantus 36 units daily, Novolog 13 units TID? -Using Dexcom G6 for CGM; FBG-, post-prandial BG- -States losing signal on occasion, particularly overnight -A1c 12.3% on 9/19  A/P  Diabetes Management Plan -Currently uncontrolled -Sees Dr. Evelene Croon again 11/19  Follow-up:  Lenna Gilford, PharmD, DPLA

## 2023-05-17 DIAGNOSIS — Z7689 Persons encountering health services in other specified circumstances: Secondary | ICD-10-CM | POA: Diagnosis not present

## 2023-05-17 DIAGNOSIS — M79671 Pain in right foot: Secondary | ICD-10-CM | POA: Diagnosis not present

## 2023-05-17 DIAGNOSIS — S92351A Displaced fracture of fifth metatarsal bone, right foot, initial encounter for closed fracture: Secondary | ICD-10-CM | POA: Diagnosis not present

## 2023-05-17 DIAGNOSIS — E11610 Type 2 diabetes mellitus with diabetic neuropathic arthropathy: Secondary | ICD-10-CM | POA: Diagnosis not present

## 2023-05-17 DIAGNOSIS — Q661 Congenital talipes calcaneovarus, unspecified foot: Secondary | ICD-10-CM | POA: Diagnosis not present

## 2023-05-17 DIAGNOSIS — M14671 Charcot's joint, right ankle and foot: Secondary | ICD-10-CM | POA: Diagnosis not present

## 2023-05-18 ENCOUNTER — Ambulatory Visit (INDEPENDENT_AMBULATORY_CARE_PROVIDER_SITE_OTHER): Payer: Medicaid Other | Admitting: Pediatrics

## 2023-05-18 VITALS — BP 97/65 | HR 94 | Ht 73.0 in | Wt 236.4 lb

## 2023-05-18 DIAGNOSIS — E1142 Type 2 diabetes mellitus with diabetic polyneuropathy: Secondary | ICD-10-CM

## 2023-05-18 DIAGNOSIS — Z7985 Long-term (current) use of injectable non-insulin antidiabetic drugs: Secondary | ICD-10-CM | POA: Diagnosis not present

## 2023-05-18 DIAGNOSIS — Z1330 Encounter for screening examination for mental health and behavioral disorders, unspecified: Secondary | ICD-10-CM

## 2023-05-18 MED ORDER — DEXCOM G6 SENSOR MISC
11 refills | Status: DC
Start: 2023-05-18 — End: 2023-07-05
  Filled 2023-06-16: qty 3, 30d supply, fill #0

## 2023-05-18 NOTE — Progress Notes (Signed)
Office Visit  BP 97/65   Pulse 94   Ht 6\' 1"  (1.854 m)   Wt 236 lb 6.4 oz (107.2 kg)   SpO2 99%   BMI 31.19 kg/m    Subjective:    Patient ID: Gabriel Shaw, male    DOB: January 04, 1971, 52 y.o.   MRN: [id number]  HPI: Gabriel Shaw is a 52 y.o. male  Chief Complaint  Patient presents with   Diabetes    Patient most recent Diabetic Eye Exam was requested at today's visit.    Peripheral Neuropathy    Patient says he has noticed some tingling and numbness in his R arm (bicep area) and radiates down into his fingers and palm of his hand. Patient says the issue comes and goes, but says it has been more frequent last couple of weeks.    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 Requesting CGM sensors  Relevant past medical, surgical, family and social history reviewed and updated as indicated. Interim medical history since our last visit reviewed. Allergies and medications reviewed and updated.  ROS per HPI unless specifically indicated above     Objective:    BP 97/65   Pulse 94   Ht 6\' 1"  (1.854 m)   Wt 236 lb 6.4 oz (107.2 kg)   SpO2 99%   BMI 31.19 kg/m   Wt Readings from Last 3 Encounters:  05/18/23 236 lb 6.4 oz (107.2 kg)  03/12/23 242 lb (109.8 kg)  01/31/23 230 lb (104.3 kg)     Physical Exam Constitutional:      Appearance: Normal appearance.  HENT:     Head: Normocephalic and atraumatic.  Eyes:     Pupils: Pupils are equal, round, and reactive to light.  Cardiovascular:     Rate and Rhythm: Normal rate and regular rhythm.     Pulses: Normal  pulses.     Heart sounds: Normal heart sounds.  Pulmonary:     Effort: Pulmonary effort is normal.     Breath sounds: Normal breath sounds.  Musculoskeletal:        General: Normal range of motion.     Cervical back: Normal range of motion.  Skin:    General: Skin is warm and dry.     Capillary Refill: Capillary refill takes less than 2 seconds.  Neurological:     General: No focal deficit present.     Mental Status: He is alert. Mental status is at baseline.  Psychiatric:        Mood and Affect: Mood normal.        Behavior: Behavior normal.         03/12/2023    8:52 AM  Depression screen PHQ 2/9  Decreased Interest 3  Down, Depressed, Hopeless 3  PHQ - 2 Score 6  Altered sleeping 3  Tired, decreased energy 3  Change in appetite 3  Feeling bad or failure about yourself  3  Trouble concentrating 3  Moving slowly or fidgety/restless 3  Suicidal thoughts 3  PHQ-9 Score 27  Difficult doing work/chores Very difficult       03/12/2023  8:57 AM  GAD 7 : Generalized Anxiety Score  Nervous, Anxious, on Edge 3  Control/stop worrying 2  Worry too much - different things 0  Trouble relaxing 3  Restless 0  Easily annoyed or irritable 3  Afraid - awful might happen 0  Total GAD 7 Score 11  Anxiety Difficulty Very difficult       Assessment & Plan:  Assessment & Plan   Type 2 diabetes mellitus with polyneuropathy (HCC) Remains uncontrolled. Unable to review CGM data due to battery issue. He continues to have episodes of hypoglycemia previously reported to Clinical Pharmacist. He has not stopped correction insulin for stressful moments as previously recommended, I emphasized to stop correcting. He was previously recommended to decrease lantus to 30units and 10u TID mealtime. He has only decreased lantus so far. I recommended decreasing to 10units again. He continues 0.5 ozempic, will be due for increase, tolerating well. Fill sent. Educated on hypoglycemia. Will  additionally send message to clinical pharmacist regarding conversation today. Will have him check A1c prior to pharmacy visit since last checked 2 months ago (future order placed for A1c and u/a). Consider low dose ACE or ARB for kidney protection. Continue following with optho and podiatry. -     Dexcom G6 Sensor; Change sensor every 10 days  Dispense: 3 each; Refill: 11 -     Semaglutide (1 MG/DOSE); Inject 1 mg as directed once a week.  Dispense: 3 mL; Refill: 0  Encounter for behavioral health screening As part of their intake evaluation, the patient was screened for depression, anxiety.  PHQ9 SCORE 27, GAD7 SCORE 11. Screening results positive for tested conditions. Declines treatment.   Follow up plan: Return in about 2 months (around 07/18/2023) for DM.  Roald Lukacs Howell Pringle, MD

## 2023-05-18 NOTE — Patient Instructions (Addendum)
Continue Lantus 30u Meal time insulin - reduce to 10units

## 2023-05-22 ENCOUNTER — Encounter: Payer: Self-pay | Admitting: Pediatrics

## 2023-05-22 MED ORDER — SEMAGLUTIDE (1 MG/DOSE) 4 MG/3ML ~~LOC~~ SOPN: 1.0000 mg | PEN_INJECTOR | SUBCUTANEOUS | 0 refills | Status: DC

## 2023-05-24 DIAGNOSIS — E785 Hyperlipidemia, unspecified: Secondary | ICD-10-CM | POA: Diagnosis not present

## 2023-05-24 DIAGNOSIS — E1151 Type 2 diabetes mellitus with diabetic peripheral angiopathy without gangrene: Secondary | ICD-10-CM | POA: Diagnosis not present

## 2023-05-24 DIAGNOSIS — L97412 Non-pressure chronic ulcer of right heel and midfoot with fat layer exposed: Secondary | ICD-10-CM | POA: Diagnosis not present

## 2023-05-24 DIAGNOSIS — E11610 Type 2 diabetes mellitus with diabetic neuropathic arthropathy: Secondary | ICD-10-CM | POA: Diagnosis not present

## 2023-05-24 DIAGNOSIS — M19071 Primary osteoarthritis, right ankle and foot: Secondary | ICD-10-CM | POA: Diagnosis not present

## 2023-05-24 DIAGNOSIS — Z720 Tobacco use: Secondary | ICD-10-CM | POA: Diagnosis not present

## 2023-05-24 DIAGNOSIS — E1142 Type 2 diabetes mellitus with diabetic polyneuropathy: Secondary | ICD-10-CM | POA: Diagnosis not present

## 2023-05-24 DIAGNOSIS — I10 Essential (primary) hypertension: Secondary | ICD-10-CM | POA: Diagnosis not present

## 2023-05-24 DIAGNOSIS — I959 Hypotension, unspecified: Secondary | ICD-10-CM | POA: Diagnosis not present

## 2023-05-24 DIAGNOSIS — I739 Peripheral vascular disease, unspecified: Secondary | ICD-10-CM | POA: Diagnosis not present

## 2023-05-24 DIAGNOSIS — E11621 Type 2 diabetes mellitus with foot ulcer: Secondary | ICD-10-CM | POA: Diagnosis not present

## 2023-05-24 DIAGNOSIS — Z794 Long term (current) use of insulin: Secondary | ICD-10-CM | POA: Diagnosis not present

## 2023-05-24 DIAGNOSIS — Z7689 Persons encountering health services in other specified circumstances: Secondary | ICD-10-CM | POA: Diagnosis not present

## 2023-06-07 DIAGNOSIS — E1140 Type 2 diabetes mellitus with diabetic neuropathy, unspecified: Secondary | ICD-10-CM | POA: Diagnosis not present

## 2023-06-07 DIAGNOSIS — I739 Peripheral vascular disease, unspecified: Secondary | ICD-10-CM | POA: Diagnosis not present

## 2023-06-07 DIAGNOSIS — L97412 Non-pressure chronic ulcer of right heel and midfoot with fat layer exposed: Secondary | ICD-10-CM | POA: Diagnosis not present

## 2023-06-07 DIAGNOSIS — Z87891 Personal history of nicotine dependence: Secondary | ICD-10-CM | POA: Diagnosis not present

## 2023-06-07 DIAGNOSIS — E785 Hyperlipidemia, unspecified: Secondary | ICD-10-CM | POA: Diagnosis not present

## 2023-06-07 DIAGNOSIS — E11621 Type 2 diabetes mellitus with foot ulcer: Secondary | ICD-10-CM | POA: Diagnosis not present

## 2023-06-07 DIAGNOSIS — L97422 Non-pressure chronic ulcer of left heel and midfoot with fat layer exposed: Secondary | ICD-10-CM | POA: Diagnosis not present

## 2023-06-07 DIAGNOSIS — M25571 Pain in right ankle and joints of right foot: Secondary | ICD-10-CM | POA: Diagnosis not present

## 2023-06-07 DIAGNOSIS — M14671 Charcot's joint, right ankle and foot: Secondary | ICD-10-CM | POA: Diagnosis not present

## 2023-06-07 DIAGNOSIS — E1151 Type 2 diabetes mellitus with diabetic peripheral angiopathy without gangrene: Secondary | ICD-10-CM | POA: Diagnosis not present

## 2023-06-09 DIAGNOSIS — S92351D Displaced fracture of fifth metatarsal bone, right foot, subsequent encounter for fracture with routine healing: Secondary | ICD-10-CM | POA: Diagnosis not present

## 2023-06-09 DIAGNOSIS — M25571 Pain in right ankle and joints of right foot: Secondary | ICD-10-CM | POA: Diagnosis not present

## 2023-06-09 DIAGNOSIS — M14671 Charcot's joint, right ankle and foot: Secondary | ICD-10-CM | POA: Diagnosis not present

## 2023-06-09 DIAGNOSIS — L97519 Non-pressure chronic ulcer of other part of right foot with unspecified severity: Secondary | ICD-10-CM | POA: Diagnosis not present

## 2023-06-10 DIAGNOSIS — E113552 Type 2 diabetes mellitus with stable proliferative diabetic retinopathy, left eye: Secondary | ICD-10-CM | POA: Diagnosis not present

## 2023-06-10 DIAGNOSIS — H2513 Age-related nuclear cataract, bilateral: Secondary | ICD-10-CM | POA: Diagnosis not present

## 2023-06-10 DIAGNOSIS — H3589 Other specified retinal disorders: Secondary | ICD-10-CM | POA: Diagnosis not present

## 2023-06-11 ENCOUNTER — Other Ambulatory Visit: Payer: Medicaid Other

## 2023-06-11 DIAGNOSIS — E1142 Type 2 diabetes mellitus with diabetic polyneuropathy: Secondary | ICD-10-CM | POA: Diagnosis not present

## 2023-06-11 LAB — URINALYSIS, ROUTINE W REFLEX MICROSCOPIC
Bilirubin, UA: NEGATIVE
Glucose, UA: NEGATIVE
Ketones, UA: NEGATIVE
Leukocytes,UA: NEGATIVE
Nitrite, UA: NEGATIVE
Specific Gravity, UA: 1.03 (ref 1.005–1.030)
Urobilinogen, Ur: 0.2 mg/dL (ref 0.2–1.0)
pH, UA: 5.5 (ref 5.0–7.5)

## 2023-06-11 LAB — MICROSCOPIC EXAMINATION
Bacteria, UA: NONE SEEN
WBC, UA: NONE SEEN /[HPF] (ref 0–5)

## 2023-06-12 LAB — HEMOGLOBIN A1C
Est. average glucose Bld gHb Est-mCnc: 154 mg/dL
Hgb A1c MFr Bld: 7 % — ABNORMAL HIGH (ref 4.8–5.6)

## 2023-06-15 ENCOUNTER — Other Ambulatory Visit: Payer: Self-pay

## 2023-06-15 NOTE — Progress Notes (Unsigned)
   06/15/2023  Patient ID: Gabriel Shaw, male   DOB: Jul 22, 1970, 52 y.o.   MRN: [id number]  S/O Patient outreach to follow-up on control of diabetes   Diabetes Management Plan -Current medications:  Ozempic 1mg  weekly, metformin 1000mg  BID, Lantus 30 units daily, Novolog 10 units TID with meals -Patient increased Ozempic to 1mg  and decreased TDD of insulin by 20% approximately 4 weeks ago -Endorse 1 suspected occurrence of hypoglycemia in which his mother found him unconscious and EMTs were called; patient states he refused going to the hospital for evaluation but believes insulin was given.  States he was wearing Dexcom sensor when this occurred, and it did not alarm that BG was going low. -Patient has been without Dexcom sensor for a few days this week, because it fell off; but he plans to pick up this week when he has transportation.  He does not have testing supplies at home to check BG in case of sensor issues. -Patient does not have Glucagon on hand -Endorses BG averaging 150-170 and states prior endocrinologist gave FBG goal of 60-90 -A1c 7.0% 4 days ago  A/P  Diabetes Management Plan -Unclear if recent syncope due to hypoglycemia, but it is highly likely.  Patient previous would "correct" high BG readings with additional insulin, so I have reiterated to not do this based on risk of lows. -Pending order to testing supplies and glucagon for Dr. Evelene Croon to sign if in agreement -Patient would like to transfer prescriptions to [facility], so he can be set up for home delivery based on transportation barriers. I have updated preferred pharmacy on profile to [facility] and will coordinate with pharmacy and provider(s) to get transfers/new prescriptions over to the pharmacy.  Follow-up:  2 weeks  Lenna Gilford, PharmD, DPLA

## 2023-06-16 ENCOUNTER — Other Ambulatory Visit: Payer: Self-pay

## 2023-06-16 ENCOUNTER — Other Ambulatory Visit (HOSPITAL_COMMUNITY): Payer: Self-pay

## 2023-06-16 MED ORDER — GLUCAGON 1 MG/0.2ML ~~LOC~~ SOLN
SUBCUTANEOUS | 1 refills | Status: DC
  Filled 2023-06-16: qty 0.2, 1d supply, fill #0

## 2023-06-16 MED ORDER — ACCU-CHEK AVIVA PLUS VI STRP
ORAL_STRIP | 12 refills | Status: DC
  Filled 2023-06-16: qty 100, 90d supply, fill #0

## 2023-06-16 MED ORDER — ACCU-CHEK AVIVA PLUS W/DEVICE KIT
PACK | 0 refills | Status: DC
  Filled 2023-06-16: qty 1, 30d supply, fill #0

## 2023-06-16 MED ORDER — ACCU-CHEK SOFTCLIX LANCETS MISC
12 refills | Status: DC
  Filled 2023-06-16: qty 100, 90d supply, fill #0

## 2023-06-17 ENCOUNTER — Other Ambulatory Visit: Payer: Self-pay

## 2023-06-17 ENCOUNTER — Other Ambulatory Visit: Payer: Self-pay | Admitting: Pediatrics

## 2023-06-17 ENCOUNTER — Other Ambulatory Visit (HOSPITAL_COMMUNITY): Payer: Self-pay

## 2023-06-17 DIAGNOSIS — E1142 Type 2 diabetes mellitus with diabetic polyneuropathy: Secondary | ICD-10-CM

## 2023-06-17 NOTE — Telephone Encounter (Signed)
Requested Prescriptions  Pending Prescriptions Disp Refills   OZEMPIC, 1 MG/DOSE, 4 MG/3ML SOPN [Pharmacy Med Name: Ozempic (1 MG/DOSE) 4 MG/3ML Subcutaneous Solution Pen-injector] 3 mL 0    Sig: INJECT 1 MG UNDER THE SKIN ONCE WEEKLY AS DIRECTED     Endocrinology:  Diabetes - GLP-1 Receptor Agonists - semaglutide Failed - 06/17/2023 11:58 AM      Failed - HBA1C in normal range and within 180 days    Hemoglobin A1C  Date Value Ref Range Status  12/23/2013 10.4 (H) 4.2 - 6.3 % Final    Comment:    The American Diabetes Association recommends that a primary goal of therapy should be <7% and that physicians should reevaluate the treatment regimen in patients with HbA1c values consistently >8%.    Hgb A1c MFr Bld  Date Value Ref Range Status  06/11/2023 7.0 (H) 4.8 - 5.6 % Final    Comment:             Prediabetes: 5.7 - 6.4          Diabetes: >6.4          Glycemic control for adults with diabetes: <7.0          Failed - Cr in normal range and within 360 days    Creatinine  Date Value Ref Range Status  12/23/2013 1.36 (H) 0.60 - 1.30 mg/dL Final   Creatinine, Ser  Date Value Ref Range Status  04/02/2023 1.31 (H) 0.76 - 1.27 mg/dL Final         Passed - Valid encounter within last 6 months    Recent Outpatient Visits           1 month ago Type 2 diabetes mellitus with polyneuropathy [facility])   [place] [facility] Jackolyn Confer, MD   2 months ago Type 2 diabetes mellitus with polyneuropathy [facility])   [place] Jackolyn Confer, MD   3 months ago Type 2 diabetes mellitus with polyneuropathy [facility])   [facility] Jackolyn Confer, MD       Future Appointments             In 1 month Evelene Croon, Atilano Median, MD  [facility]

## 2023-06-18 DIAGNOSIS — I999 Unspecified disorder of circulatory system: Secondary | ICD-10-CM | POA: Diagnosis not present

## 2023-06-18 DIAGNOSIS — Q661 Congenital talipes calcaneovarus, unspecified foot: Secondary | ICD-10-CM | POA: Diagnosis not present

## 2023-06-18 DIAGNOSIS — L97519 Non-pressure chronic ulcer of other part of right foot with unspecified severity: Secondary | ICD-10-CM | POA: Diagnosis not present

## 2023-06-22 ENCOUNTER — Other Ambulatory Visit: Payer: Self-pay

## 2023-06-25 ENCOUNTER — Other Ambulatory Visit (HOSPITAL_COMMUNITY): Payer: Self-pay

## 2023-06-25 ENCOUNTER — Other Ambulatory Visit: Payer: Self-pay

## 2023-06-25 ENCOUNTER — Ambulatory Visit
Admission: EM | Admit: 2023-06-25 | Discharge: 2023-06-25 | Disposition: A | Discharge status: Home / Self Care | Payer: Medicaid Other | Attending: Emergency Medicine | Admitting: Emergency Medicine

## 2023-06-25 ENCOUNTER — Encounter (HOSPITAL_COMMUNITY): Payer: Self-pay

## 2023-06-25 ENCOUNTER — Encounter
Admission: EM | Disposition: A | Discharge status: Other Acute Inpatient Hospital | Payer: Self-pay | Source: Home / Self Care | Attending: Emergency Medicine

## 2023-06-25 ENCOUNTER — Telehealth (HOSPITAL_COMMUNITY): Payer: Self-pay | Admitting: Pharmacy Technician

## 2023-06-25 ENCOUNTER — Inpatient Hospital Stay (HOSPITAL_COMMUNITY): Payer: Medicaid Other

## 2023-06-25 DIAGNOSIS — Z87891 Personal history of nicotine dependence: Secondary | ICD-10-CM | POA: Insufficient documentation

## 2023-06-25 DIAGNOSIS — Z7984 Long term (current) use of oral hypoglycemic drugs: Secondary | ICD-10-CM

## 2023-06-25 DIAGNOSIS — I2102 ST elevation (STEMI) myocardial infarction involving left anterior descending coronary artery: Secondary | ICD-10-CM

## 2023-06-25 DIAGNOSIS — R001 Bradycardia, unspecified: Secondary | ICD-10-CM | POA: Diagnosis not present

## 2023-06-25 DIAGNOSIS — E1122 Type 2 diabetes mellitus with diabetic chronic kidney disease: Secondary | ICD-10-CM | POA: Diagnosis present

## 2023-06-25 DIAGNOSIS — R079 Chest pain, unspecified: Secondary | ICD-10-CM | POA: Diagnosis not present

## 2023-06-25 DIAGNOSIS — E11621 Type 2 diabetes mellitus with foot ulcer: Secondary | ICD-10-CM | POA: Diagnosis present

## 2023-06-25 DIAGNOSIS — N179 Acute kidney failure, unspecified: Secondary | ICD-10-CM | POA: Diagnosis present

## 2023-06-25 DIAGNOSIS — I509 Heart failure, unspecified: Secondary | ICD-10-CM

## 2023-06-25 DIAGNOSIS — E119 Type 2 diabetes mellitus without complications: Secondary | ICD-10-CM | POA: Diagnosis not present

## 2023-06-25 DIAGNOSIS — R1013 Epigastric pain: Secondary | ICD-10-CM | POA: Diagnosis not present

## 2023-06-25 DIAGNOSIS — Z7189 Other specified counseling: Secondary | ICD-10-CM | POA: Diagnosis not present

## 2023-06-25 DIAGNOSIS — I469 Cardiac arrest, cause unspecified: Secondary | ICD-10-CM | POA: Diagnosis not present

## 2023-06-25 DIAGNOSIS — T82818A Embolism of vascular prosthetic devices, implants and grafts, initial encounter: Secondary | ICD-10-CM | POA: Diagnosis not present

## 2023-06-25 DIAGNOSIS — I25112 Atherosclerotic heart disease of native coronary artery with refractory angina pectoris: Secondary | ICD-10-CM | POA: Diagnosis not present

## 2023-06-25 DIAGNOSIS — J9690 Respiratory failure, unspecified, unspecified whether with hypoxia or hypercapnia: Secondary | ICD-10-CM | POA: Diagnosis not present

## 2023-06-25 DIAGNOSIS — G9341 Metabolic encephalopathy: Secondary | ICD-10-CM | POA: Diagnosis present

## 2023-06-25 DIAGNOSIS — I472 Ventricular tachycardia, unspecified: Secondary | ICD-10-CM | POA: Diagnosis present

## 2023-06-25 DIAGNOSIS — R739 Hyperglycemia, unspecified: Secondary | ICD-10-CM | POA: Diagnosis not present

## 2023-06-25 DIAGNOSIS — I2584 Coronary atherosclerosis due to calcified coronary lesion: Secondary | ICD-10-CM | POA: Diagnosis present

## 2023-06-25 DIAGNOSIS — N182 Chronic kidney disease, stage 2 (mild): Secondary | ICD-10-CM | POA: Diagnosis present

## 2023-06-25 DIAGNOSIS — Z5982 Transportation insecurity: Secondary | ICD-10-CM

## 2023-06-25 DIAGNOSIS — Z5941 Food insecurity: Secondary | ICD-10-CM

## 2023-06-25 DIAGNOSIS — R112 Nausea with vomiting, unspecified: Secondary | ICD-10-CM | POA: Insufficient documentation

## 2023-06-25 DIAGNOSIS — I5021 Acute systolic (congestive) heart failure: Secondary | ICD-10-CM | POA: Diagnosis present

## 2023-06-25 DIAGNOSIS — I513 Intracardiac thrombosis, not elsewhere classified: Secondary | ICD-10-CM | POA: Diagnosis present

## 2023-06-25 DIAGNOSIS — I493 Ventricular premature depolarization: Secondary | ICD-10-CM | POA: Diagnosis present

## 2023-06-25 DIAGNOSIS — Z5986 Financial insecurity: Secondary | ICD-10-CM | POA: Insufficient documentation

## 2023-06-25 DIAGNOSIS — Z833 Family history of diabetes mellitus: Secondary | ICD-10-CM

## 2023-06-25 DIAGNOSIS — A419 Sepsis, unspecified organism: Principal | ICD-10-CM | POA: Diagnosis not present

## 2023-06-25 DIAGNOSIS — I2119 ST elevation (STEMI) myocardial infarction involving other coronary artery of inferior wall: Secondary | ICD-10-CM | POA: Diagnosis present

## 2023-06-25 DIAGNOSIS — I10 Essential (primary) hypertension: Secondary | ICD-10-CM | POA: Diagnosis not present

## 2023-06-25 DIAGNOSIS — L97519 Non-pressure chronic ulcer of other part of right foot with unspecified severity: Secondary | ICD-10-CM | POA: Diagnosis present

## 2023-06-25 DIAGNOSIS — Y832 Surgical operation with anastomosis, bypass or graft as the cause of abnormal reaction of the patient, or of later complication, without mention of misadventure at the time of the procedure: Secondary | ICD-10-CM | POA: Diagnosis not present

## 2023-06-25 DIAGNOSIS — R6521 Severe sepsis with septic shock: Secondary | ICD-10-CM | POA: Diagnosis present

## 2023-06-25 DIAGNOSIS — Z79899 Other long term (current) drug therapy: Secondary | ICD-10-CM

## 2023-06-25 DIAGNOSIS — D62 Acute posthemorrhagic anemia: Secondary | ICD-10-CM | POA: Diagnosis not present

## 2023-06-25 DIAGNOSIS — R570 Cardiogenic shock: Principal | ICD-10-CM | POA: Diagnosis present

## 2023-06-25 DIAGNOSIS — Z7985 Long-term (current) use of injectable non-insulin antidiabetic drugs: Secondary | ICD-10-CM | POA: Diagnosis not present

## 2023-06-25 DIAGNOSIS — Z7982 Long term (current) use of aspirin: Secondary | ICD-10-CM

## 2023-06-25 DIAGNOSIS — E785 Hyperlipidemia, unspecified: Secondary | ICD-10-CM | POA: Diagnosis present

## 2023-06-25 DIAGNOSIS — I501 Left ventricular failure: Secondary | ICD-10-CM | POA: Diagnosis not present

## 2023-06-25 DIAGNOSIS — K219 Gastro-esophageal reflux disease without esophagitis: Secondary | ICD-10-CM | POA: Diagnosis present

## 2023-06-25 DIAGNOSIS — Z794 Long term (current) use of insulin: Secondary | ICD-10-CM

## 2023-06-25 DIAGNOSIS — R3129 Other microscopic hematuria: Secondary | ICD-10-CM | POA: Diagnosis present

## 2023-06-25 DIAGNOSIS — I255 Ischemic cardiomyopathy: Secondary | ICD-10-CM | POA: Diagnosis present

## 2023-06-25 DIAGNOSIS — D696 Thrombocytopenia, unspecified: Secondary | ICD-10-CM | POA: Diagnosis present

## 2023-06-25 DIAGNOSIS — Z452 Encounter for adjustment and management of vascular access device: Secondary | ICD-10-CM | POA: Diagnosis not present

## 2023-06-25 DIAGNOSIS — Z821 Family history of blindness and visual loss: Secondary | ICD-10-CM

## 2023-06-25 DIAGNOSIS — I4891 Unspecified atrial fibrillation: Secondary | ICD-10-CM | POA: Diagnosis present

## 2023-06-25 DIAGNOSIS — E440 Moderate protein-calorie malnutrition: Secondary | ICD-10-CM | POA: Insufficient documentation

## 2023-06-25 DIAGNOSIS — Z809 Family history of malignant neoplasm, unspecified: Secondary | ICD-10-CM

## 2023-06-25 DIAGNOSIS — I272 Pulmonary hypertension, unspecified: Secondary | ICD-10-CM | POA: Diagnosis present

## 2023-06-25 DIAGNOSIS — R0789 Other chest pain: Secondary | ICD-10-CM | POA: Diagnosis present

## 2023-06-25 DIAGNOSIS — R34 Anuria and oliguria: Secondary | ICD-10-CM | POA: Diagnosis not present

## 2023-06-25 DIAGNOSIS — I2510 Atherosclerotic heart disease of native coronary artery without angina pectoris: Secondary | ICD-10-CM

## 2023-06-25 DIAGNOSIS — I4901 Ventricular fibrillation: Secondary | ICD-10-CM | POA: Diagnosis present

## 2023-06-25 DIAGNOSIS — I130 Hypertensive heart and chronic kidney disease with heart failure and stage 1 through stage 4 chronic kidney disease, or unspecified chronic kidney disease: Secondary | ICD-10-CM | POA: Diagnosis present

## 2023-06-25 DIAGNOSIS — Z95811 Presence of heart assist device: Secondary | ICD-10-CM

## 2023-06-25 DIAGNOSIS — I213 ST elevation (STEMI) myocardial infarction of unspecified site: Secondary | ICD-10-CM | POA: Diagnosis not present

## 2023-06-25 DIAGNOSIS — R1084 Generalized abdominal pain: Secondary | ICD-10-CM | POA: Diagnosis not present

## 2023-06-25 DIAGNOSIS — Z72820 Sleep deprivation: Secondary | ICD-10-CM

## 2023-06-25 DIAGNOSIS — Z515 Encounter for palliative care: Secondary | ICD-10-CM | POA: Diagnosis not present

## 2023-06-25 DIAGNOSIS — I9589 Other hypotension: Secondary | ICD-10-CM | POA: Diagnosis not present

## 2023-06-25 HISTORY — PX: RIGHT HEART CATH: CATH118263

## 2023-06-25 HISTORY — PX: CORONARY/GRAFT ACUTE MI REVASCULARIZATION: CATH118305

## 2023-06-25 HISTORY — PX: LEFT HEART CATH AND CORONARY ANGIOGRAPHY: CATH118249

## 2023-06-25 HISTORY — PX: VENTRICULAR ASSIST DEVICE INSERTION: CATH118273

## 2023-06-25 LAB — CBC
HCT: 33.1 % — ABNORMAL LOW (ref 39.0–52.0)
Hemoglobin: 11.3 g/dL — ABNORMAL LOW (ref 13.0–17.0)
MCH: 29 pg (ref 26.0–34.0)
MCHC: 34.1 g/dL (ref 30.0–36.0)
MCV: 84.9 fL (ref 80.0–100.0)
Platelets: 336 10*3/uL (ref 150–400)
RBC: 3.9 MIL/uL — ABNORMAL LOW (ref 4.22–5.81)
RDW: 13.5 % (ref 11.5–15.5)
WBC: 19.3 10*3/uL — ABNORMAL HIGH (ref 4.0–10.5)
nRBC: 0 % (ref 0.0–0.2)

## 2023-06-25 LAB — POCT I-STAT EG7
Acid-base deficit: 7 mmol/L — ABNORMAL HIGH (ref 0.0–2.0)
Bicarbonate: 18.6 mmol/L — ABNORMAL LOW (ref 20.0–28.0)
Calcium, Ion: 1.13 mmol/L — ABNORMAL LOW (ref 1.15–1.40)
HCT: 35 % — ABNORMAL LOW (ref 39.0–52.0)
Hemoglobin: 11.9 g/dL — ABNORMAL LOW (ref 13.0–17.0)
O2 Saturation: 53 %
Potassium: 4.4 mmol/L (ref 3.5–5.1)
Sodium: 139 mmol/L (ref 135–145)
TCO2: 20 mmol/L — ABNORMAL LOW (ref 22–32)
pCO2, Ven: 37.3 mm[Hg] — ABNORMAL LOW (ref 44–60)
pH, Ven: 7.306 (ref 7.25–7.43)
pO2, Ven: 30 mm[Hg] — CL (ref 32–45)

## 2023-06-25 LAB — CBC WITH DIFFERENTIAL/PLATELET
Abs Immature Granulocytes: 0.06 10*3/uL (ref 0.00–0.07)
Basophils Absolute: 0 10*3/uL (ref 0.0–0.1)
Basophils Relative: 0 %
Eosinophils Absolute: 0 10*3/uL (ref 0.0–0.5)
Eosinophils Relative: 0 %
HCT: 37.4 % — ABNORMAL LOW (ref 39.0–52.0)
Hemoglobin: 12.4 g/dL — ABNORMAL LOW (ref 13.0–17.0)
Immature Granulocytes: 0 %
Lymphocytes Relative: 9 %
Lymphs Abs: 1.7 10*3/uL (ref 0.7–4.0)
MCH: 28.9 pg (ref 26.0–34.0)
MCHC: 33.2 g/dL (ref 30.0–36.0)
MCV: 87.2 fL (ref 80.0–100.0)
Monocytes Absolute: 1.7 10*3/uL — ABNORMAL HIGH (ref 0.1–1.0)
Monocytes Relative: 9 %
Neutro Abs: 16 10*3/uL — ABNORMAL HIGH (ref 1.7–7.7)
Neutrophils Relative %: 82 %
Platelets: 348 10*3/uL (ref 150–400)
RBC: 4.29 MIL/uL (ref 4.22–5.81)
RDW: 13.4 % (ref 11.5–15.5)
WBC: 19.5 10*3/uL — ABNORMAL HIGH (ref 4.0–10.5)
nRBC: 0 % (ref 0.0–0.2)

## 2023-06-25 LAB — COMPREHENSIVE METABOLIC PANEL
ALT: 67 U/L — ABNORMAL HIGH (ref 0–44)
AST: 328 U/L — ABNORMAL HIGH (ref 15–41)
Albumin: 3.6 g/dL (ref 3.5–5.0)
Alkaline Phosphatase: 60 U/L (ref 38–126)
Anion gap: 15 (ref 5–15)
BUN: 39 mg/dL — ABNORMAL HIGH (ref 6–20)
CO2: 18 mmol/L — ABNORMAL LOW (ref 22–32)
Calcium: 8.7 mg/dL — ABNORMAL LOW (ref 8.9–10.3)
Chloride: 106 mmol/L (ref 98–111)
Creatinine, Ser: 2.42 mg/dL — ABNORMAL HIGH (ref 0.61–1.24)
GFR, Estimated: 31 mL/min — ABNORMAL LOW (ref >60)
Glucose, Bld: 256 mg/dL — ABNORMAL HIGH (ref 70–99)
Potassium: 4.3 mmol/L (ref 3.5–5.1)
Sodium: 139 mmol/L (ref 135–145)
Total Bilirubin: 1.3 mg/dL — ABNORMAL HIGH (ref <1.2)
Total Protein: 7.2 g/dL (ref 6.5–8.1)

## 2023-06-25 LAB — POCT I-STAT 7, (LYTES, BLD GAS, ICA,H+H)
Acid-base deficit: 9 mmol/L — ABNORMAL HIGH (ref 0.0–2.0)
Bicarbonate: 16.4 mmol/L — ABNORMAL LOW (ref 20.0–28.0)
Calcium, Ion: 1.05 mmol/L — ABNORMAL LOW (ref 1.15–1.40)
HCT: 33 % — ABNORMAL LOW (ref 39.0–52.0)
Hemoglobin: 11.2 g/dL — ABNORMAL LOW (ref 13.0–17.0)
O2 Saturation: 97 %
Potassium: 4.2 mmol/L (ref 3.5–5.1)
Sodium: 141 mmol/L (ref 135–145)
TCO2: 17 mmol/L — ABNORMAL LOW (ref 22–32)
pCO2 arterial: 32.8 mm[Hg] (ref 32–48)
pH, Arterial: 7.306 — ABNORMAL LOW (ref 7.35–7.45)
pO2, Arterial: 98 mm[Hg] (ref 83–108)

## 2023-06-25 LAB — LIPID PANEL
Cholesterol: 120 mg/dL (ref 0–200)
HDL: 52 mg/dL (ref >40)
LDL Cholesterol: 52 mg/dL (ref 0–99)
Total CHOL/HDL Ratio: 2.3 {ratio}
Triglycerides: 78 mg/dL (ref <150)
VLDL: 16 mg/dL (ref 0–40)

## 2023-06-25 LAB — APTT: aPTT: 31 s (ref 24–36)

## 2023-06-25 LAB — ECHOCARDIOGRAM COMPLETE
Area-P 1/2: 5.42 cm2
Est EF: <20
Height: 75 in
S' Lateral: 5.05 cm
Weight: 3781.33 [oz_av]

## 2023-06-25 LAB — TROPONIN I (HIGH SENSITIVITY): Troponin I (High Sensitivity): >24000 ng/L (ref <18)

## 2023-06-25 LAB — HIV ANTIBODY (ROUTINE TESTING W REFLEX): HIV Screen 4th Generation wRfx: NONREACTIVE

## 2023-06-25 LAB — CG4 I-STAT (LACTIC ACID): Lactic Acid, Venous: 1.4 mmol/L (ref 0.5–1.9)

## 2023-06-25 LAB — MRSA NEXT GEN BY PCR, NASAL: MRSA by PCR Next Gen: NOT DETECTED

## 2023-06-25 LAB — POCT I-STAT, CHEM 8
BUN: 41 mg/dL — ABNORMAL HIGH (ref 6–20)
Calcium, Ion: 1.13 mmol/L — ABNORMAL LOW (ref 1.15–1.40)
Chloride: 106 mmol/L (ref 98–111)
Creatinine, Ser: 2.3 mg/dL — ABNORMAL HIGH (ref 0.61–1.24)
Glucose, Bld: 301 mg/dL — ABNORMAL HIGH (ref 70–99)
HCT: 34 % — ABNORMAL LOW (ref 39.0–52.0)
Hemoglobin: 11.6 g/dL — ABNORMAL LOW (ref 13.0–17.0)
Potassium: 4.8 mmol/L (ref 3.5–5.1)
Sodium: 139 mmol/L (ref 135–145)
TCO2: 19 mmol/L — ABNORMAL LOW (ref 22–32)

## 2023-06-25 LAB — POCT ACTIVATED CLOTTING TIME
Activated Clotting Time: 256 s
Activated Clotting Time: 314 s
Activated Clotting Time: 279 s
Activated Clotting Time: 0 s
Activated Clotting Time: 147 s

## 2023-06-25 LAB — GLUCOSE, CAPILLARY
Glucose-Capillary: 280 mg/dL — ABNORMAL HIGH (ref 70–99)
Glucose-Capillary: 307 mg/dL — ABNORMAL HIGH (ref 70–99)

## 2023-06-25 LAB — HEPARIN LEVEL (UNFRACTIONATED): Heparin Unfractionated: <0.1 [IU]/mL — ABNORMAL LOW (ref 0.30–0.70)

## 2023-06-25 LAB — PROTIME-INR
INR: 1.1 (ref 0.8–1.2)
Prothrombin Time: 14.6 s (ref 11.4–15.2)

## 2023-06-25 LAB — CBG MONITORING, ED: Glucose-Capillary: 239 mg/dL — ABNORMAL HIGH (ref 70–99)

## 2023-06-25 SURGERY — CORONARY/GRAFT ACUTE MI REVASCULARIZATION
Anesthesia: Moderate Sedation

## 2023-06-25 MED ORDER — ALBUMIN HUMAN 5 % IV SOLN: 12.5000 g | Freq: Once | INTRAVENOUS | Status: AC

## 2023-06-25 MED ORDER — SODIUM BICARBONATE 8.4 % IV SOLN
INTRAVENOUS | Status: DC
  Filled 2023-06-25 (×3): qty 25

## 2023-06-25 MED ORDER — ATORVASTATIN CALCIUM 80 MG PO TABS
80.0000 mg | ORAL_TABLET | Freq: Every day | ORAL | Status: DC
  Administered 2023-06-25 – 2023-07-04 (×10): 80 mg via ORAL
  Filled 2023-06-25 (×10): qty 1

## 2023-06-25 MED ORDER — HEPARIN SODIUM (PORCINE) 1000 UNIT/ML IJ SOLN
INTRAMUSCULAR | Status: DC | PRN
  Administered 2023-06-25: 3000 [IU] via INTRAVENOUS
  Administered 2023-06-25: 2000 [IU] via INTRAVENOUS
  Administered 2023-06-25: 7000 [IU] via INTRAVENOUS

## 2023-06-25 MED ORDER — MILRINONE LACTATE IN DEXTROSE 20-5 MG/100ML-% IV SOLN
0.3750 ug/kg/min | INTRAVENOUS | Status: DC
  Administered 2023-06-25 – 2023-06-29 (×9): 0.25 ug/kg/min via INTRAVENOUS
  Administered 2023-06-30 – 2023-07-04 (×12): 0.375 ug/kg/min via INTRAVENOUS
  Filled 2023-06-25 (×22): qty 100

## 2023-06-25 MED ORDER — ACETAMINOPHEN 325 MG PO TABS
650.0000 mg | ORAL_TABLET | ORAL | Status: DC | PRN
  Administered 2023-06-25 – 2023-07-03 (×8): 650 mg via ORAL
  Filled 2023-06-25 (×8): qty 2

## 2023-06-25 MED ORDER — INSULIN ASPART 100 UNIT/ML IJ SOLN
0.0000 [IU] | Freq: Every day | INTRAMUSCULAR | Status: DC
  Administered 2023-06-25: 4 [IU] via SUBCUTANEOUS
  Administered 2023-06-26: 3 [IU] via SUBCUTANEOUS

## 2023-06-25 MED ORDER — FENTANYL CITRATE (PF) 100 MCG/2ML IJ SOLN
INTRAMUSCULAR | Status: DC | PRN
  Administered 2023-06-25: 25 ug via INTRAVENOUS

## 2023-06-25 MED ORDER — HEPARIN SODIUM (PORCINE) 1000 UNIT/ML IJ SOLN
INTRAMUSCULAR | Status: AC
Start: 2023-06-25 — End: ?
  Filled 2023-06-25: qty 10

## 2023-06-25 MED ORDER — SODIUM CHLORIDE 0.9 % IV SOLN
INTRAVENOUS | Status: AC | PRN
  Administered 2023-06-25: 250 mL via INTRAVENOUS

## 2023-06-25 MED ORDER — ASPIRIN 81 MG PO CHEW
324.0000 mg | CHEWABLE_TABLET | Freq: Once | ORAL | Status: AC
Start: 2023-06-25 — End: 2023-06-25
  Administered 2023-06-25: 324 mg via ORAL

## 2023-06-25 MED ORDER — HEPARIN (PORCINE) IN NACL 2000-0.9 UNIT/L-% IV SOLN
INTRAVENOUS | Status: DC | PRN
  Administered 2023-06-25: 1000 mL

## 2023-06-25 MED ORDER — NOREPINEPHRINE 4 MG/250ML-% IV SOLN
0.0000 ug/min | INTRAVENOUS | Status: DC
  Administered 2023-06-25: 2 ug/min via INTRAVENOUS
  Administered 2023-06-26: 3 ug/min via INTRAVENOUS
  Administered 2023-06-28: 1 ug/min via INTRAVENOUS
  Administered 2023-06-29 – 2023-07-02 (×3): 2 ug/min via INTRAVENOUS
  Filled 2023-06-25 (×8): qty 250

## 2023-06-25 MED ORDER — SODIUM CHLORIDE 0.9% FLUSH: 3.0000 mL | INTRAVENOUS | Status: DC | PRN

## 2023-06-25 MED ORDER — SODIUM CHLORIDE 0.9% FLUSH
3.0000 mL | Freq: Two times a day (BID) | INTRAVENOUS | Status: DC
  Administered 2023-06-25 – 2023-07-04 (×19): 3 mL via INTRAVENOUS

## 2023-06-25 MED ORDER — INSULIN ASPART 100 UNIT/ML IJ SOLN
0.0000 [IU] | Freq: Three times a day (TID) | INTRAMUSCULAR | Status: DC
  Administered 2023-06-25 – 2023-06-26 (×4): 5 [IU] via SUBCUTANEOUS

## 2023-06-25 MED ORDER — ONDANSETRON HCL 4 MG/2ML IJ SOLN: 4.0000 mg | Freq: Once | INTRAMUSCULAR | Status: AC

## 2023-06-25 MED ORDER — ONDANSETRON HCL 4 MG/2ML IJ SOLN
INTRAMUSCULAR | Status: AC
  Filled 2023-06-25: qty 2

## 2023-06-25 MED ORDER — HEPARIN (PORCINE) IN NACL 1000-0.9 UT/500ML-% IV SOLN
INTRAVENOUS | Status: AC
  Filled 2023-06-25: qty 1000

## 2023-06-25 MED ORDER — PERFLUTREN LIPID MICROSPHERE
1.0000 mL | INTRAVENOUS | Status: AC | PRN
  Administered 2023-06-25: 6 mL via INTRAVENOUS

## 2023-06-25 MED ORDER — ASPIRIN 81 MG PO TBEC
81.0000 mg | DELAYED_RELEASE_TABLET | Freq: Every day | ORAL | Status: DC
  Administered 2023-06-26 – 2023-07-04 (×9): 81 mg via ORAL
  Filled 2023-06-25 (×9): qty 1

## 2023-06-25 MED ORDER — NOREPINEPHRINE BITARTRATE 1 MG/ML IV SOLN
INTRAVENOUS | Status: AC | PRN
  Administered 2023-06-25: 5 ug/min via INTRAVENOUS

## 2023-06-25 MED ORDER — FENTANYL CITRATE (PF) 100 MCG/2ML IJ SOLN
INTRAMUSCULAR | Status: AC
  Filled 2023-06-25: qty 2

## 2023-06-25 MED ORDER — HEPARIN SODIUM (PORCINE) 1000 UNIT/ML IJ SOLN
INTRAMUSCULAR | Status: AC
  Filled 2023-06-25: qty 10

## 2023-06-25 MED ORDER — ONDANSETRON HCL 4 MG/2ML IJ SOLN
INTRAMUSCULAR | Status: DC | PRN
  Administered 2023-06-25: 4 mg via INTRAVENOUS

## 2023-06-25 MED ORDER — HEPARIN (PORCINE) IN NACL 1000-0.9 UT/500ML-% IV SOLN
INTRAVENOUS | Status: AC
  Filled 2023-06-25: qty 500

## 2023-06-25 MED ORDER — HEPARIN SODIUM (PORCINE) 5000 UNIT/ML IJ SOLN
4000.0000 [IU] | Freq: Once | INTRAMUSCULAR | Status: AC
  Administered 2023-06-25: 4000 [IU] via INTRAVENOUS

## 2023-06-25 MED ORDER — LIDOCAINE HCL (PF) 1 % IJ SOLN
INTRAMUSCULAR | Status: AC
  Administered 2023-06-25: 30 mL
  Filled 2023-06-25: qty 30

## 2023-06-25 MED ORDER — LIDOCAINE HCL 1 % IJ SOLN
INTRAMUSCULAR | Status: AC
  Filled 2023-06-25: qty 20

## 2023-06-25 MED ORDER — MIDAZOLAM HCL 2 MG/2ML IJ SOLN
INTRAMUSCULAR | Status: AC
Start: 2023-06-25 — End: ?
  Filled 2023-06-25: qty 2

## 2023-06-25 MED ORDER — NOREPINEPHRINE BITARTRATE 1 MG/ML IV SOLN
INTRAVENOUS | Status: AC
  Filled 2023-06-25: qty 4

## 2023-06-25 MED ORDER — ONDANSETRON HCL 4 MG/2ML IJ SOLN
INTRAMUSCULAR | Status: AC
  Administered 2023-06-25: 4 mg via INTRAVENOUS
  Filled 2023-06-25: qty 2

## 2023-06-25 MED ORDER — IOHEXOL 300 MG/ML  SOLN
INTRAMUSCULAR | Status: DC | PRN
  Administered 2023-06-25: 105 mL

## 2023-06-25 MED ORDER — SODIUM CHLORIDE 0.9 % IV SOLN: 250.0000 mL | INTRAVENOUS | Status: AC | PRN

## 2023-06-25 MED ORDER — ONDANSETRON HCL 4 MG/2ML IJ SOLN
4.0000 mg | Freq: Once | INTRAMUSCULAR | Status: AC
  Administered 2023-06-25: 4 mg via INTRAVENOUS

## 2023-06-25 MED ORDER — HEPARIN (PORCINE) 25000 UT/250ML-% IV SOLN
1800.0000 [IU]/h | INTRAVENOUS | Status: DC
  Administered 2023-06-25: 500 [IU]/h via INTRAVENOUS
  Administered 2023-06-27: 1100 [IU]/h via INTRAVENOUS
  Administered 2023-06-28: 1550 [IU]/h via INTRAVENOUS
  Administered 2023-06-28: 1700 [IU]/h via INTRAVENOUS
  Administered 2023-06-29 – 2023-07-02 (×8): 1800 [IU]/h via INTRAVENOUS
  Filled 2023-06-25 (×12): qty 250

## 2023-06-25 MED ORDER — SODIUM CHLORIDE 0.9% FLUSH: 3.0000 mL | Freq: Two times a day (BID) | INTRAVENOUS | Status: DC

## 2023-06-25 MED ORDER — MIDAZOLAM HCL 2 MG/2ML IJ SOLN
INTRAMUSCULAR | Status: DC | PRN
  Administered 2023-06-25: 1 mg via INTRAVENOUS

## 2023-06-25 MED ORDER — ORAL CARE MOUTH RINSE: 15.0000 mL | OROMUCOSAL | Status: DC | PRN

## 2023-06-25 MED ORDER — ALBUMIN HUMAN 5 % IV SOLN
INTRAVENOUS | Status: AC
  Administered 2023-06-25: 12.5 g via INTRAVENOUS
  Filled 2023-06-25: qty 250

## 2023-06-25 MED ORDER — LIDOCAINE HCL (PF) 1 % IJ SOLN
INTRAMUSCULAR | Status: DC | PRN
  Administered 2023-06-25 (×2): 30 mL

## 2023-06-25 SURGICAL SUPPLY — 27 items
BALLN MINITREK RX 2.0X15 (BALLOONS) ×1
BALLOON MINITREK RX 2.0X15 (BALLOONS) IMPLANT
CATH INFINITI 5FR ANG PIGTAIL (CATHETERS) IMPLANT
CATH INFINITI 5FR JL4 (CATHETERS) IMPLANT
CATH INFINITI JR4 5F (CATHETERS) IMPLANT
CATH LAUNCHER 6FR JR4 (CATHETERS) IMPLANT
CATH SWAN GANZ 7F STRAIGHT (CATHETERS) IMPLANT
CATH VISTA GUIDE 6FR XBLAD4 (CATHETERS) IMPLANT
DEVICE IMPELLA CP SMRT ASSIST (CATHETERS) IMPLANT
DRAPE BRACHIAL (DRAPES) IMPLANT
GLIDESHEATH SLEND SS 6F .021 (SHEATH) IMPLANT
GUIDEWIRE INQWIRE 1.5J.035X260 (WIRE) IMPLANT
INQWIRE 1.5J .035X260CM (WIRE)
KIT ENCORE 26 ADVANTAGE (KITS) IMPLANT
KIT MICROPUNCTURE NIT STIFF (SHEATH) IMPLANT
PACK CARDIAC CATH (CUSTOM PROCEDURE TRAY) ×1 IMPLANT
PROTECTION STATION PRESSURIZED (MISCELLANEOUS) ×1
SET ATX-X65L (MISCELLANEOUS) IMPLANT
SHEATH AVANTI 6FR X 11CM (SHEATH) IMPLANT
SHEATH AVANTI 7FRX11 (SHEATH) IMPLANT
STATION PROTECTION PRESSURIZED (MISCELLANEOUS) IMPLANT
SUT SILK 0 FSL (SUTURE) IMPLANT
TUBING CIL FLEX 10 FLL-RA (TUBING) IMPLANT
WIRE ASAHI FIELDER XT 300CM (WIRE) IMPLANT
WIRE EMERALD 3MM-J .035X150CM (WIRE) IMPLANT
WIRE RUNTHROUGH .014X180CM (WIRE) IMPLANT
WIRE RUNTHROUGH IZANAI 014 180 (WIRE) IMPLANT

## 2023-06-25 NOTE — TOC Initial Note (Signed)
Transition of Care [facility]) - Initial/Assessment Note    Patient Details  Name: Gabriel Shaw MRN: [id number] Date of Birth: 1971-02-22  Transition of [facility]) CM/SW Contact:    Elliot Cousin, RN Phone Number: [phone] 06/25/2023, 4:55 PM  Clinical Narrative:                 CM spoke to pt at bedside. States mother is currently living with him. They are having challenges paying bills as he has not received his disability. Pt currently is working with an attorney to get disability approved.  He has a cane at home.  States CSW at [facility] is helping him with getting bill paid for the month. Referral sent to HF CSW for possible assistance with bills.  Will continue to follow for dc needs.   Expected Discharge Plan: Home/Self Care Barriers to Discharge: Continued Medical Work up   Patient Goals and CMS Choice Patient states their goals for this hospitalization and ongoing recovery are:: wants to get better          Expected Discharge Plan and Services   Discharge Planning Services: CM Consult   Living arrangements for the past 2 months: Single Family Home                                      Prior Living Arrangements/Services Living arrangements for the past 2 months: Single Family Home Lives with:: Parents Patient language and need for interpreter reviewed:: Yes Do you feel safe going back to the place where you live?: Yes      Need for Family Participation in Patient Care: Yes (Comment) Care giver support system in place?: Yes (comment) Current home services: DME (cane) Criminal Activity/Legal Involvement Pertinent to Current Situation/Hospitalization: No - Comment as needed  Activities of Daily Living      Permission Sought/Granted Permission sought to share information with : Case Manager, Family Supports, PCP Permission granted to share information with : Yes, Verbal Permission Granted  Share Information with NAME: Shary Key     Permission  granted to share info w Relationship: mother  Permission granted to share info w Contact Information: [phone]  Emotional Assessment Appearance:: Appears stated age Attitude/Demeanor/Rapport: Engaged Affect (typically observed): Accepting Orientation: : Oriented to Place, Oriented to Self, Oriented to  Time, Oriented to Situation   Psych Involvement: No (comment)  Admission diagnosis:  Cardiogenic shock (HCC) [R57.0] Patient Active Problem List   Diagnosis Date Noted   ST elevation myocardial infarction (STEMI) (HCC) 06/25/2023   Cardiogenic shock (HCC) 06/25/2023   HLD (hyperlipidemia) 03/12/2023   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 [place] [address] Phone: [phone] Fax: [phone]  [facility] [address]), [place] - [address] [place]) [place] [address] Phone: [phone] Fax: [phone]     Social Drivers of Health (SDOH) Social History: SDOH Screenings   Food Insecurity: Food Insecurity Present (05/18/2023)  Housing: High Risk (05/18/2023)  Transportation Needs: No Transportation Needs (05/18/2023)  Recent Concern: Transportation Needs - Unmet  Transportation Needs (03/11/2023)  Depression (PHQ2-9): High Risk (03/12/2023)  Financial Resource Strain: High Risk (05/18/2023)  Physical Activity: Unknown (05/18/2023)  Social Connections: Socially Isolated (05/18/2023)  Stress: No Stress Concern Present (05/18/2023)  Recent Concern: Stress - Stress Concern Present (03/11/2023)  Tobacco Use: Medium Risk  (06/25/2023)   SDOH Interventions:     Readmission Risk Interventions     No data to display

## 2023-06-25 NOTE — Telephone Encounter (Signed)
Patient Product/process development scientist completed.    The patient is insured through [employer].     Ran test claim for Brilinta 90 mg and the current 30 day co-pay is $4.00.  Ran test claim for Entresto 24-26 mg and the current 30 day co-pay is $4.00.  Ran test claim for Farxiga 10 mg and Requires Prior Authorization  Ran test claim for Jardiance 10 mg and Requires Prior Authorization  This test claim was processed through [employer]- copay amounts may vary at other pharmacies due to [employer], or as the patient moves through the different stages of their insurance plan.     Roland Earl, CPHT Pharmacy Technician III Certified Patient Advocate [facility] Pharmacy Patient Advocate Team Direct Number: [phone]  Fax: [phone]

## 2023-06-25 NOTE — ED Triage Notes (Signed)
[facility] reports pt coming from home for n/v and constipation. Pt is diabetic and taking ozempic.

## 2023-06-25 NOTE — Inpatient Diabetes Management (Signed)
Inpatient Diabetes Program Recommendations  AACE/ADA: New Consensus Statement on Inpatient Glycemic Control (2015)  Target Ranges:  Prepandial:   less than 140 mg/dL      Peak postprandial:   less than 180 mg/dL (1-2 hours)      Critically ill patients:  140 - 180 mg/dL   Lab Results  Component Value Date   GLUCAP 239 (H) 06/25/2023   HGBA1C 7.0 (H) 06/11/2023    Review of Glycemic Control  Diabetes history: DM2 Outpatient Diabetes medications: Lantus 30 units daily, Novolog 10 units tid meal coverage, Metformin 1 gm bid, Ozempic 1 mg weekly Current orders for Inpatient glycemic control: Novolog 0-9 units tid, 0-5 units hs  Inpatient Diabetes Program Recommendations:   Please consider: -Semglee 15 units daily -Novolog 5 units tid meal coverage when eating 50% meals  Thank you, Darel Hong E. Miakoda Mcmillion, RN, MSN, CDCES  Diabetes Coordinator Inpatient Glycemic Control Team Team Pager [phone] (8am-5pm) 06/25/2023 2:50 PM

## 2023-06-25 NOTE — ED Notes (Signed)
Cardiology at bedside.

## 2023-06-25 NOTE — Progress Notes (Signed)
PHARMACY - ANTICOAGULATION CONSULT NOTE  Pharmacy Consult for heparin Indication:  Impella  No Known Allergies  Patient Measurements: Height: 6\' 3"  (190.5 cm) Weight: (S) 95 kg (209 lb 7 oz) IBW/kg (Calculated) : 84.5 Heparin Dosing Weight: 107 kg  Vital Signs: Temp: 100.6 F (38.1 C) (12/27 2130) Temp Source: Core (12/27 2000) BP: 102/78 (12/27 2130) Pulse Rate: 117 (12/27 2130)  Labs: Recent Labs    06/25/23 0822 06/25/23 1023 06/25/23 1206 06/25/23 1320 06/25/23 2201  HGB 12.4* 11.2*  11.9* 11.6* 11.3*  --   HCT 37.4* 33.0*  35.0* 34.0* 33.1*  --   PLT 348  --   --  336  --   APTT 31  --   --   --   --   LABPROT 14.6  --   --   --   --   INR 1.1  --   --   --   --   HEPARINUNFRC  --   --   --   --  <0.10*  CREATININE 2.42*  --  2.30*  --   --   TROPONINIHS >24,000*  --   --   --   --     Estimated Creatinine Clearance: 44.9 mL/min (A) (by C-G formula based on SCr of 2.3 mg/dL (H)).   Medical History: Past Medical History:  Diagnosis Date   Depression 1989   Diabetes mellitus without complication (HCC)    Gastroesophageal reflux disease 01/31/2014   HLD (hyperlipidemia) 03/12/2023   Hypertension    ST elevation myocardial infarction (STEMI) (HCC) 06/25/2023   Ulcer 2016      Assessment: 80 yoM admitted as late presenting STEMI s/p unsuccessful PCI and Impella CP placement. Pharmacy to manage heparin. Bicarbonate purge started. Will check ACT and once <160 will begin low dose systemic heparin. No AC PTA, CBC wnl.  Heparin level <0.1 is subtherapeutic on 500 units/hr. No titration unless level > 0.5 given high risk bleed per team.    Goal of Therapy:  Heparin level 0.2-0.5 units/ml Monitor platelets by anticoagulation protocol: Yes   Plan:  Continue Bicarbonate purge Continue Heparin 500 units/h Monitor daily heparin level, CBC, signs/symptoms of bleeding    Alphia Moh, PharmD, BCPS, BCCP Clinical Pharmacist  Please check AMION for all [facility]  Pharmacy phone numbers After 10:00 PM, call Main Pharmacy [phone]

## 2023-06-25 NOTE — Consult Note (Signed)
Cardiology Consultation   Patient ID: DELAYNE PETRA MRN: [id number]; DOB: 1970-09-08  Admit date: 06/25/2023 Date of Consult: 06/25/2023  PCP:  Jackolyn Confer, MD   [facility] Providers Cardiologist:  None        Patient Profile:   Gabriel Shaw is a 52 y.o. male with a hx of diabetes mellitus, essential hypertension and obesity who is being seen 06/25/2023 for the evaluation of anterior/inferior STEMI at the request of Dr. Arnoldo Morale.  History of Present Illness:   Gabriel Shaw is a 52 year old male with no prior cardiac history.  He started having chest pain on Wednesday which was severe and associated with nausea and vomiting.  He did not seek medical attention.  His symptoms continued to wax and wane but worsened today.  The pain became worse with deep inspiration with ongoing nausea and vomiting.  He arrived to the ED and his pain was rated as 9 out of 10 with associated shortness of breath.  There is no history of tobacco or alcohol use.  He is on Ozempic. His EKG showed minor ST elevation starting in V4-V6 as well as the inferior leads with inferior Q waves.  It was felt that the patient is a late presenting STEMI but given continued symptoms of chest pain and persistent ST elevation, I recommended proceeding with emergent cardiac catheterization.  The patient was hesitant to proceed and wanted to consult with his mother before agreeing.  I did speak with his mother on the phone before bring him to the Cath Lab.   Past Medical History:  Diagnosis Date   Depression 1989   Diabetes mellitus without complication (HCC)    Gastroesophageal reflux disease 01/31/2014   HLD (hyperlipidemia) 03/12/2023   Hypertension    Ulcer 2016    Past Surgical History:  Procedure Laterality Date   EYE SURGERY  03/02/2023     Home Medications:  Prior to Admission medications   Medication Sig Start Date End Date Taking? Authorizing Provider  Accu-Chek Softclix Lancets lancets Use to check  blood glucose daily 06/16/23   Jackolyn Confer, MD  aspirin 81 MG chewable tablet Chew 81 mg by mouth daily. 09/23/22   [provider]  atorvastatin (LIPITOR) 40 MG tablet Take 1 tablet (40 mg total) by mouth daily. 03/18/23 03/17/24  Jackolyn Confer, MD  Blood Glucose Monitoring Suppl (ACCU-CHEK AVIVA PLUS) w/Device KIT Use to check blood glucose daily 06/16/23   Jackolyn Confer, MD  Continuous Glucose Receiver (DEXCOM G6 RECEIVER) DEVI Use to monitor blood glucose 04/06/23   Jackolyn Confer, MD  Continuous Glucose Sensor (DEXCOM G6 SENSOR) MISC Change sensor every 10 days 05/18/23   Jackolyn Confer, MD  Continuous Glucose Transmitter (DEXCOM G6 TRANSMITTER) MISC Change transmitter every 90 days 04/06/23   Jackolyn Confer, MD  Glucagon 1 MG/0.2ML SOLN Inject 1 mg subcutaneously for severe hypoglycemia; if no response is observed within 15 minutes, an additional 1 mg dose may be administered.  Call 911. 06/16/23   Jackolyn Confer, MD  glucose blood (ACCU-CHEK AVIVA PLUS) test strip Use to check blood glucose daily 06/16/23   Jackolyn Confer, MD  insulin aspart (NOVOLOG) 100 unit/mL injection Inject 10 Units into the skin 3 (three) times daily before meals.    [provider]  LANTUS SOLOSTAR 100 UNIT/ML Solostar Pen Inject 30 Units into the skin daily. 10/20/22   [provider]  metFORMIN (GLUCOPHAGE) 500 MG tablet Take 2 tablets (  1,000 mg total) by mouth 2 (two) times daily with a meal. 04/02/23 04/01/24  Jackolyn Confer, MD  OZEMPIC, 1 MG/DOSE, 4 MG/3ML SOPN INJECT 1 MG UNDER THE SKIN ONCE WEEKLY AS DIRECTED 06/17/23   Jackolyn Confer, MD  TRUEPLUS 5-BEVEL PEN NEEDLES 32G X 4 MM MISC  12/29/22   [provider]    Inpatient Medications: Scheduled Meds:  [MAR Hold] sodium chloride flush  3-10 mL Intravenous Q12H   Continuous Infusions:  sodium chloride 250 mL (06/25/23 1027)   norepinephrine (LEVOPHED) 4 mg in dextrose 5 % 250 mL (0.016 mg/mL) infusion 2 mcg/min  (06/25/23 0957)   PRN Meds: sodium chloride, fentaNYL, Heparin (Porcine) in NaCl, heparin sodium (porcine), iohexol, lidocaine (PF), midazolam, norepinephrine (LEVOPHED) 4 mg in dextrose 5 % 250 mL (0.016 mg/mL) infusion, ondansetron, [MAR Hold] sodium chloride flush  Allergies:   No Known Allergies  Social History:   Social History   Socioeconomic History   Marital status: Single    Spouse name: Not on file   Number of children: Not on file   Years of education: Not on file   Highest education level: Some college, no degree  Occupational History   Not on file  Tobacco Use   Smoking status: Former    Current packs/day: 0.00    Average packs/day: 0.5 packs/day for 0.5 years (0.3 ttl pk-yrs)    Types: Cigarettes, Pipe, Cigars, E-cigarettes    Quit date: 03/13/2017    Years since quitting: 6.2   Smokeless tobacco: Never  Vaping Use   Vaping status: Former  Substance and Sexual Activity   Alcohol use: Not Currently    Comment: Use to drink a few bottles a week in the past   Drug use: Not Currently    Types: Cocaine, LSD, Marijuana   Sexual activity: Not Currently    Birth control/protection: Abstinence, Condom  Other Topics Concern   Not on file  Social History Narrative   Not on file   Social Drivers of Health   Financial Resource Strain: High Risk (05/18/2023)   Overall Financial Resource Strain (CARDIA)    Difficulty of Paying Living Expenses: Very hard  Food Insecurity: Food Insecurity Present (05/18/2023)   Hunger Vital Sign    Worried About Running Out of Food in the Last Year: Sometimes true    Ran Out of Food in the Last Year: Sometimes true  Transportation Needs: No Transportation Needs (05/18/2023)   PRAPARE - Administrator, Civil Service (Medical): No    Lack of Transportation (Non-Medical): No  Recent Concern: Transportation Needs - Unmet Transportation Needs (03/11/2023)   PRAPARE - Transportation    Lack of Transportation (Medical): Yes     Lack of Transportation (Non-Medical): Yes  Physical Activity: Unknown (05/18/2023)   Exercise Vital Sign    Days of Exercise per Week: 0 days    Minutes of Exercise per Session: Not on file  Stress: No Stress Concern Present (05/18/2023)   [employer] of Occupational Health - Occupational Stress Questionnaire    Feeling of Stress : Not at all  Recent Concern: Stress - Stress Concern Present (03/11/2023)   [employer] of Occupational Health - Occupational Stress Questionnaire    Feeling of Stress : Very much  Social Connections: Socially Isolated (05/18/2023)   Social Connection and Isolation Panel [NHANES]    Frequency of Communication with Friends and Family: Never    Frequency of Social Gatherings with Friends and Family: Never  Attends Religious Services: Never    Active Member of Clubs or Organizations: No    Attends Engineer, structural: Not on file    Marital Status: Never married  Catering manager Violence: Not on file    Family History:    Family History  Problem Relation Age of Onset   Diabetes Mother    Vision loss Mother    Cancer Maternal Uncle    Cancer Maternal Grandmother    Diabetes Maternal Grandmother      ROS:  Please see the history of present illness.   All other ROS reviewed and negative.     Physical Exam/Data:   Vitals:   06/25/23 0818 06/25/23 0835 06/25/23 0840 06/25/23 1047  BP: 131/84 115/68 112/85 (!) 122/102  Pulse: (!) 110 (!) 115 (!) 114 (!) 110  Resp: 18 18 18 18   Temp: 98.3 F (36.8 C)   98.3 F (36.8 C)  TempSrc: Oral     SpO2: 92%   100%   No intake or output data in the 24 hours ending 06/25/23 1108    05/18/2023    9:32 AM 03/12/2023    8:37 AM 01/31/2023    4:09 AM  Last 3 Weights  Weight (lbs) 236 lb 6.4 oz 242 lb 230 lb  Weight (kg) 107.23 kg 109.77 kg 104.327 kg     There is no height or weight on file to calculate BMI.  General:  Well nourished, well developed, in mild distress due to  pain HEENT: normal Neck: no JVD Vascular: No carotid bruits; Distal pulses 2+ bilaterally Cardiac:  normal S1, S2; RRR; no murmur  Lungs:  clear to auscultation bilaterally, no wheezing, rhonchi or rales  Abd: soft, nontender, no hepatomegaly  Ext: no edema Musculoskeletal:  No deformities, BUE and BLE strength normal and equal Skin: warm and dry  Neuro:  CNs 2-12 intact, no focal abnormalities noted Psych:  Normal affect  Radial pulses are not palpable.  Femoral pulses palpable.  EKG:  The EKG was personally reviewed and demonstrates: Sinus tachycardia with 1 mm to lateral ST elevation as well as in the inferior leads with inferior Q waves Telemetry:  Telemetry was personally reviewed and demonstrates:    Relevant CV Studies:   Laboratory Data:  High Sensitivity Troponin:   Recent Labs  Lab 06/25/23 0822  TROPONINIHS >24,000*     Chemistry Recent Labs  Lab 06/25/23 0822 06/25/23 1023  NA 139 139  K 4.3 4.4  CL 106  --   CO2 18*  --   GLUCOSE 256*  --   BUN 39*  --   CREATININE 2.42*  --   CALCIUM 8.7*  --   GFRNONAA 31*  --   ANIONGAP 15  --     Recent Labs  Lab 06/25/23 0822  PROT 7.2  ALBUMIN 3.6  AST 328*  ALT 67*  ALKPHOS 60  BILITOT 1.3*   Lipids  Recent Labs  Lab 06/25/23 0822  CHOL 120  TRIG 78  HDL 52  LDLCALC 52  CHOLHDL 2.3    Hematology Recent Labs  Lab 06/25/23 0822 06/25/23 1023  WBC 19.5*  --   RBC 4.29  --   HGB 12.4* 11.9*  HCT 37.4* 35.0*  MCV 87.2  --   MCH 28.9  --   MCHC 33.2  --   RDW 13.4  --   PLT 348  --    Thyroid No results for input(s): "TSH", "FREET4" in the last 168  hours.  BNPNo results for input(s): "BNP", "PROBNP" in the last 168 hours.  DDimer No results for input(s): "DDIMER" in the last 168 hours.   Radiology/Studies:  No results found.   Assessment and Plan:   Late presenting anterolateral/inferior ST elevation myocardial infarction with ongoing chest pain.  Symptoms started on Wednesday.  Q  waves are already present.  Still having chest pain.  Suspect wraparound LAD.  Commended proceeding with emergent GI.  I discussed the procedure in details as well as risk and benefits.  Radial pulses could not be palpated and thus we will plan on femoral access.  The patient's blood pressure was borderline low and he was tachycardic.  Further recommendations to follow-up with physician.   Risk Assessment/Risk Scores:     TIMI Risk Score for ST  Elevation MI:   The patient's TIMI risk score is 10, which indicates a 35.9% risk of all cause mortality at 30 days.{   For questions or updates, please contact [facility] Please consult [email or web address] for contact info under    Signed, Lorine Bears, MD  06/25/2023 11:08 AM

## 2023-06-25 NOTE — H&P (Addendum)
Advanced Heart Failure Team History and Physical Note   PCP:  Jackolyn Confer, MD  PCP-Cardiology: Dr. Kirke Corin (new)     Reason for Admission: Acute MI Cardiogenic Shock    HPI:    52 y/o AAM w/ Type 2DM, HTN and obesity, who presented to [facility] earlier today as late presenting STEMI.   Developed acute CP 2 days ago, waxed and waned over the last 2 days, worsened earlier this morning prompting ED evaluation. EKG showed minor ST elevation starting in V4-V6 as well as the inferior leads with inferior Q waves. Emergent LHC w/ multivessel disease, w/ heavily calcified coronaries, 70% mLAD lesions, 100% occluded mid-distal LAD (culprit), 90% prox RCA, 90% prox-mid RCA and 70% LPAV lesion. LVEF severely reduced ~25% w/ mid-distal anterior and apical AK. RHC w/ normal RA pressure, mild pulmonary hypertension and moderately reduced cardiac output, CI 1.93. Impella CP placed and started on NE.   Unfortunately failed PCI attempt of the LAD due to inability to cross the occlusion in spite of using 3 different wires. CAD being treated medically for now. Transferred to [facility] for further management of cardiogenic shock and AHF team consultation.   He is A&O on arrival. On NRB. Impella CP @ P-8, Flow 3.4L. On NE 3.    Review of Systems: [y] = yes, [ ]  = no   General: Weight gain [ ] ; Weight loss [ ] ; Anorexia [ ] ; Fatigue [ ] ; Fever [ ] ; Chills [ ] ; Weakness [ ]   Cardiac: Chest pain/pressure [ Y]; Resting SOB [ Y]; Exertional SOB [ ] ; Orthopnea [ ] ; Pedal Edema [ ] ; Palpitations [ ] ; Syncope [ ] ; Presyncope [ ] ; Paroxysmal nocturnal dyspnea[ ]   Pulmonary: Cough [ ] ; Wheezing[ ] ; Hemoptysis[ ] ; Sputum [ ] ; Snoring [ ]   GI: Vomiting[ ] ; Dysphagia[ ] ; Melena[ ] ; Hematochezia [ ] ; Heartburn[ ] ; Abdominal pain [ ] ; Constipation [ ] ; Diarrhea [ ] ; BRBPR [ ]   GU: Hematuria[ ] ; Dysuria [ ] ; Nocturia[ ]   Vascular: Pain in legs with walking [ ] ; Pain in feet with lying flat [ ] ; Non-healing sores [ ] ; Stroke [ ] ;  TIA [ ] ; Slurred speech [ ] ;  Neuro: Headaches[ ] ; Vertigo[ ] ; Seizures[ ] ; Paresthesias[ ] ;Blurred vision [ ] ; Diplopia [ ] ; Vision changes [ ]   Ortho/Skin: Arthritis [ ] ; Joint pain [ ] ; Muscle pain [ ] ; Joint swelling [ ] ; Back Pain [ ] ; Rash [ ]   Psych: Depression[ ] ; Anxiety[ ]   Heme: Bleeding problems [ ] ; Clotting disorders [ ] ; Anemia [ ]   Endocrine: Diabetes [ Y]; Thyroid dysfunction[ ]    Home Medications Prior to Admission medications   Medication Sig Start Date End Date Taking? Authorizing Provider  Accu-Chek Softclix Lancets lancets Use to check blood glucose daily 06/16/23   Jackolyn Confer, MD  aspirin 81 MG chewable tablet Chew 81 mg by mouth daily. 09/23/22   [provider]  atorvastatin (LIPITOR) 40 MG tablet Take 1 tablet (40 mg total) by mouth daily. 03/18/23 03/17/24  Jackolyn Confer, MD  Blood Glucose Monitoring Suppl (ACCU-CHEK AVIVA PLUS) w/Device KIT Use to check blood glucose daily 06/16/23   Jackolyn Confer, MD  Continuous Glucose Receiver (DEXCOM G6 RECEIVER) DEVI Use to monitor blood glucose 04/06/23   Jackolyn Confer, MD  Continuous Glucose Sensor (DEXCOM G6 SENSOR) MISC Change sensor every 10 days 05/18/23   Jackolyn Confer, MD  Continuous Glucose Transmitter (DEXCOM G6 TRANSMITTER) MISC Change transmitter every 90 days 04/06/23  Jackolyn Confer, MD  Glucagon 1 MG/0.2ML SOLN Inject 1 mg subcutaneously for severe hypoglycemia; if no response is observed within 15 minutes, an additional 1 mg dose may be administered.  Call 911. 06/16/23   Jackolyn Confer, MD  glucose blood (ACCU-CHEK AVIVA PLUS) test strip Use to check blood glucose daily 06/16/23   Jackolyn Confer, MD  insulin aspart (NOVOLOG) 100 unit/mL injection Inject 10 Units into the skin 3 (three) times daily before meals.    [provider]  LANTUS SOLOSTAR 100 UNIT/ML Solostar Pen Inject 30 Units into the skin daily. 10/20/22   [provider]  metFORMIN (GLUCOPHAGE) 500 MG tablet  Take 2 tablets (1,000 mg total) by mouth 2 (two) times daily with a meal. 04/02/23 04/01/24  Jackolyn Confer, MD  OZEMPIC, 1 MG/DOSE, 4 MG/3ML SOPN INJECT 1 MG UNDER THE SKIN ONCE WEEKLY AS DIRECTED 06/17/23   Jackolyn Confer, MD  TRUEPLUS 5-BEVEL PEN NEEDLES 32G X 4 MM MISC  12/29/22   [provider]    Past Medical History: Past Medical History:  Diagnosis Date   Depression 1989   Diabetes mellitus without complication (HCC)    Gastroesophageal reflux disease 01/31/2014   HLD (hyperlipidemia) 03/12/2023   Hypertension    ST elevation myocardial infarction (STEMI) (HCC) 06/25/2023   Ulcer 2016    Past Surgical History: Past Surgical History:  Procedure Laterality Date   EYE SURGERY  03/02/2023    Family History:  Family History  Problem Relation Age of Onset   Diabetes Mother    Vision loss Mother    Cancer Maternal Uncle    Cancer Maternal Grandmother    Diabetes Maternal Grandmother     Social History: Social History   Socioeconomic History   Marital status: Single    Spouse name: Not on file   Number of children: Not on file   Years of education: Not on file   Highest education level: Some college, no degree  Occupational History   Not on file  Tobacco Use   Smoking status: Former    Current packs/day: 0.00    Average packs/day: 0.5 packs/day for 0.5 years (0.3 ttl pk-yrs)    Types: Cigarettes, Pipe, Cigars, E-cigarettes    Quit date: 03/13/2017    Years since quitting: 6.2   Smokeless tobacco: Never  Vaping Use   Vaping status: Former  Substance and Sexual Activity   Alcohol use: Not Currently    Comment: Use to drink a few bottles a week in the past   Drug use: Not Currently    Types: Cocaine, LSD, Marijuana   Sexual activity: Not Currently    Birth control/protection: Abstinence, Condom  Other Topics Concern   Not on file  Social History Narrative   Not on file   Social Drivers of Health   Financial Resource Strain: High Risk (05/18/2023)    Overall Financial Resource Strain (CARDIA)    Difficulty of Paying Living Expenses: Very hard  Food Insecurity: Food Insecurity Present (05/18/2023)   Hunger Vital Sign    Worried About Running Out of Food in the Last Year: Sometimes true    Ran Out of Food in the Last Year: Sometimes true  Transportation Needs: No Transportation Needs (05/18/2023)   PRAPARE - Administrator, Civil Service (Medical): No    Lack of Transportation (Non-Medical): No  Recent Concern: Transportation Needs - Unmet Transportation Needs (03/11/2023)   PRAPARE - Transportation    Lack of  Transportation (Medical): Yes    Lack of Transportation (Non-Medical): Yes  Physical Activity: Unknown (05/18/2023)   Exercise Vital Sign    Days of Exercise per Week: 0 days    Minutes of Exercise per Session: Not on file  Stress: No Stress Concern Present (05/18/2023)   [employer] of Occupational Health - Occupational Stress Questionnaire    Feeling of Stress : Not at all  Recent Concern: Stress - Stress Concern Present (03/11/2023)   [employer] of Occupational Health - Occupational Stress Questionnaire    Feeling of Stress : Very much  Social Connections: Socially Isolated (05/18/2023)   Social Connection and Isolation Panel [NHANES]    Frequency of Communication with Friends and Family: Never    Frequency of Social Gatherings with Friends and Family: Never    Attends Religious Services: Never    Database administrator or Organizations: No    Attends Engineer, structural: Not on file    Marital Status: Never married    Allergies:  No Known Allergies  Objective:    Vital Signs:   Temp:  [98.3 F (36.8 C)] 98.3 F (36.8 C) (12/27 1047) Pulse Rate:  [0-128] 111 (12/27 1055) Resp:  [11-29] 21 (12/27 1055) BP: (80-158)/(48-138) 127/96 (12/27 1055) SpO2:  [92 %-100 %] 98 % (12/27 1055)   There were no vitals filed for this visit.   Physical Exam     General:  ill appearing on  NRB HEENT: Normal Neck: Supple. + RIJ Swan. Carotids 2+ bilat; no bruits. No lymphadenopathy or thyromegaly appreciated. Cor: PMI nondisplaced. Regular rate & rhythm. No rubs, gallops or murmurs. Lungs: decreased BS at the bases bilaterally  Abdomen: Soft, nontender, nondistended. No hepatosplenomegaly. No bruits or masses. Good bowel sounds. Extremities: No cyanosis, clubbing, rash, edema R fem Impella (oozing at site) Neuro: Alert & oriented x 3, cranial nerves grossly intact. moves all 4 extremities w/o difficulty. Affect pleasant.   Telemetry   Sinus tach, 107 bpm   EKG   Initial EKG minor ST elevation V4-V6 + inferior leads, inferior Q waves  Labs     Basic Metabolic Panel: Recent Labs  Lab 06/25/23 0822 06/25/23 1023  NA 139 139  K 4.3 4.4  CL 106  --   CO2 18*  --   GLUCOSE 256*  --   BUN 39*  --   CREATININE 2.42*  --   CALCIUM 8.7*  --     Liver Function Tests: Recent Labs  Lab 06/25/23 0822  AST 328*  ALT 67*  ALKPHOS 60  BILITOT 1.3*  PROT 7.2  ALBUMIN 3.6   No results for input(s): "LIPASE", "AMYLASE" in the last 168 hours. No results for input(s): "AMMONIA" in the last 168 hours.  CBC: Recent Labs  Lab 06/25/23 0822 06/25/23 1023  WBC 19.5*  --   NEUTROABS 16.0*  --   HGB 12.4* 11.9*  HCT 37.4* 35.0*  MCV 87.2  --   PLT 348  --     Cardiac Enzymes: No results for input(s): "CKTOTAL", "CKMB", "CKMBINDEX", "TROPONINI" in the last 168 hours.  BNP: BNP (last 3 results) No results for input(s): "BNP" in the last 8760 hours.  ProBNP (last 3 results) No results for input(s): "PROBNP" in the last 8760 hours.   CBG: Recent Labs  Lab 06/25/23 0819  GLUCAP 239*    Coagulation Studies: Recent Labs    06/25/23 0822  LABPROT 14.6  INR 1.1    Imaging: CARDIAC CATHETERIZATION Result  Date: 06/25/2023   LPAV lesion is 70% stenosed.   Mid LAD to Dist LAD lesion is 100% stenosed.   Mid LAD lesion is 70% stenosed.   Prox RCA lesion  is 90% stenosed.   Prox RCA to Mid RCA lesion is 90% stenosed.   There is severe left ventricular systolic dysfunction.   LV end diastolic pressure is moderately elevated.   The left ventricular ejection fraction is less than 25% by visual estimate. 1.  Heavily calcified coronary arteries especially the LAD with occluded mid to distal LAD which is the likely culprit for anterior/inferior STEMI (wraparound LAD) with late presentation.  In addition, there is 70% stenosis in the distal left circumflex and diffuse disease in a small nondominant right coronary artery.  The coronary arteries are diffusely diseased overall. 2.  Severely reduced LV systolic function with an EF less than 20% with mid to distal anterior and apical akinesis. 3.  Attempted unsuccessful PCI of the LAD due to inability to cross the occlusion in spite of using 3 different wires.  Suspect acute on chronic occlusion with onset being 2 days ago. 4.  Successful Impella CP mechanical support device placement via the right common femoral artery due to cardiogenic shock. 5.  Right heart catheterization at the end showed normal RA pressure, mild pulmonary hypertension and moderately reduced cardiac output. Recommendations: Transfer to [facility] for further support. No percutaneous or surgical revascularization option to the LAD in that area is likely nonviable at this point.  I do not think the patient benefits from PCI to the distal left circumflex.  Will treat medically for coronary artery disease at this time. I discussed the case with Dr. Lynnette Caffey and Dr. Gala Romney.    Patient Profile   52 y/o AAM w/ Type 2DM, HTN and obesity admitted w/ late presenting anterior/inferior STEMI c/b cardiogenic shock. CAD not amendable to PCI.   Assessment/Plan   1. Late Presenting Anterior/Inferior STEMI/CAD - 70% mid LAD, 100% m-dLAD, 90% pRCA, 90% mRCA, 70% LPAV - dz not amendable to PCI, failed attempt to cross LAD occlusion x3 - medical  management  - ASA + statin   - no ? blocker w/ shock   2. Acute MI CS/ Acute Systolic Heart Failure  - EF on cath LVG ~20% - Ischemic CM, suspect nonviable myocardium   - RHC c/w LV dominant shock (mRA 6, PAP 46/14/m30, PCW 20, PAPi 5.3, PA sat 53%, FICK CO/CI 4.41/1.93)  - on NE 3 + Impella CP @ P-8, Flow 3.4 L. SVR 1600 - start milrinone 0.25  - follow swan hemodynamics  - obtain complete echo  - GDMT limited by AKI  - worry about trajectory. Will need to be followed for advanced therapies, may need eventual LVAD  - hold heparin for now w/ bleeding at Impella site. Sodium Bicarb Impella Purge   3. AKI - SCr 1.3 on admit - 2.42 today - 2/2 CS/hypotension  - support CO w/ Impella + NE, Maintain MAP ~70  - follow UOP/ BMP   4. Elevated LFTs - AST 328 - ALT 67 - 2/2 CS - follow trends   5. Leukocytosis  - WBC 19.5K  - suspect reactive, follow closely   6. Type 2DM  - on SSI - Hgb 7.0   7. Jehovah's Witness - Dr. Gala Romney discussed with patient (and his mother by phone) - he would NOT accept any blood products even in a life and death situation.   Robbie Lis, PA-C 06/25/2023,  11:41 AM  Advanced Heart Failure Team Pager [phone] (M-F; 7a - 5p)  Please contact [facility] for night-coverage after hours (4p -7a ) and weekends on [email or web address]  Agree with above  24 y/o diabetic (A1c 7.0) with HTN admitted to [facility] with late presenting anterior MI . Hstrop > 24k.   Taken to cath lab. LAD totally occluded midsection. RCA 100% Moderate disease in LCx, EF 20%. Unable to open LAD.   Developed shock in cath lab. Impella placed with good response.   In transit developed severe groin bleed in the ambulance despite manual pressure.   I met ambulance in the ER bay. Impella sheath had been pulled back slight with profuse ongoing bleeding..I advanced sheath and held pressure. Patient transported to [facility]. Once in 2H I resuured sheath with good hemostasis.   Stat CBC done   Hgb 11. Filling pressures low on swan with SVR 1900. Given albumin and started on milrinone  Impella repositioned under echo guidance.   Denies CP or SOB.   General:  Lying flat in bed.No resp difficulty HEENT: normal Neck: supple. RIJ swan Carotids 2+ bilat; no bruits. No lymphadenopathy or thryomegaly appreciated. Cor: REg tachy  No rubs, gallops or murmurs. Lungs: clear Abdomen: soft, nontender, nondistended. No hepatosplenomegaly. No bruits or masses. Good bowel sounds. Extremities: no cyanosis, clubbing, rash, edema RFA Impella Neuro: alert & orientedx3, cranial nerves grossly intact. moves all 4 extremities w/o difficulty. Affect pleasant   He has cardiogenic shock in setting of delayed presentation of anterior MI. Unable to open the LAD in cath lab. Impella placed.   Hemodynamics assessed. Given albumin and milrinone started.   Scr up 1.13 -> 2.3. Need to follow.   Will start GDMT. Hopefully can wean Impella over next few days but may have to consider advanced options.   Addendum: Patient is Jehovah's Witness and would NOT accept blood products even in life/death situation. Confirmed with him and his mother personally.   Total CCT 120 mins.   Arvilla Meres, MD  2:46 PM

## 2023-06-25 NOTE — Progress Notes (Signed)
PHARMACY - ANTICOAGULATION CONSULT NOTE  Pharmacy Consult for heparin Indication:  Impella  No Known Allergies  Patient Measurements:   Heparin Dosing Weight: 107 kg  Vital Signs: Temp: 99.5 F (37.5 C) (12/27 1230) Temp Source: Core (12/27 1153) BP: 101/63 (12/27 1230) Pulse Rate: 107 (12/27 1230)  Labs: Recent Labs    06/25/23 0822 06/25/23 1023 06/25/23 1206  HGB 12.4* 11.9* 11.6*  HCT 37.4* 35.0* 34.0*  PLT 348  --   --   APTT 31  --   --   LABPROT 14.6  --   --   INR 1.1  --   --   CREATININE 2.42*  --  2.30*  TROPONINIHS >24,000*  --   --     CrCl cannot be calculated (Unknown ideal weight.).   Medical History: Past Medical History:  Diagnosis Date   Depression 1989   Diabetes mellitus without complication (HCC)    Gastroesophageal reflux disease 01/31/2014   HLD (hyperlipidemia) 03/12/2023   Hypertension    ST elevation myocardial infarction (STEMI) (HCC) 06/25/2023   Ulcer 2016      Assessment: 52 yoM admitted as late presenting STEMI s/p unsuccessful PCI and Impella CP placement. Pharmacy to manage heparin. Bicarbonate purge started. Will check ACT and once <160 will begin low dose systemic heparin. No AC PTA, CBC wnl.  ACT 147, repeat H/H stable, oozing at Impella site stable. Discussed with Dr. Venetia Night and will start low dose heparin overnight and titrate further in am if no bleeding.  Goal of Therapy:  Heparin level 0.2-0.5 units/ml Monitor platelets by anticoagulation protocol: Yes   Plan:  Bicarbonate purge Heparin 500 units/h Check heparin level in 8h to ensure not high   Fredonia Highland, PharmD, BCPS, [facility] Clinical Pharmacist [phone] Please check AMION for all [facility] Pharm[facility] numbers 06/25/2023

## 2023-06-25 NOTE — Plan of Care (Signed)

## 2023-06-25 NOTE — Progress Notes (Signed)
Chaplain provides compassionate presence before pt is taken to cath lab.

## 2023-06-25 NOTE — ED Provider Notes (Signed)
[facility] Provider Note    Event Date/Time   First MD Initiated Contact with Patient 06/25/23 [phone]     (approximate)   History   Emesis   HPI  Gabriel Shaw is a 52 y.o. male past medical history significant for hypertension, obesity, diabetes, who presents to the emergency department with chest pain and vomiting.  States that on Wednesday night started having severe chest pain associated with nausea and vomiting.  States that it is constant sharp pain to the left side of his chest.  Does have worsening with deep inspiration.  Ongoing episodes of nausea and vomiting.  Currently rates his pain as 9/10.  Mild shortness of breath.  Denies any tobacco use or alcohol use.  Currently taking Ozempic.  Denies any diarrhea or abdominal pain.  No prior history of coronary artery disease, no prior stress testing or cardiac catheterization.     Physical Exam   Triage Vital Signs: ED Triage Vitals [06/25/23 0818]  Encounter Vitals Group     BP 131/84     Systolic BP Percentile      Diastolic BP Percentile      Pulse Rate (!) 110     Resp 18     Temp 98.3 F (36.8 C)     Temp Source Oral     SpO2 92 %     Weight      Height      Head Circumference      Peak Flow      Pain Score      Pain Loc      Pain Education      Exclude from Growth Chart     Most recent vital signs: Vitals:   06/25/23 0818  BP: 131/84  Pulse: (!) 110  Resp: 18  Temp: 98.3 F (36.8 C)  SpO2: 92%    Physical Exam Constitutional:      Appearance: He is well-developed.     Comments: Actively vomiting  HENT:     Head: Atraumatic.  Eyes:     Conjunctiva/sclera: Conjunctivae normal.  Cardiovascular:     Rate and Rhythm: Regular rhythm. Tachycardia present.     Comments: Faint pulses to bilateral radial arteries.  Faint pulses to bilateral DP Pulmonary:     Effort: No respiratory distress.  Abdominal:     Tenderness: There is abdominal tenderness (Mild epigastric abdominal  tenderness.  No rebound or guarding.).  Musculoskeletal:     Cervical back: Normal range of motion.     Right lower leg: No edema.     Left lower leg: No edema.  Skin:    General: Skin is warm.  Neurological:     Mental Status: He is alert. Mental status is at baseline.     IMPRESSION / MDM / ASSESSMENT AND PLAN / ED COURSE  I reviewed the triage vital signs and the nursing notes.  On arrival patient was tachycardic with chest pain.  EKG concerning for STEMI  EKG  I, Corena Herter, the attending physician, personally viewed and interpreted this ECG.  Given EKG from triage.  EKG concerning for an acute STEMI with ST elevation to the inferior and lateral leads with reciprocal change.  This is new when compared to prior EKG.  Does have tachycardia.  Repeat EKG obtained and continued to have concerning features of ST elevation in the inferior leads and lateral leads.  Sinus tachycardia while on cardiac telemetry.  LABS (all labs ordered are listed,  but only abnormal results are displayed) Labs interpreted as -    Labs Reviewed  CBC WITH DIFFERENTIAL/PLATELET - Abnormal; Notable for the following components:      Result Value   WBC 19.5 (*)    Hemoglobin 12.4 (*)    HCT 37.4 (*)    Neutro Abs 16.0 (*)    Monocytes Absolute 1.7 (*)    All other components within normal limits  CBG MONITORING, ED - Abnormal; Notable for the following components:   Glucose-Capillary 239 (*)    All other components within normal limits  PROTIME-INR  APTT  COMPREHENSIVE METABOLIC PANEL  LIPID PANEL  I-STAT CG4 LACTIC ACID, ED  TROPONIN I (HIGH SENSITIVITY)     MDM    Code STEMI activated.  Cardiology with Dr. Bell Buckle Sink came and evaluated the patient in the emergency department.  Plans for emergently going to cardiac catheterization.  Patient actively vomiting so given IV Zofran, 500 bolus, aspirin and heparin bolus.  Patient taken to a cardiac catheterization emergently from the  ER.   PROCEDURES:  Critical Care performed: yes  .Critical Care  Performed by: Corena Herter, MD Authorized by: Corena Herter, MD   Critical care provider statement:    Critical care time (minutes):  30   Critical care time was exclusive of:  Separately billable procedures and treating other patients   Critical care was necessary to treat or prevent imminent or life-threatening deterioration of the following conditions:  Cardiac failure   Critical care was time spent personally by me on the following activities:  Development of treatment plan with patient or surrogate, discussions with consultants, evaluation of patient's response to treatment, examination of patient, ordering and review of laboratory studies, ordering and review of radiographic studies, ordering and performing treatments and interventions, pulse oximetry, re-evaluation of patient's condition and review of old charts   Care discussed with: admitting provider     Patient's presentation is most consistent with acute presentation with potential threat to life or bodily function.   MEDICATIONS ORDERED IN ED: Medications  sodium chloride flush (NS) 0.9 % injection 3-10 mL ( Intravenous Automatically Held 07/03/23 2200)  sodium chloride flush (NS) 0.9 % injection 3-10 mL ( Intravenous MAR Hold 06/25/23 0852)  lidocaine (PF) (XYLOCAINE) 1 % injection (2 mLs Infiltration Given 06/25/23 0903)  Heparin (Porcine) in NaCl 2000-0.9 UNIT/L-% SOLN (1,000 mLs  Given 06/25/23 0903)  fentaNYL (SUBLIMAZE) injection (25 mcg Intravenous Given 06/25/23 0904)  midazolam (VERSED) injection (1 mg Intravenous Given 06/25/23 1610)  aspirin chewable tablet 324 mg (324 mg Oral Given 06/25/23 0846)  heparin injection 4,000 Units (4,000 Units Intravenous Given 06/25/23 0846)  ondansetron (ZOFRAN) injection 4 mg (0 mg Intravenous Hold 06/25/23 0849)    FINAL CLINICAL IMPRESSION(S) / ED DIAGNOSES   Final diagnoses:  ST elevation myocardial  infarction (STEMI), unspecified artery (HCC)     Rx / DC Orders   ED Discharge Orders     None        Note:  This document was prepared using Dragon voice recognition software and may include unintentional dictation errors.   Corena Herter, MD 06/25/23 [phone]

## 2023-06-25 NOTE — Progress Notes (Signed)
  Echocardiogram 2D Echocardiogram has been performed.  Lucendia Herrlich 06/25/2023, 5:49 PM

## 2023-06-26 ENCOUNTER — Inpatient Hospital Stay (HOSPITAL_COMMUNITY): Payer: Medicaid Other

## 2023-06-26 ENCOUNTER — Encounter (HOSPITAL_COMMUNITY): Payer: Self-pay | Admitting: Internal Medicine

## 2023-06-26 DIAGNOSIS — I501 Left ventricular failure: Secondary | ICD-10-CM | POA: Diagnosis not present

## 2023-06-26 DIAGNOSIS — R570 Cardiogenic shock: Secondary | ICD-10-CM | POA: Diagnosis not present

## 2023-06-26 DIAGNOSIS — R079 Chest pain, unspecified: Secondary | ICD-10-CM | POA: Diagnosis not present

## 2023-06-26 DIAGNOSIS — Z452 Encounter for adjustment and management of vascular access device: Secondary | ICD-10-CM | POA: Diagnosis not present

## 2023-06-26 LAB — COMPREHENSIVE METABOLIC PANEL
ALT: 45 U/L — ABNORMAL HIGH (ref 0–44)
AST: 199 U/L — ABNORMAL HIGH (ref 15–41)
Albumin: 3 g/dL — ABNORMAL LOW (ref 3.5–5.0)
Alkaline Phosphatase: 46 U/L (ref 38–126)
Anion gap: 10 (ref 5–15)
BUN: 47 mg/dL — ABNORMAL HIGH (ref 6–20)
CO2: 20 mmol/L — ABNORMAL LOW (ref 22–32)
Calcium: 8.4 mg/dL — ABNORMAL LOW (ref 8.9–10.3)
Chloride: 104 mmol/L (ref 98–111)
Creatinine, Ser: 3.37 mg/dL — ABNORMAL HIGH (ref 0.61–1.24)
GFR, Estimated: 21 mL/min — ABNORMAL LOW (ref >60)
Glucose, Bld: 319 mg/dL — ABNORMAL HIGH (ref 70–99)
Potassium: 3.7 mmol/L (ref 3.5–5.1)
Sodium: 134 mmol/L — ABNORMAL LOW (ref 135–145)
Total Bilirubin: 1.3 mg/dL — ABNORMAL HIGH (ref <1.2)
Total Protein: 5.8 g/dL — ABNORMAL LOW (ref 6.5–8.1)

## 2023-06-26 LAB — BASIC METABOLIC PANEL
Anion gap: 11 (ref 5–15)
BUN: 56 mg/dL — ABNORMAL HIGH (ref 6–20)
CO2: 20 mmol/L — ABNORMAL LOW (ref 22–32)
Calcium: 8.2 mg/dL — ABNORMAL LOW (ref 8.9–10.3)
Chloride: 100 mmol/L (ref 98–111)
Creatinine, Ser: 4.06 mg/dL — ABNORMAL HIGH (ref 0.61–1.24)
GFR, Estimated: 17 mL/min — ABNORMAL LOW (ref >60)
Glucose, Bld: 295 mg/dL — ABNORMAL HIGH (ref 70–99)
Potassium: 3.8 mmol/L (ref 3.5–5.1)
Sodium: 131 mmol/L — ABNORMAL LOW (ref 135–145)

## 2023-06-26 LAB — HEPARIN LEVEL (UNFRACTIONATED)
Heparin Unfractionated: <0.1 [IU]/mL — ABNORMAL LOW (ref 0.30–0.70)
Heparin Unfractionated: <0.1 [IU]/mL — ABNORMAL LOW (ref 0.30–0.70)
Heparin Unfractionated: <0.1 [IU]/mL — ABNORMAL LOW (ref 0.30–0.70)

## 2023-06-26 LAB — CBC
HCT: 28.5 % — ABNORMAL LOW (ref 39.0–52.0)
Hemoglobin: 9.7 g/dL — ABNORMAL LOW (ref 13.0–17.0)
MCH: 28.7 pg (ref 26.0–34.0)
MCHC: 34 g/dL (ref 30.0–36.0)
MCV: 84.3 fL (ref 80.0–100.0)
Platelets: 270 10*3/uL (ref 150–400)
RBC: 3.38 MIL/uL — ABNORMAL LOW (ref 4.22–5.81)
RDW: 13.2 % (ref 11.5–15.5)
WBC: 19.2 10*3/uL — ABNORMAL HIGH (ref 4.0–10.5)
nRBC: 0 % (ref 0.0–0.2)

## 2023-06-26 LAB — COOXEMETRY PANEL
Carboxyhemoglobin: 1.5 % (ref 0.5–1.5)
Carboxyhemoglobin: 1.4 % (ref 0.5–1.5)
Methemoglobin: <0.7 % (ref 0.0–1.5)
Methemoglobin: <0.7 % (ref 0.0–1.5)
O2 Saturation: 60 %
O2 Saturation: 63.7 %
Total hemoglobin: 10 g/dL — ABNORMAL LOW (ref 12.0–16.0)
Total hemoglobin: 9.6 g/dL — ABNORMAL LOW (ref 12.0–16.0)

## 2023-06-26 LAB — ECHOCARDIOGRAM LIMITED
Height: 75 in
Weight: 3379.21 [oz_av]

## 2023-06-26 LAB — GLUCOSE, CAPILLARY
Glucose-Capillary: 283 mg/dL — ABNORMAL HIGH (ref 70–99)
Glucose-Capillary: 271 mg/dL — ABNORMAL HIGH (ref 70–99)
Glucose-Capillary: 288 mg/dL — ABNORMAL HIGH (ref 70–99)
Glucose-Capillary: 273 mg/dL — ABNORMAL HIGH (ref 70–99)

## 2023-06-26 LAB — LACTATE DEHYDROGENASE: LDH: 1261 U/L — ABNORMAL HIGH (ref 98–192)

## 2023-06-26 LAB — PROCALCITONIN: Procalcitonin: 0.25 ng/mL

## 2023-06-26 MED ORDER — INSULIN ASPART 100 UNIT/ML IJ SOLN: 5.0000 [IU] | Freq: Three times a day (TID) | INTRAMUSCULAR | Status: DC

## 2023-06-26 MED ORDER — ONDANSETRON HCL 4 MG/2ML IJ SOLN
4.0000 mg | Freq: Four times a day (QID) | INTRAMUSCULAR | Status: DC | PRN
  Administered 2023-06-26 – 2023-07-02 (×6): 4 mg via INTRAVENOUS
  Filled 2023-06-26 (×7): qty 2

## 2023-06-26 MED ORDER — CHLORHEXIDINE GLUCONATE CLOTH 2 % EX PADS
6.0000 | MEDICATED_PAD | Freq: Every day | CUTANEOUS | Status: DC
  Administered 2023-06-26 – 2023-07-04 (×9): 6 via TOPICAL

## 2023-06-26 MED ORDER — POTASSIUM CHLORIDE CRYS ER 20 MEQ PO TBCR
20.0000 meq | EXTENDED_RELEASE_TABLET | Freq: Once | ORAL | Status: AC
  Administered 2023-06-26: 20 meq via ORAL
  Filled 2023-06-26: qty 1

## 2023-06-26 MED ORDER — SENNOSIDES-DOCUSATE SODIUM 8.6-50 MG PO TABS
1.0000 | ORAL_TABLET | Freq: Two times a day (BID) | ORAL | Status: DC
  Administered 2023-06-26 – 2023-07-04 (×9): 1 via ORAL
  Filled 2023-06-26 (×9): qty 1

## 2023-06-26 MED ORDER — SODIUM CHLORIDE 0.9 % IV SOLN
12.5000 mg | Freq: Four times a day (QID) | INTRAVENOUS | Status: DC | PRN
  Administered 2023-06-26 – 2023-06-27 (×5): 12.5 mg via INTRAVENOUS
  Filled 2023-06-26: qty 12.5
  Filled 2023-06-26: qty 0.5
  Filled 2023-06-26 (×2): qty 12.5
  Filled 2023-06-26: qty 0.5

## 2023-06-26 MED ORDER — INSULIN GLARGINE-YFGN 100 UNIT/ML ~~LOC~~ SOLN
15.0000 [IU] | Freq: Every day | SUBCUTANEOUS | Status: DC
  Administered 2023-06-26: 15 [IU] via SUBCUTANEOUS
  Filled 2023-06-26 (×2): qty 0.15

## 2023-06-26 MED ORDER — POLYETHYLENE GLYCOL 3350 17 G PO PACK
17.0000 g | PACK | Freq: Every day | ORAL | Status: DC
  Administered 2023-06-26 – 2023-07-01 (×4): 17 g via ORAL
  Filled 2023-06-26 (×4): qty 1

## 2023-06-26 MED ORDER — SODIUM CHLORIDE 0.9 % IV SOLN
2.0000 g | INTRAVENOUS | Status: AC
  Administered 2023-06-26 – 2023-06-30 (×5): 2 g via INTRAVENOUS
  Filled 2023-06-26 (×5): qty 20

## 2023-06-26 MED ORDER — ENSURE ENLIVE PO LIQD
237.0000 mL | Freq: Two times a day (BID) | ORAL | Status: DC
  Administered 2023-06-26: 237 mL via ORAL

## 2023-06-26 NOTE — Plan of Care (Signed)
  Problem: Education: Goal: Knowledge of General Education information will improve Description: Including pain rating scale, medication(s)/side effects and non-pharmacologic comfort measures Outcome: Progressing   Problem: Health Behavior/Discharge Planning: Goal: Ability to manage health-related needs will improve Outcome: Not Progressing   Problem: Clinical Measurements: Goal: Ability to maintain clinical measurements within normal limits will improve Outcome: Progressing Goal: Will remain free from infection Outcome: Not Progressing Goal: Diagnostic test results will improve Outcome: Not Progressing Goal: Respiratory complications will improve Outcome: Progressing Goal: Cardiovascular complication will be avoided Outcome: Not Progressing   Problem: Clinical Measurements: Goal: Will remain free from infection Outcome: Not Progressing   Problem: Clinical Measurements: Goal: Diagnostic test results will improve Outcome: Not Progressing   Problem: Clinical Measurements: Goal: Respiratory complications will improve Outcome: Progressing   Problem: Activity: Goal: Risk for activity intolerance will decrease Outcome: Not Progressing   Problem: Nutrition: Goal: Adequate nutrition will be maintained Outcome: Not Progressing

## 2023-06-26 NOTE — Progress Notes (Signed)
PHARMACY - ANTICOAGULATION CONSULT NOTE  Pharmacy Consult for heparin Indication:  Impella CP/apical thrombus  No Known Allergies  Patient Measurements: Height: 6\' 3"  (190.5 cm) Weight: 95.8 kg (211 lb 3.2 oz) IBW/kg (Calculated) : 84.5 Heparin Dosing Weight: 107 kg  Vital Signs: Temp: 99.3 F (37.4 C) (12/28 0726) Temp Source: Core (12/28 0400) BP: 109/72 (12/28 0615) Pulse Rate: 112 (12/28 0726)  Labs: Recent Labs    06/25/23 0822 06/25/23 1023 06/25/23 1206 06/25/23 1320 06/25/23 2201 06/26/23 0355 06/26/23 0430  HGB 12.4*   < > 11.6* 11.3*  --  9.7*  --   HCT 37.4*   < > 34.0* 33.1*  --  28.5*  --   PLT 348  --   --  336  --  270  --   APTT 31  --   --   --   --   --   --   LABPROT 14.6  --   --   --   --   --   --   INR 1.1  --   --   --   --   --   --   HEPARINUNFRC  --   --   --   --  <0.10*  --  <0.10*  CREATININE 2.42*  --  2.30*  --   --  3.37*  --   TROPONINIHS >24,000*  --   --   --   --   --   --    < > = values in this interval not displayed.    Estimated Creatinine Clearance: 30.6 mL/min (A) (by C-G formula based on SCr of 3.37 mg/dL (H)).   Medical History: Past Medical History:  Diagnosis Date   Depression 1989   Diabetes mellitus without complication (HCC)    Gastroesophageal reflux disease 01/31/2014   HLD (hyperlipidemia) 03/12/2023   Hypertension    ST elevation myocardial infarction (STEMI) (HCC) 06/25/2023   Ulcer 2016      Assessment: 66 yoM admitted as late presenting STEMI s/p unsuccessful PCI and Impella CP placement. Pharmacy to manage heparin. Bicarbonate purge started. No AC PTA.  Impella CP running at P8 - no issues per RN, on sodium bicarb purge solution. Heparin level is subtherapeutic at <0.1, on 500 units/hr. Of note, ECHO showing apical thrombus - will need to get level into therapeutic range. Hgb 9.7, plt 270. No s/sx of bleeding noted.   Goal of Therapy:  Heparin level 0.3-0.5 units/ml Monitor platelets by  anticoagulation protocol: Yes   Plan:  Continue sodium bicarbonate purge Increase heparin to 700 units/hr Monitor daily heparin level, CBC, signs/symptoms of bleeding   Thank you for allowing pharmacy to participate in this patient's care,  Sherron Monday, PharmD, BCCCP Clinical Pharmacist  Phone: [phone] 06/26/2023 7:56 AM  Please check AMION for all [facility] Pharmacy phone numbers After 10:00 PM, call Main Pharmacy [phone]

## 2023-06-26 NOTE — Progress Notes (Addendum)
Patient ID: Gabriel Shaw, male   DOB: [birth year], 52 y.o.   MRN: [id number]     Advanced Heart Failure Rounding Note  Cardiologist: None   Subjective:    Patient is on milrinone 0.25, NE 2.  UOP 635, creatinine 2.3 => 3.37.   Tm 100.8, WBCs 19  No further bleeding at Impella site, hgb 9.7.   Echo this am: EF 25% with LAD territory WMAs, RV normal, Impella measuring deep at 4.5 cm but good position.   Impella CP: P8  Flow 3.4 L/min  Swan: CVP 3 PA 26/17 CI 2.7  Co-ox 60%   Objective:   Weight Range: 95.8 kg Body mass index is 26.4 kg/m.   Vital Signs:   Temp:  [98.3 F (36.8 C)-100.9 F (38.3 C)] 99.3 F (37.4 C) (12/28 0726) Pulse Rate:  [0-128] 112 (12/28 0726) Resp:  [3-29] 11 (12/28 0726) BP: (80-158)/(48-138) 109/72 (12/28 0615) SpO2:  [86 %-100 %] 99 % (12/28 0726) Weight:  [95 kg-107.2 kg] 95.8 kg (12/28 0500) Last BM Date :  (PTA)  Weight change: Filed Weights   06/25/23 1230 06/25/23 1830 06/26/23 0500  Weight: 107.2 kg (S) 95 kg 95.8 kg    Intake/Output:   Intake/Output Summary (Last 24 hours) at 06/26/2023 0804 Last data filed at 06/26/2023 0700 Gross per 24 hour  Intake 2369.38 ml  Output 635 ml  Net 1734.38 ml      Physical Exam    General:  Well appearing. No resp difficulty HEENT: Normal Neck: Supple. JVP not elevated. Carotids 2+ bilat; no bruits. No lymphadenopathy or thyromegaly appreciated. Cor: PMI nondisplaced. Regular rate & rhythm. No rubs, gallops or murmurs. Lungs: Clear Abdomen: Soft, nontender, nondistended. No hepatosplenomegaly. No bruits or masses. Good bowel sounds. Extremities: No cyanosis, clubbing, rash, edema. Impella right groin.  Neuro: Alert & orientedx3, cranial nerves grossly intact. moves all 4 extremities w/o difficulty. Affect pleasant   Telemetry   Sinus tachy 110s (personally reviewed)  Labs    CBC Recent Labs    06/25/23 0822 06/25/23 1023 06/25/23 1320 06/26/23 0355  WBC 19.5*  --   19.3* 19.2*  NEUTROABS 16.0*  --   --   --   HGB 12.4*   < > 11.3* 9.7*  HCT 37.4*   < > 33.1* 28.5*  MCV 87.2  --  84.9 84.3  PLT 348  --  336 270   < > = values in this interval not displayed.   Basic Metabolic Panel Recent Labs    78/29/56 0822 06/25/23 1023 06/25/23 1206 06/26/23 0355  NA 139   < > 139 134*  K 4.3   < > 4.8 3.7  CL 106  --  106 104  CO2 18*  --   --  20*  GLUCOSE 256*  --  301* 319*  BUN 39*  --  41* 47*  CREATININE 2.42*  --  2.30* 3.37*  CALCIUM 8.7*  --   --  8.4*   < > = values in this interval not displayed.   Liver Function Tests Recent Labs    06/25/23 0822 06/26/23 0355  AST 328* 199*  ALT 67* 45*  ALKPHOS 60 46  BILITOT 1.3* 1.3*  PROT 7.2 5.8*  ALBUMIN 3.6 3.0*   No results for input(s): "LIPASE", "AMYLASE" in the last 72 hours. Cardiac Enzymes No results for input(s): "CKTOTAL", "CKMB", "CKMBINDEX", "TROPONINI" in the last 72 hours.  BNP: BNP (last 3 results) No results for input(s): "BNP" in  the last 8760 hours.  ProBNP (last 3 results) No results for input(s): "PROBNP" in the last 8760 hours.   D-Dimer No results for input(s): "DDIMER" in the last 72 hours. Hemoglobin A1C No results for input(s): "HGBA1C" in the last 72 hours. Fasting Lipid Panel Recent Labs    06/25/23 0822  CHOL 120  HDL 52  LDLCALC 52  TRIG 78  CHOLHDL 2.3   Thyroid Function Tests No results for input(s): "TSH", "T4TOTAL", "T3FREE", "THYROIDAB" in the last 72 hours.  Invalid input(s): "FREET3"  Other results:   Imaging    ECHOCARDIOGRAM COMPLETE Result Date: 06/25/2023    ECHOCARDIOGRAM REPORT   Patient Name:   Gabriel Shaw Date of Exam: 06/25/2023 Medical Rec #:  [id number]     Height:       75.0 in Accession #:    [id number]    Weight:       236.3 lb Date of Birth:  Jan 09, 1971    BSA:          2.356 m Patient Age:    52 years      BP:           115/105 mmHg Patient Gender: M             HR:           116 bpm. Exam Location:  Inpatient  Procedure: 2D Echo, Cardiac Doppler, Color Doppler and Intracardiac            Opacification Agent Indications:    CHF-Acute Systolic I50.21  History:        Patient has no prior history of Echocardiogram examinations.                 Acute MI, PAD; Risk Factors:Diabetes and Dyslipidemia.  Sonographer:    Lucendia Herrlich RCS Referring Phys: [id number] BRITTAINY M SIMMONS IMPRESSIONS  1. Inferolateral akinesis; otherwise global hypokinesis; overall severe LV dysfunction; Impella noted. Probable apical thrombus noted.  2. Left ventricular ejection fraction, by estimation, is <20%. The left ventricle has severely decreased function. The left ventricle demonstrates regional wall motion abnormalities (see scoring diagram/findings for description). There is mild left ventricular hypertrophy. Left ventricular diastolic parameters are indeterminate.  3. Right ventricular systolic function is normal. The right ventricular size is normal.  4. The mitral valve is normal in structure. No evidence of mitral valve regurgitation. No evidence of mitral stenosis.  5. The aortic valve is normal in structure. Aortic valve regurgitation is not visualized. No aortic stenosis is present.  6. The inferior vena cava is dilated in size with >50% respiratory variability, suggesting right atrial pressure of 8 mmHg. Comparison(s): No prior Echocardiogram. FINDINGS  Left Ventricle: Left ventricular ejection fraction, by estimation, is <20%. The left ventricle has severely decreased function. The left ventricle demonstrates regional wall motion abnormalities. Definity contrast agent was given IV to delineate the left ventricular endocardial borders. The left ventricular internal cavity size was normal in size. There is mild left ventricular hypertrophy. Left ventricular diastolic parameters are indeterminate. Right Ventricle: The right ventricular size is normal. No increase in right ventricular wall thickness. Right ventricular systolic function is  normal. Left Atrium: Left atrial size was normal in size. Right Atrium: Right atrial size was normal in size. Pericardium: Trivial pericardial effusion is present. Mitral Valve: The mitral valve is normal in structure. No evidence of mitral valve regurgitation. No evidence of mitral valve stenosis. Tricuspid Valve: The tricuspid valve is normal in structure. Tricuspid valve regurgitation  is not demonstrated. No evidence of tricuspid stenosis. Aortic Valve: The aortic valve is normal in structure. Aortic valve regurgitation is not visualized. No aortic stenosis is present. Pulmonic Valve: The pulmonic valve was normal in structure. Pulmonic valve regurgitation is not visualized. No evidence of pulmonic stenosis. Aorta: The aortic root is normal in size and structure. Venous: The inferior vena cava is dilated in size with greater than 50% respiratory variability, suggesting right atrial pressure of 8 mmHg. IAS/Shunts: No atrial level shunt detected by color flow Doppler. Additional Comments: Inferolateral akinesis; otherwise global hypokinesis; overall severe LV dysfunction; Impella noted. Probable apical thrombus noted.  LEFT VENTRICLE PLAX 2D LVIDd:         5.15 cm   Diastology LVIDs:         5.05 cm   LV e' medial:    7.62 cm/s LV PW:         1.30 cm   LV E/e' medial:  7.0 LV IVS:        0.75 cm   LV e' lateral:   5.52 cm/s LVOT diam:     2.00 cm   LV E/e' lateral: 9.6 LV SV:         25 LV SV Index:   11 LVOT Area:     3.14 cm  RIGHT VENTRICLE             IVC RV S prime:     12.40 cm/s  IVC diam: 2.40 cm TAPSE (M-mode): 1.2 cm LEFT ATRIUM             Index        RIGHT ATRIUM          Index LA diam:        3.55 cm 1.51 cm/m   RA Area:     9.41 cm LA Vol (A2C):   29.7 ml 12.61 ml/m  RA Volume:   16.20 ml 6.88 ml/m LA Vol (A4C):   41.8 ml 17.74 ml/m LA Biplane Vol: 35.6 ml 15.11 ml/m  AORTIC VALVE LVOT Vmax:   78.18 cm/s LVOT Vmean:  48.300 cm/s LVOT VTI:    0.080 m  AORTA Ao Root diam: 3.30 cm Ao Asc diam:   3.50 cm MITRAL VALVE MV Area (PHT): 5.42 cm    SHUNTS MV Decel Time: 140 msec    Systemic VTI:  0.08 m MV E velocity: 53.00 cm/s  Systemic Diam: 2.00 cm MV A velocity: 75.10 cm/s MV E/A ratio:  0.71 Olga Millers MD Electronically signed by Olga Millers MD Signature Date/Time: 06/25/2023/6:02:23 PM    Final    CARDIAC CATHETERIZATION Result Date: 06/25/2023   LPAV lesion is 70% stenosed.   Mid LAD to Dist LAD lesion is 100% stenosed.   Mid LAD lesion is 70% stenosed.   Prox RCA lesion is 90% stenosed.   Prox RCA to Mid RCA lesion is 90% stenosed.   There is severe left ventricular systolic dysfunction.   LV end diastolic pressure is moderately elevated.   The left ventricular ejection fraction is less than 25% by visual estimate. 1.  Heavily calcified coronary arteries especially the LAD with occluded mid to distal LAD which is the likely culprit for anterior/inferior STEMI (wraparound LAD) with late presentation.  In addition, there is 70% stenosis in the distal left circumflex and diffuse disease in a small nondominant right coronary artery.  The coronary arteries are diffusely diseased overall. 2.  Severely reduced LV systolic function with an EF less  than 20% with mid to distal anterior and apical akinesis. 3.  Attempted unsuccessful PCI of the LAD due to inability to cross the occlusion in spite of using 3 different wires.  Suspect acute on chronic occlusion with onset being 2 days ago. 4.  Successful Impella CP mechanical support device placement via the right common femoral artery due to cardiogenic shock. 5.  Right heart catheterization at the end showed normal RA pressure, mild pulmonary hypertension and moderately reduced cardiac output. Recommendations: Transfer to [facility] for further support. No percutaneous or surgical revascularization option to the LAD in that area is likely nonviable at this point.  I do not think the patient benefits from PCI to the distal left circumflex.  Will  treat medically for coronary artery disease at this time. I discussed the case with Dr. Lynnette Caffey and Dr. Gala Romney.    Medications:     Scheduled Medications:  aspirin EC  81 mg Oral Daily   atorvastatin  80 mg Oral Daily   Chlorhexidine Gluconate Cloth  6 each Topical Daily   feeding supplement  237 mL Oral BID BM   insulin aspart  0-5 Units Subcutaneous QHS   insulin aspart  0-9 Units Subcutaneous TID WC   insulin aspart  5 Units Subcutaneous TID WC   insulin glargine-yfgn  15 Units Subcutaneous Daily   polyethylene glycol  17 g Oral Daily   potassium chloride  20 mEq Oral Once   senna-docusate  1 tablet Oral BID   sodium chloride flush  3 mL Intravenous Q12H    Infusions:  sodium chloride     cefTRIAXone (ROCEPHIN)  IV     heparin 500 Units/hr (06/26/23 0700)   milrinone 0.25 mcg/kg/min (06/25/23 2213)   norepinephrine (LEVOPHED) Adult infusion 2 mcg/min (06/25/23 1333)   sodium bicarbonate 25 mEq (Impella PURGE) in dextrose 5 % 1000 mL bag      PRN Medications: sodium chloride, acetaminophen, ondansetron (ZOFRAN) IV, mouth rinse, sodium chloride flush    Assessment/Plan   1. CAD: Late presenting anterior/inferior STEMI/CAD.  70% mid LAD, 100% m-dLAD, 90% pRCA, 90% mRCA, 70% LPAV.  Disease not amendable to PCI, failed attempt to cross LAD occlusion x 3.  No chest pain.  - medical management  - ASA + statin   - no ? blocker w/ shock  2. Acute Systolic Heart Failure/Cardiogenic shock: Ischemic cardiomyopathy, echo today with EF 25% with LAD territory WMAs, RV normal. Impella measuring deep at 4.5 cm today but good position. Worry that he has nonviable LAD-territory myocardium. RHC with mean RA 6, mean PCWP 20, CI 1.93.  Impella CP placed. On milrinone 0.25 and NE 2. UOP 635, CVP 3 this morning with CI 2.7, co-ox 60%.  Creatinine up to 3.37.  - Continue milrinone 0.25, would continue NE 2 to keep MAP stable.  - Weaned Impella CP to P6, check co-ox in 1 hour.  - Follow  LDH. - On heparin gtt with Impella and LV thrombus.  - If we cannot wean off Impella, will need to consider LVAD but AKI and Jehovah's Witness could make this difficult. 3. AKI: SCr 1.3 => 2.42 => 3.37. Still making urine. MAP stable. Suspect ATN in setting of cardiogenic shock.  - Maintain CO and MAP.  4. Elevated LFTs: Suspect shock liver, trending down.  5. ID: Tm 100.8 with WBCs 19.5.  MRSA PCR negative.  - blood cultures - CXR - procalcitonin - Cover with ceftriaxone empirically. .  6. Type 2DM: SSI  7. Anemia: Blood loss at Impella site initially, seems to have stopped.  Hgb 9.7 today.  Jehov[employer] so no blood products.  - he would NOT accept any blood products even in a life and death situation.  8. LV thrombus: Has been on HCO3 purge, heparin started.   CRITICAL CARE Performed by: Marca Ancona  Total critical care time: 45 minutes  Critical care time was exclusive of separately billable procedures and treating other patients.  Critical care was necessary to treat or prevent imminent or life-threatening deterioration.  Critical care was time spent personally by me on the following activities: development of treatment plan with patient and/or surrogate as well as nursing, discussions with consultants, evaluation of patient's response to treatment, examination of patient, obtaining history from patient or surrogate, ordering and performing treatments and interventions, ordering and review of laboratory studies, ordering and review of radiographic studies, pulse oximetry and re-evaluation of patient's condition.   Length of Stay: 1  Marca Ancona, MD  06/26/2023, 8:04 AM  Advanced Heart Failure Team Pager [phone] (M-F; 7a - 5p)  Please contact [facility] for night-coverage after hours (5p -7a ) and weekends on [email or web address]

## 2023-06-26 NOTE — Plan of Care (Signed)

## 2023-06-26 NOTE — Progress Notes (Signed)
  Echocardiogram 2D Echocardiogram has been performed.  Gabriel Shaw 06/26/2023, 8:18 AM

## 2023-06-26 NOTE — Progress Notes (Signed)
PHARMACY - ANTICOAGULATION CONSULT NOTE  Pharmacy Consult for heparin Indication:  Impella CP/apical thrombus  No Known Allergies  Patient Measurements: Height: 6\' 3"  (190.5 cm) Weight: 95.8 kg (211 lb 3.2 oz) IBW/kg (Calculated) : 84.5 Heparin Dosing Weight: 107 kg  Vital Signs: Temp: 99.5 F (37.5 C) (12/28 1515) Temp Source: Core (12/28 0800) BP: 108/73 (12/28 1515) Pulse Rate: 116 (12/28 1515)  Labs: Recent Labs    06/25/23 0822 06/25/23 1023 06/25/23 1206 06/25/23 1320 06/25/23 2201 06/26/23 0355 06/26/23 0430 06/26/23 1502  HGB 12.4*   < > 11.6* 11.3*  --  9.7*  --   --   HCT 37.4*   < > 34.0* 33.1*  --  28.5*  --   --   PLT 348  --   --  336  --  270  --   --   APTT 31  --   --   --   --   --   --   --   LABPROT 14.6  --   --   --   --   --   --   --   INR 1.1  --   --   --   --   --   --   --   HEPARINUNFRC  --   --   --   --  <0.10*  --  <0.10* <0.10*  CREATININE 2.42*  --  2.30*  --   --  3.37*  --   --   TROPONINIHS >24,000*  --   --   --   --   --   --   --    < > = values in this interval not displayed.    Estimated Creatinine Clearance: 30.6 mL/min (A) (by C-G formula based on SCr of 3.37 mg/dL (H)).   Medical History: Past Medical History:  Diagnosis Date   Depression 1989   Diabetes mellitus without complication (HCC)    Gastroesophageal reflux disease 01/31/2014   HLD (hyperlipidemia) 03/12/2023   Hypertension    ST elevation myocardial infarction (STEMI) (HCC) 06/25/2023   Ulcer 2016     Assessment: 72 yoM admitted as late presenting STEMI s/p unsuccessful PCI and Impella CP placement. ECHO showing large apical thrombus. Pharmacy to manage heparin. Bicarbonate purge started. No AC PTA.  Impella CP running at P6 ~ 2.9 L/min, purge at 10.60ml/hr. Heparin level < 0.1 is subtherapeutic on 700 units/hr.  No issues with infusion or bleeding per RN. Will increase cautiously given impella.  Goal of Therapy:  Heparin level 0.3-0.5  units/ml Monitor platelets by anticoagulation protocol: Yes   Plan:  Increase heparin to 900 units/hr F/u 8hr heparin level  Continue sodium bicarbonate purge Monitor daily heparin level, CBC, signs/symptoms of bleeding   Thank you for allowing pharmacy to participate in this patient's care,  Alphia Moh, PharmD, BCPS, BCCP Clinical Pharmacist  Please check AMION for all [facility] Pharmacy phone numbers After 10:00 PM, call Main Pharmacy [phone]

## 2023-06-26 NOTE — Progress Notes (Signed)
PHARMACY - ANTICOAGULATION CONSULT NOTE  Pharmacy Consult for heparin Indication:  Impella CP/apical thrombus  No Known Allergies  Patient Measurements: Height: 6\' 3"  (190.5 cm) Weight: 95.8 kg (211 lb 3.2 oz) IBW/kg (Calculated) : 84.5 Heparin Dosing Weight: 107 kg  Vital Signs: Temp: 99.9 F (37.7 C) (12/28 2030) Temp Source: Bladder (12/28 2000) BP: 109/78 (12/28 2030) Pulse Rate: 116 (12/28 2030)  Labs: Recent Labs    06/25/23 0822 06/25/23 1023 06/25/23 1206 06/25/23 1320 06/25/23 2201 06/26/23 0355 06/26/23 0430 06/26/23 1502 06/26/23 2223  HGB 12.4*   < > 11.6* 11.3*  --  9.7*  --   --   --   HCT 37.4*   < > 34.0* 33.1*  --  28.5*  --   --   --   PLT 348  --   --  336  --  270  --   --   --   APTT 31  --   --   --   --   --   --   --   --   LABPROT 14.6  --   --   --   --   --   --   --   --   INR 1.1  --   --   --   --   --   --   --   --   HEPARINUNFRC  --   --   --   --    < >  --  <0.10* <0.10* <0.10*  CREATININE 2.42*  --  2.30*  --   --  3.37*  --  4.06*  --   TROPONINIHS >24,000*  --   --   --   --   --   --   --   --    < > = values in this interval not displayed.    Estimated Creatinine Clearance: 25.4 mL/min (A) (by C-G formula based on SCr of 4.06 mg/dL (H)).   Medical History: Past Medical History:  Diagnosis Date   Depression 1989   Diabetes mellitus without complication (HCC)    Gastroesophageal reflux disease 01/31/2014   HLD (hyperlipidemia) 03/12/2023   Hypertension    ST elevation myocardial infarction (STEMI) (HCC) 06/25/2023   Ulcer 2016     Assessment: 52 yoM admitted as late presenting STEMI s/p unsuccessful PCI and Impella CP placement. ECHO showing large apical thrombus. Pharmacy to manage heparin. Bicarbonate purge started. No AC PTA.  12/28 AM update:  Heparin level sub-therapeutic   Goal of Therapy:  Heparin level 0.3-0.5 units/ml Monitor platelets by anticoagulation protocol: Yes   Plan:  Increase heparin to 1100  units/hr Heparin level in 6-8 hours Monitor daily heparin level, CBC, signs/symptoms of bleeding   Abran Duke, PharmD, BCPS Clinical Pharmacist Phone: [phone]

## 2023-06-27 ENCOUNTER — Inpatient Hospital Stay (HOSPITAL_COMMUNITY): Payer: Medicaid Other

## 2023-06-27 DIAGNOSIS — I5021 Acute systolic (congestive) heart failure: Secondary | ICD-10-CM | POA: Diagnosis not present

## 2023-06-27 DIAGNOSIS — R570 Cardiogenic shock: Secondary | ICD-10-CM | POA: Diagnosis not present

## 2023-06-27 LAB — COMPREHENSIVE METABOLIC PANEL
ALT: 33 U/L (ref 0–44)
AST: 72 U/L — ABNORMAL HIGH (ref 15–41)
Albumin: 2.8 g/dL — ABNORMAL LOW (ref 3.5–5.0)
Alkaline Phosphatase: 45 U/L (ref 38–126)
Anion gap: 12 (ref 5–15)
BUN: 58 mg/dL — ABNORMAL HIGH (ref 6–20)
CO2: 18 mmol/L — ABNORMAL LOW (ref 22–32)
Calcium: 7.9 mg/dL — ABNORMAL LOW (ref 8.9–10.3)
Chloride: 102 mmol/L (ref 98–111)
Creatinine, Ser: 5.21 mg/dL — ABNORMAL HIGH (ref 0.61–1.24)
GFR, Estimated: 12 mL/min — ABNORMAL LOW (ref >60)
Glucose, Bld: 288 mg/dL — ABNORMAL HIGH (ref 70–99)
Potassium: 3.9 mmol/L (ref 3.5–5.1)
Sodium: 132 mmol/L — ABNORMAL LOW (ref 135–145)
Total Bilirubin: 0.8 mg/dL (ref <1.2)
Total Protein: 5.6 g/dL — ABNORMAL LOW (ref 6.5–8.1)

## 2023-06-27 LAB — GLUCOSE, CAPILLARY
Glucose-Capillary: 344 mg/dL — ABNORMAL HIGH (ref 70–99)
Glucose-Capillary: 318 mg/dL — ABNORMAL HIGH (ref 70–99)
Glucose-Capillary: 336 mg/dL — ABNORMAL HIGH (ref 70–99)
Glucose-Capillary: 295 mg/dL — ABNORMAL HIGH (ref 70–99)
Glucose-Capillary: 265 mg/dL — ABNORMAL HIGH (ref 70–99)
Glucose-Capillary: 204 mg/dL — ABNORMAL HIGH (ref 70–99)
Glucose-Capillary: 182 mg/dL — ABNORMAL HIGH (ref 70–99)
Glucose-Capillary: 172 mg/dL — ABNORMAL HIGH (ref 70–99)
Glucose-Capillary: 163 mg/dL — ABNORMAL HIGH (ref 70–99)
Glucose-Capillary: 155 mg/dL — ABNORMAL HIGH (ref 70–99)

## 2023-06-27 LAB — CBC
HCT: 24.5 % — ABNORMAL LOW (ref 39.0–52.0)
HCT: 24.1 % — ABNORMAL LOW (ref 39.0–52.0)
Hemoglobin: 8.6 g/dL — ABNORMAL LOW (ref 13.0–17.0)
Hemoglobin: 8.5 g/dL — ABNORMAL LOW (ref 13.0–17.0)
MCH: 29 pg (ref 26.0–34.0)
MCH: 29 pg (ref 26.0–34.0)
MCHC: 35.1 g/dL (ref 30.0–36.0)
MCHC: 35.3 g/dL (ref 30.0–36.0)
MCV: 82.5 fL (ref 80.0–100.0)
MCV: 82.3 fL (ref 80.0–100.0)
Platelets: 193 10*3/uL (ref 150–400)
Platelets: 195 10*3/uL (ref 150–400)
RBC: 2.97 MIL/uL — ABNORMAL LOW (ref 4.22–5.81)
RBC: 2.93 MIL/uL — ABNORMAL LOW (ref 4.22–5.81)
RDW: 13.2 % (ref 11.5–15.5)
RDW: 13.2 % (ref 11.5–15.5)
WBC: 18.2 10*3/uL — ABNORMAL HIGH (ref 4.0–10.5)
WBC: 15.8 10*3/uL — ABNORMAL HIGH (ref 4.0–10.5)
nRBC: 0 % (ref 0.0–0.2)
nRBC: 0 % (ref 0.0–0.2)

## 2023-06-27 LAB — COOXEMETRY PANEL
Carboxyhemoglobin: 1.5 % (ref 0.5–1.5)
Methemoglobin: <0.7 % (ref 0.0–1.5)
O2 Saturation: 58.7 %
Total hemoglobin: 9 g/dL — ABNORMAL LOW (ref 12.0–16.0)

## 2023-06-27 LAB — BASIC METABOLIC PANEL
Anion gap: 14 (ref 5–15)
BUN: 68 mg/dL — ABNORMAL HIGH (ref 6–20)
CO2: 18 mmol/L — ABNORMAL LOW (ref 22–32)
Calcium: 8.2 mg/dL — ABNORMAL LOW (ref 8.9–10.3)
Chloride: 98 mmol/L (ref 98–111)
Creatinine, Ser: 5.87 mg/dL — ABNORMAL HIGH (ref 0.61–1.24)
GFR, Estimated: 11 mL/min — ABNORMAL LOW (ref >60)
Glucose, Bld: 337 mg/dL — ABNORMAL HIGH (ref 70–99)
Potassium: 4 mmol/L (ref 3.5–5.1)
Sodium: 130 mmol/L — ABNORMAL LOW (ref 135–145)

## 2023-06-27 LAB — ECHOCARDIOGRAM LIMITED
Height: 75 in
Weight: 3114.66 [oz_av]

## 2023-06-27 LAB — HEPARIN LEVEL (UNFRACTIONATED)
Heparin Unfractionated: <0.1 [IU]/mL — ABNORMAL LOW (ref 0.30–0.70)
Heparin Unfractionated: 0.1 [IU]/mL — ABNORMAL LOW (ref 0.30–0.70)

## 2023-06-27 LAB — LACTATE DEHYDROGENASE: LDH: 1012 U/L — ABNORMAL HIGH (ref 98–192)

## 2023-06-27 MED ORDER — HEPARIN SODIUM (PORCINE) 5000 UNIT/ML IJ SOLN
INTRAMUSCULAR | Status: AC
  Filled 2023-06-27: qty 3

## 2023-06-27 MED ORDER — AMIODARONE HCL IN DEXTROSE 360-4.14 MG/200ML-% IV SOLN
60.0000 mg/h | INTRAVENOUS | Status: DC
  Administered 2023-06-27 – 2023-07-01 (×10): 30 mg/h via INTRAVENOUS
  Administered 2023-07-02 – 2023-07-04 (×10): 60 mg/h via INTRAVENOUS
  Filled 2023-06-27 (×16): qty 200
  Filled 2023-06-27: qty 400
  Filled 2023-06-27 (×2): qty 200
  Filled 2023-06-27: qty 400

## 2023-06-27 MED ORDER — INSULIN REGULAR(HUMAN) IN NACL 100-0.9 UT/100ML-% IV SOLN
INTRAVENOUS | Status: DC
  Administered 2023-06-27: 11 [IU]/h via INTRAVENOUS
  Filled 2023-06-27 (×2): qty 100

## 2023-06-27 MED ORDER — HALOPERIDOL LACTATE 5 MG/ML IJ SOLN: 2.0000 mg | Freq: Four times a day (QID) | INTRAMUSCULAR | Status: DC | PRN

## 2023-06-27 MED ORDER — DEXTROSE 50 % IV SOLN: 0.0000 mL | INTRAVENOUS | Status: DC | PRN

## 2023-06-27 MED ORDER — INSULIN ASPART 100 UNIT/ML IJ SOLN: 0.0000 [IU] | Freq: Every day | INTRAMUSCULAR | Status: DC

## 2023-06-27 MED ORDER — INSULIN ASPART 100 UNIT/ML IJ SOLN: 0.0000 [IU] | INTRAMUSCULAR | Status: DC

## 2023-06-27 MED ORDER — INSULIN ASPART 100 UNIT/ML IJ SOLN
0.0000 [IU] | Freq: Three times a day (TID) | INTRAMUSCULAR | Status: DC
  Administered 2023-06-27: 11 [IU] via SUBCUTANEOUS

## 2023-06-27 MED ORDER — INSULIN GLARGINE-YFGN 100 UNIT/ML ~~LOC~~ SOLN
20.0000 [IU] | Freq: Every day | SUBCUTANEOUS | Status: DC
  Administered 2023-06-27: 20 [IU] via SUBCUTANEOUS
  Filled 2023-06-27: qty 0.2

## 2023-06-27 MED ORDER — ALPRAZOLAM 0.25 MG PO TABS
0.2500 mg | ORAL_TABLET | Freq: Two times a day (BID) | ORAL | Status: DC | PRN
  Administered 2023-06-27 – 2023-07-02 (×5): 0.25 mg via ORAL
  Filled 2023-06-27 (×5): qty 1

## 2023-06-27 MED ORDER — FUROSEMIDE 10 MG/ML IJ SOLN
80.0000 mg | Freq: Once | INTRAMUSCULAR | Status: AC
  Administered 2023-06-27: 80 mg via INTRAVENOUS
  Filled 2023-06-27: qty 8

## 2023-06-27 MED ORDER — FUROSEMIDE 10 MG/ML IJ SOLN
120.0000 mg | Freq: Three times a day (TID) | INTRAVENOUS | Status: DC
  Administered 2023-06-27 – 2023-06-29 (×6): 120 mg via INTRAVENOUS
  Filled 2023-06-27: qty 10
  Filled 2023-06-27: qty 2
  Filled 2023-06-27: qty 12
  Filled 2023-06-27 (×2): qty 10
  Filled 2023-06-27: qty 12
  Filled 2023-06-27 (×2): qty 10

## 2023-06-27 MED ORDER — POTASSIUM CHLORIDE CRYS ER 20 MEQ PO TBCR
20.0000 meq | EXTENDED_RELEASE_TABLET | Freq: Once | ORAL | Status: AC
  Administered 2023-06-27: 20 meq via ORAL
  Filled 2023-06-27: qty 1

## 2023-06-27 MED ORDER — AMIODARONE LOAD VIA INFUSION
150.0000 mg | Freq: Once | INTRAVENOUS | Status: AC
  Administered 2023-06-27: 150 mg via INTRAVENOUS
  Filled 2023-06-27: qty 83.34

## 2023-06-27 MED ORDER — HEPARIN SODIUM (PORCINE) 1000 UNIT/ML IJ SOLN
INTRAMUSCULAR | Status: AC
  Administered 2023-06-27: 2800 [IU]
  Filled 2023-06-27: qty 3

## 2023-06-27 MED ORDER — AMIODARONE HCL IN DEXTROSE 360-4.14 MG/200ML-% IV SOLN
60.0000 mg/h | INTRAVENOUS | Status: AC
  Administered 2023-06-27 (×2): 60 mg/h via INTRAVENOUS
  Filled 2023-06-27: qty 200

## 2023-06-27 MED ORDER — HALOPERIDOL LACTATE 5 MG/ML IJ SOLN
2.0000 mg | Freq: Four times a day (QID) | INTRAMUSCULAR | Status: DC | PRN
  Administered 2023-06-27: 2 mg via INTRAVENOUS
  Filled 2023-06-27 (×4): qty 1

## 2023-06-27 NOTE — Progress Notes (Signed)
Patient ID: Gabriel Shaw, male   DOB: Nov 11, 1970, 52 y.o.   MRN: [id number]     Advanced Heart Failure Rounding Note  Cardiologist: None   Subjective:    Patient is on milrinone 0.25, NE 2.  UOP 170 over last day, creatinine 2.3 => 3.37 => 4.06 => 5.21.   Tm 100, WBCs 19 => 18.7, PCT 0.25. On ceftriaxone.   No further bleeding at Impella site, hgb 9.7 => 8.6.    He went into atrial fibrillation with RVR this morning, HR 130s.   Impella was pulled back apparently overnight. Echo this am was reviewed: EF 25% with LAD territory WMAs, LV thrombus still present, RV normal, Impella at 4.0 cm but good position.   Feels nauseated still.   Impella CP: P6  Flow 3.0 L/min  Swan: CVP 8-9 PA 22/16 CI 2.4  Co-ox 59% LDH 1261 => 1012   Objective:   Weight Range: 88.3 kg Body mass index is 24.33 kg/m.   Vital Signs:   Temp:  [98.6 F (37 C)-100 F (37.8 C)] 98.6 F (37 C) (12/29 0745) Pulse Rate:  [74-136] 127 (12/29 0745) Resp:  [6-27] 17 (12/29 0745) BP: (70-138)/(50-102) 86/75 (12/29 0745) SpO2:  [74 %-100 %] 97 % (12/29 0745) Weight:  [88.3 kg] 88.3 kg (12/29 0500) Last BM Date :  (PTA)  Weight change: Filed Weights   06/25/23 1830 06/26/23 0500 06/27/23 0500  Weight: (S) 95 kg 95.8 kg 88.3 kg    Intake/Output:   Intake/Output Summary (Last 24 hours) at 06/27/2023 0800 Last data filed at 06/27/2023 0700 Gross per 24 hour  Intake 2438.6 ml  Output 165 ml  Net 2273.6 ml      Physical Exam    General: NAD Neck: JVP 8-9 cm, no thyromegaly or thyroid nodule.  Lungs: Clear to auscultation bilaterally with normal respiratory effort. CV: Nondisplaced PMI.  Heart regular S1/S2, no S3/S4, no murmur.  No peripheral edema.  .  Abdomen: Soft, nontender, no hepatosplenomegaly, no distention.  Skin: Intact without lesions or rashes.  Neurologic: Alert and oriented x 3.  Psych: Normal affect. Extremities: No clubbing or cyanosis.  HEENT: Normal.    Telemetry    Atrial fibrillation 130s (personally reviewed)  Labs    CBC Recent Labs    06/25/23 0822 06/25/23 1023 06/26/23 0355 06/27/23 0427  WBC 19.5*   < > 19.2* 18.2*  NEUTROABS 16.0*  --   --   --   HGB 12.4*   < > 9.7* 8.6*  HCT 37.4*   < > 28.5* 24.5*  MCV 87.2   < > 84.3 82.5  PLT 348   < > 270 193   < > = values in this interval not displayed.   Basic Metabolic Panel Recent Labs    96/29/52 1502 06/27/23 0427  NA 131* 132*  K 3.8 3.9  CL 100 102  CO2 20* 18*  GLUCOSE 295* 288*  BUN 56* 58*  CREATININE 4.06* 5.21*  CALCIUM 8.2* 7.9*   Liver Function Tests Recent Labs    06/26/23 0355 06/27/23 0427  AST 199* 72*  ALT 45* 33  ALKPHOS 46 45  BILITOT 1.3* 0.8  PROT 5.8* 5.6*  ALBUMIN 3.0* 2.8*   No results for input(s): "LIPASE", "AMYLASE" in the last 72 hours. Cardiac Enzymes No results for input(s): "CKTOTAL", "CKMB", "CKMBINDEX", "TROPONINI" in the last 72 hours.  BNP: BNP (last 3 results) No results for input(s): "BNP" in the last 8760 hours.  ProBNP (  last 3 results) No results for input(s): "PROBNP" in the last 8760 hours.   D-Dimer No results for input(s): "DDIMER" in the last 72 hours. Hemoglobin A1C No results for input(s): "HGBA1C" in the last 72 hours. Fasting Lipid Panel Recent Labs    06/25/23 0822  CHOL 120  HDL 52  LDLCALC 52  TRIG 78  CHOLHDL 2.3   Thyroid Function Tests No results for input(s): "TSH", "T4TOTAL", "T3FREE", "THYROIDAB" in the last 72 hours.  Invalid input(s): "FREET3"  Other results:   Imaging    DG CHEST PORT 1 VIEW Result Date: 06/26/2023 CLINICAL DATA:  Chest pain. EXAM: PORTABLE CHEST 1 VIEW COMPARISON:  January 31, 2023. FINDINGS: The heart size and mediastinal contours are within normal limits. Right internal jugular Swan-Ganz catheter is noted with distal tip in inferior portion of right pulmonary artery. Impella device is noted as well. Both lungs are clear. The visualized skeletal structures are  unremarkable. IMPRESSION: Impella device and right internal jugular Swan-Ganz catheter is are noted. No acute pulmonary disease is noted. Electronically Signed   By: Lupita Raider M.D.   On: 06/26/2023 10:47   ECHOCARDIOGRAM LIMITED Result Date: 06/26/2023    ECHOCARDIOGRAM LIMITED REPORT   Patient Name:   VAHIN DISCHER Date of Exam: 06/26/2023 Medical Rec #:  [id number]     Height:       75.0 in Accession #:    [id number]    Weight:       211.2 lb Date of Birth:  January 23, 1971    BSA:          2.246 m Patient Age:    52 years      BP:           109/72 mmHg Patient Gender: M             HR:           112 bpm. Exam Location:  Inpatient Procedure: Limited Echo Indications:    I50.1 Left ventricular failure. Impella placement study.  History:        Patient has prior history of Echocardiogram examinations, most                 recent 06/25/2023. Arrythmias:WPW. Impella present.  Sonographer:    Sheralyn Boatman RDCS Referring Phys: [id number] Dmonte Maher S Suly Vukelich IMPRESSIONS  1. Severe LVE and dysfunction EF 20-25% Large mural apical thrombus 1.9 x 1.1 cm Impella device somewhat deep 5.39 cm RV small with normal function Trivial pericardial effusion anterior to RV.  2. LV thrombus present. FINDINGS  Left Ventricle: LV thrombus present. Additional Comments: Severe LVE and dysfunction EF 20-25% Large mural apical thrombus 1.9 x 1.1 cm Impella device somewhat deep 5.39 cm RV small with normal function Trivial pericardial effusion anterior to RV.  Charlton Haws MD Electronically signed by Charlton Haws MD Signature Date/Time: 06/26/2023/9:29:48 AM    Final      Medications:     Scheduled Medications:  amiodarone  150 mg Intravenous Once   aspirin EC  81 mg Oral Daily   atorvastatin  80 mg Oral Daily   Chlorhexidine Gluconate Cloth  6 each Topical Daily   feeding supplement  237 mL Oral BID BM   insulin aspart  0-15 Units Subcutaneous TID WC   insulin aspart  0-5 Units Subcutaneous QHS   insulin glargine-yfgn  20 Units  Subcutaneous Daily   polyethylene glycol  17 g Oral Daily   senna-docusate  1 tablet Oral BID  sodium chloride flush  3 mL Intravenous Q12H    Infusions:  amiodarone     Followed by   amiodarone     cefTRIAXone (ROCEPHIN)  IV 2 g (06/26/23 0816)   heparin 1,100 Units/hr (06/27/23 0700)   milrinone 0.25 mcg/kg/min (06/26/23 2342)   norepinephrine (LEVOPHED) Adult infusion 2 mcg/min (06/26/23 2220)   promethazine (PHENERGAN) injection (IM or IVPB) 12.5 mg (06/27/23 0256)   sodium bicarbonate 25 mEq (Impella PURGE) in dextrose 5 % 1000 mL bag      PRN Medications: acetaminophen, ALPRAZolam, ondansetron (ZOFRAN) IV, mouth rinse, promethazine (PHENERGAN) injection (IM or IVPB), sodium chloride flush    Assessment/Plan   1. CAD: Late presenting anterior/inferior STEMI/CAD.  70% mid LAD, 100% m-dLAD, 90% pRCA, 90% mRCA, 70% LPAV.  Disease not amenable to PCI, failed attempt to cross LAD occlusion x 3.  No chest pain. Medical management.  - ASA + statin   - no ? blocker w/ shock  2. Acute Systolic Heart Failure/Cardiogenic shock: Ischemic cardiomyopathy, limited echo today with EF 25% with LAD territory WMAs, LV thrombus, RV normal. Impella at 4.0 cm in good position. Worry that he has nonviable LAD-territory myocardium. Initial RHC with mean RA 6, mean PCWP 20, CI 1.93.  Impella CP placed. On milrinone 0.25 and NE 2. Minimal UOP, CVP 8-9 this morning with CI 2.4, co-ox 59%.  Creatinine continuing to rise with suspected ATN, up to 5.21.  - Continue milrinone 0.25, would continue NE 2 to keep MAP stable.  - Impella increased back to P7 to continue aggressive support while we wait to see if renal function recovers.  - On heparin gtt with Impella and LV thrombus.  - If we cannot wean off Impella, will need to consider LVAD but AKI and Jehovah's Witness could make this difficult. 3. AKI: SCr 1.3 => 2.42 => 3.37 => 5.21. Only 170 cc UOP over last day. MAP stable. Suspect ATN in setting of  cardiogenic shock.  BUN not markedly high and not uremic.  I do not think CVVH is urgent at this point but worried that he is heading in that direction.  - Lasix 80 mg IV bolus.  If no response, will go ahead and place HD catheter in anticipation of CVVH.  - Maintain CO and MAP.  4. Elevated LFTs: Suspect shock liver, trending down.  5. ID: Tm 100 with WBCs 19.5 => 18.  PCT 0.25.Marland Kitchen  MRSA PCR negative.  - Covering with ceftriaxone empirically. .  6. Type 2DM: SSI 7. Anemia: Blood loss at Impella site initially, seems to have stopped.  Hgb 9.7 => 8.6 today.  Jehovah's Witness so no blood products.  - he would NOT accept any blood products even in a life and death situation.  8. LV thrombus: Has been on HCO3 purge, heparin started.  9. Atrial fibrillation: With RVR, began this morning.  - Start amiodarone gtt.  - Continue heparin gtt.   CRITICAL CARE Performed by: Marca Ancona  Total critical care time: 45 minutes  Critical care time was exclusive of separately billable procedures and treating other patients.  Critical care was necessary to treat or prevent imminent or life-threatening deterioration.  Critical care was time spent personally by me on the following activities: development of treatment plan with patient and/or surrogate as well as nursing, discussions with consultants, evaluation of patient's response to treatment, examination of patient, obtaining history from patient or surrogate, ordering and performing treatments and interventions, ordering and review of laboratory studies,  ordering and review of radiographic studies, pulse oximetry and re-evaluation of patient's condition.   Length of Stay: 2  Marca Ancona, MD  06/27/2023, 8:00 AM  Advanced Heart Failure Team Pager [phone] (M-F; 7a - 5p)  Please contact [facility] for night-coverage after hours (5p -7a ) and weekends on [email or web address]

## 2023-06-27 NOTE — Procedures (Signed)
Central Venous Catheter Insertion Procedure Note  Gabriel Shaw  [id number]  11-22-1970  Date:06/27/23  Time:11:24 AM   Provider Performing:Ginette Bradway Salena Saner Katrinka Blazing   Procedure: Insertion of Non-tunneled Central Venous Catheter(36556)with US guidance (40981)    Indication(s) Hemodialysis  Consent Risks of the procedure as well as the alternatives and risks of each were explained to the patient and/or caregiver.  Consent for the procedure was obtained and is signed in the bedside chart  Anesthesia Topical only with 1% lidocaine   Timeout Verified patient identification, verified procedure, site/side was marked, verified correct patient position, special equipment/implants available, medications/allergies/relevant history reviewed, required imaging and test results available.  Sterile Technique Maximal sterile technique including full sterile barrier drape, hand hygiene, sterile gown, sterile gloves, mask, hair covering, sterile ultrasound probe cover (if used).  Procedure Description Area of catheter insertion was cleaned with chlorhexidine and draped in sterile fashion.   With real-time ultrasound guidance a HD catheter was placed into the left internal jugular vein.  Nonpulsatile blood flow and easy flushing noted in all ports.  The catheter was sutured in place and sterile dressing applied.  Complications/Tolerance None; patient tolerated the procedure well. Chest X-ray is ordered to verify placement for internal jugular or subclavian cannulation.  Chest x-ray is not ordered for femoral cannulation.  EBL Minimal  Specimen(s) None

## 2023-06-27 NOTE — Progress Notes (Signed)
PHARMACY - ANTICOAGULATION CONSULT NOTE  Pharmacy Consult for heparin Indication:  Impella CP/apical thrombus  No Known Allergies  Patient Measurements: Height: 6\' 3"  (190.5 cm) Weight: 88.3 kg (194 lb 10.7 oz) IBW/kg (Calculated) : 84.5 Heparin Dosing Weight: 107 kg  Vital Signs: Temp: 98.8 F (37.1 C) (12/29 1615) Temp Source: Core (12/29 1600) BP: 92/70 (12/29 1615) Pulse Rate: 103 (12/29 1615)  Labs: Recent Labs    06/25/23 0822 06/25/23 1023 06/26/23 0355 06/26/23 0430 06/26/23 1502 06/26/23 2223 06/27/23 0427 06/27/23 0622 06/27/23 1537  HGB 12.4*   < > 9.7*  --   --   --  8.6*  --  8.5*  HCT 37.4*   < > 28.5*  --   --   --  24.5*  --  24.1*  PLT 348   < > 270  --   --   --  193  --  195  APTT 31  --   --   --   --   --   --   --   --   LABPROT 14.6  --   --   --   --   --   --   --   --   INR 1.1  --   --   --   --   --   --   --   --   HEPARINUNFRC  --    < >  --    < > <0.10* <0.10*  --  <0.10* 0.10*  CREATININE 2.42*   < > 3.37*  --  4.06*  --  5.21*  --  5.87*  TROPONINIHS >24,000*  --   --   --   --   --   --   --   --    < > = values in this interval not displayed.    Estimated Creatinine Clearance: 17.6 mL/min (A) (by C-G formula based on SCr of 5.87 mg/dL (H)).   Medical History: Past Medical History:  Diagnosis Date   Depression 1989   Diabetes mellitus without complication (HCC)    Gastroesophageal reflux disease 01/31/2014   HLD (hyperlipidemia) 03/12/2023   Hypertension    ST elevation myocardial infarction (STEMI) (HCC) 06/25/2023   Ulcer 2016     Assessment: 23 yoM admitted as late presenting STEMI s/p unsuccessful PCI and Impella CP placement. ECHO showing large apical thrombus. Pharmacy to manage heparin. Bicarbonate purge started. No AC PTA. Patient is Jehovah's Witness.   In Afib starting 12/29 morning> resolved by afternoon. Heparin level 0.1 is subtherapeutic on 1300 units/hr. Hgb and platelets stable.  No issues with infusion or  bleeding per RN.   Goal of Therapy:  Heparin level 0.3-0.5 units/ml Monitor platelets by anticoagulation protocol: Yes   Plan:  Increase heparin to 1450 units/hr Heparin level in 8 hours Monitor daily heparin level, CBC, signs/symptoms of bleeding   Thank you for allowing pharmacy to participate in this patient's care,  Alphia Moh, PharmD, BCPS, BCCP Clinical Pharmacist  Please check AMION for all [facility] Pharmacy phone numbers After 10:00 PM, call Main Pharmacy [phone]

## 2023-06-27 NOTE — Consult Note (Signed)
[facility] Renal Consultation Note  Requesting MD: Shirlee Latch Indication for Consultation: A on crf in the setting of MI/cardiogenic shock  HPI:  Gabriel Shaw is a 52 y.o. male with obesity, hypertension, diabetes mellitus, is also a Jehovah's Witness.  He presented to the ER 2 days after onset of chest pain.  Evaluation felt evolving MI, went to Cath Lab showing multi vessel coronary artery disease -unable to intervene.  Also with decreased ejection fraction where Impella was placed and patient started on pressors and admitted to ICU.  Patient is continue to suffer from cardiogenic shock, started on milrinone and heparin.  Regarding kidneys, does appear to have evidence of prior CKD with mostly creatinine around 1.2-1.5.  Was 1.31 on 10//24.  Upon presentation creatinine 2.42 but has worsened steadily since and has had a level of 5.2 today.  Urine output has not been robust and has decreased the last 24 hours.  Did not respond to Lasix challenge.  Therefore, we are asked to assist in management of acute on chronic renal failure.  Patient just reports being fatigued.  Does not have current nausea but does have what sounds like hiatal hernia which gives intermittent nausea.  He is also not complaining of shortness of breath.  Oxygen saturation is okay on room air.  CVP has been as high as 15 but does not have peripheral edema.  Creatinine  Date/Time Value Ref Range Status  12/23/2013 05:21 AM 1.36 (H) 0.60 - 1.30 mg/dL Final  40/98/1191 47:82 PM 2.24 (H) 0.60 - 1.30 mg/dL Final  95/62/1308 65:78 AM 1.07 0.60 - 1.30 mg/dL Final   Creatinine, Ser  Date/Time Value Ref Range Status  06/27/2023 04:27 AM 5.21 (H) 0.61 - 1.24 mg/dL Final  46/96/2952 84:13 PM 4.06 (H) 0.61 - 1.24 mg/dL Final  24/40/1027 25:36 AM 3.37 (H) 0.61 - 1.24 mg/dL Final  64/40/3474 25:95 PM 2.30 (H) 0.61 - 1.24 mg/dL Final  63/87/5643 32:95 AM 2.42 (H) 0.61 - 1.24 mg/dL Final  18/84/1660 63:01 AM 1.31 (H) 0.76 -  1.27 mg/dL Final  60/03/9322 55:73 AM 1.19 0.76 - 1.27 mg/dL Final  22/07/5425 06:23 AM 1.96 (H) 0.61 - 1.24 mg/dL Final  76/28/3151 76:16 PM 1.27 (H) 0.61 - 1.24 mg/dL Final  07/37/1062 69:48 PM 1.39 (H) 0.61 - 1.24 mg/dL Final  54/62/7035 00:93 AM 1.48 (H) 0.61 - 1.24 mg/dL Final  81/82/9937 16:96 PM 1.27 (H) 0.61 - 1.24 mg/dL Final  78/93/8101 75:10 PM 1.43 (H) 0.61 - 1.24 mg/dL Final     PMHx:   Past Medical History:  Diagnosis Date   Depression 1989   Diabetes mellitus without complication (HCC)    Gastroesophageal reflux disease 01/31/2014   HLD (hyperlipidemia) 03/12/2023   Hypertension    ST elevation myocardial infarction (STEMI) (HCC) 06/25/2023   Ulcer 2016    Past Surgical History:  Procedure Laterality Date   CORONARY/GRAFT ACUTE MI REVASCULARIZATION N/A 06/25/2023   Procedure: Coronary/Graft Acute MI Revascularization;  Surgeon: Iran Ouch, MD;  Location: [facility] INVASIVE CV LAB;  Service: Cardiovascular;  Laterality: N/A;   EYE SURGERY  03/02/2023   LEFT HEART CATH AND CORONARY ANGIOGRAPHY N/A 06/25/2023   Procedure: LEFT HEART CATH AND CORONARY ANGIOGRAPHY;  Surgeon: Iran Ouch, MD;  Location: [facility] INVASIVE CV LAB;  Service: Cardiovascular;  Laterality: N/A;   RIGHT HEART CATH N/A 06/25/2023   Procedure: RIGHT HEART CATH;  Surgeon: Iran Ouch, MD;  Location: [facility] INVASIVE CV LAB;  Service: Cardiovascular;  Laterality:  N/A;   VENTRICULAR ASSIST DEVICE INSERTION N/A 06/25/2023   Procedure: VENTRICULAR ASSIST DEVICE INSERTION;  Surgeon: Iran Ouch, MD;  Location: [facility] INVASIVE CV LAB;  Service: Cardiovascular;  Laterality: N/A;    Family Hx:  Family History  Problem Relation Age of Onset   Diabetes Mother    Vision loss Mother    Cancer Maternal Uncle    Cancer Maternal Grandmother    Diabetes Maternal Grandmother     Social History:  reports that he quit smoking about 6 years ago. His smoking use included cigarettes, pipe, cigars, and  e-cigarettes. He has a 0.3 pack-year smoking history. He has never used smokeless tobacco. He reports that he does not currently use alcohol. He reports that he does not currently use drugs after having used the following drugs: Cocaine, LSD, and Marijuana.  Allergies: No Known Allergies  Medications: Prior to Admission medications   Medication Sig Start Date End Date Taking? Authorizing Provider  atorvastatin (LIPITOR) 40 MG tablet Take 1 tablet (40 mg total) by mouth daily. 03/18/23 03/17/24 Yes Jackolyn Confer, MD  Glucagon 1 MG/0.2ML SOLN Inject 1 mg subcutaneously for severe hypoglycemia; if no response is observed within 15 minutes, an additional 1 mg dose may be administered.  Call 911. 06/16/23  Yes Jackolyn Confer, MD  insulin aspart (NOVOLOG) 100 unit/mL injection Inject 10 Units into the skin 3 (three) times daily before meals.   Yes [provider]  LANTUS SOLOSTAR 100 UNIT/ML Solostar Pen Inject 30 Units into the skin daily. 10/20/22  Yes [provider]  metFORMIN (GLUCOPHAGE) 500 MG tablet Take 2 tablets (1,000 mg total) by mouth 2 (two) times daily with a meal. 04/02/23 04/01/24 Yes Jackolyn Confer, MD  OZEMPIC, 1 MG/DOSE, 4 MG/3ML SOPN INJECT 1 MG UNDER THE SKIN ONCE WEEKLY AS DIRECTED 06/17/23  Yes Jackolyn Confer, MD  Accu-Chek Softclix Lancets lancets Use to check blood glucose daily 06/16/23   Jackolyn Confer, MD  aspirin 81 MG chewable tablet Chew 81 mg by mouth daily. Patient not taking: Reported on 06/25/2023 09/23/22   [provider]  Blood Glucose Monitoring Suppl (ACCU-CHEK AVIVA PLUS) w/Device KIT Use to check blood glucose daily 06/16/23   Jackolyn Confer, MD  Continuous Glucose Receiver (DEXCOM G6 RECEIVER) DEVI Use to monitor blood glucose 04/06/23   Jackolyn Confer, MD  Continuous Glucose Sensor (DEXCOM G6 SENSOR) MISC Change sensor every 10 days 05/18/23   Jackolyn Confer, MD  Continuous Glucose Transmitter (DEXCOM G6 TRANSMITTER) MISC Change  transmitter every 90 days 04/06/23   Jackolyn Confer, MD  glucose blood (ACCU-CHEK AVIVA PLUS) test strip Use to check blood glucose daily 06/16/23   Jackolyn Confer, MD    I have reviewed the patient's current medications.  Labs:  Results for orders placed or performed during the hospital encounter of 06/25/23 (from the past 48 hours)  CBC     Status: Abnormal   Collection Time: 06/25/23  1:20 PM  Result Value Ref Range   WBC 19.3 (H) 4.0 - 10.5 K/uL   RBC 3.90 (L) 4.22 - 5.81 MIL/uL   Hemoglobin 11.3 (L) 13.0 - 17.0 g/dL   HCT 16.1 (L) 09.6 - 04.5 %   MCV 84.9 80.0 - 100.0 fL   MCH 29.0 26.0 - 34.0 pg   MCHC 34.1 30.0 - 36.0 g/dL   RDW 40.9 81.1 - 91.4 %   Platelets 336 150 - 400 K/uL   nRBC 0.0  0.0 - 0.2 %    Comment: Performed at [facility] Lab, [address] [place], [place] [address]  POCT Activated clotting time     Status: None   Collection Time: 06/25/23  1:55 PM  Result Value Ref Range   Activated Clotting Time 0 seconds    Comment: Reference range 74-137 seconds for patients not on anticoagulant therapy.  POCT Activated clotting time     Status: None   Collection Time: 06/25/23  2:02 PM  Result Value Ref Range   Activated Clotting Time 147 seconds    Comment: Reference range 74-137 seconds for patients not on anticoagulant therapy.  Glucose, capillary     Status: Abnormal   Collection Time: 06/25/23  3:52 PM  Result Value Ref Range   Glucose-Capillary 280 (H) 70 - 99 mg/dL    Comment: Glucose reference range applies only to samples taken after fasting for at least 8 hours.  HIV Antibody (routine testing w rflx)     Status: None   Collection Time: 06/25/23  4:55 PM  Result Value Ref Range   HIV Screen 4th Generation wRfx Non Reactive Non Reactive    Comment: Performed at [facility], [address] [place], [place] [address]  MRSA Next Gen by PCR, Nasal     Status: None   Collection Time: 06/25/23  4:55 PM   Specimen: Nasal Mucosa; Nasal Swab  Result  Value Ref Range   MRSA by PCR Next Gen NOT DETECTED NOT DETECTED    Comment: (NOTE) The GeneXpert MRSA Assay (FDA approved for NASAL specimens only), is one component of a comprehensive MRSA colonization surveillance program. It is not intended to diagnose MRSA infection nor to guide or monitor treatment for MRSA infections. Test performance is not FDA approved in patients less than 69 years old. Performed at [facility], [address], [place], [place] [address]   Glucose, capillary     Status: Abnormal   Collection Time: 06/25/23  9:05 PM  Result Value Ref Range   Glucose-Capillary 307 (H) 70 - 99 mg/dL    Comment: Glucose reference range applies only to samples taken after fasting for at least 8 hours.  Heparin level (unfractionated)     Status: Abnormal   Collection Time: 06/25/23 10:01 PM  Result Value Ref Range   Heparin Unfractionated <0.10 (L) 0.30 - 0.70 IU/mL    Comment: (NOTE) The clinical reportable range upper limit is being lowered to >1.10 to align with the FDA approved guidance for the current laboratory assay.  If heparin results are below expected values, and patient dosage has  been confirmed, suggest follow up testing of antithrombin III levels. Performed at [facility], [address], [place], [place] [address]   CBC     Status: Abnormal   Collection Time: 06/26/23  3:55 AM  Result Value Ref Range   WBC 19.2 (H) 4.0 - 10.5 K/uL   RBC 3.38 (L) 4.22 - 5.81 MIL/uL   Hemoglobin 9.7 (L) 13.0 - 17.0 g/dL   HCT 86.5 (L) 78.4 - 69.6 %   MCV 84.3 80.0 - 100.0 fL   MCH 28.7 26.0 - 34.0 pg   MCHC 34.0 30.0 - 36.0 g/dL   RDW 29.5 28.4 - 13.2 %   Platelets 270 150 - 400 K/uL   nRBC 0.0 0.0 - 0.2 %    Comment: Performed at [facility], [address] [place], [place] [address]  Comprehensive  metabolic panel     Status: Abnormal   Collection Time: 06/26/23  3:55 AM  Result Value Ref Range   Sodium 134 (L) 135 - 145 mmol/L   Potassium 3.7  3.5 - 5.1 mmol/L   Chloride 104 98 - 111 mmol/L   CO2 20 (L) 22 - 32 mmol/L   Glucose, Bld 319 (H) 70 - 99 mg/dL    Comment: Glucose reference range applies only to samples taken after fasting for at least 8 hours.   BUN 47 (H) 6 - 20 mg/dL   Creatinine, Ser 3.08 (H) 0.61 - 1.24 mg/dL   Calcium 8.4 (L) 8.9 - 10.3 mg/dL   Total Protein 5.8 (L) 6.5 - 8.1 g/dL   Albumin 3.0 (L) 3.5 - 5.0 g/dL   AST 657 (H) 15 - 41 U/L   ALT 45 (H) 0 - 44 U/L   Alkaline Phosphatase 46 38 - 126 U/L   Total Bilirubin 1.3 (H) <1.2 mg/dL   GFR, Estimated 21 (L) >60 mL/min    Comment: (NOTE) Calculated using the CKD-EPI Creatinine Equation (2021)    Anion gap 10 5 - 15    Comment: Performed at [facility], [address] [place], [place] [address]  Cooxemetry Panel (carboxy, met, total hgb, O2 sat)     Status: Abnormal   Collection Time: 06/26/23  4:01 AM  Result Value Ref Range   Total hemoglobin 10.0 (L) 12.0 - 16.0 g/dL   O2 Saturation 60 %   Carboxyhemoglobin 1.5 0.5 - 1.5 %   Methemoglobin <0.7 0.0 - 1.5 %    Comment: Performed at [facility], [address], [place], [place] [address]  Heparin level (unfractionated)     Status: Abnormal   Collection Time: 06/26/23  4:30 AM  Result Value Ref Range   Heparin Unfractionated <0.10 (L) 0.30 - 0.70 IU/mL    Comment: (NOTE) The clinical reportable range upper limit is being lowered to >1.10 to align with the FDA approved guidance for the current laboratory assay.  If heparin results are below expected values, and patient dosage has  been confirmed, suggest follow up testing of antithrombin III levels. Performed at [facility], [address], [place], [place] [address]   Glucose, capillary     Status: Abnormal   Collection Time: 06/26/23  6:42 AM  Result Value Ref Range   Glucose-Capillary 283 (H) 70 - 99 mg/dL    Comment: Glucose reference range applies only to samples taken after fasting for at least 8 hours.  Lactate  dehydrogenase     Status: Abnormal   Collection Time: 06/26/23  7:51 AM  Result Value Ref Range   LDH 1,261 (H) 98 - 192 U/L    Comment: Performed at [facility], [address] [place], [place] [address]  Procalcitonin     Status: None   Collection Time: 06/26/23  7:51 AM  Result Value Ref Range   Procalcitonin 0.25 ng/mL    Comment:        Interpretation: PCT (Procalcitonin) <= 0.5 ng/mL: Systemic infection (sepsis) is not likely. Local bacterial infection is possible. (NOTE)       Sepsis PCT Algorithm           Lower Respiratory Tract  Infection PCT Algorithm    ----------------------------     ----------------------------         PCT < 0.25 ng/mL                PCT < 0.10 ng/mL          Strongly encourage             Strongly discourage   discontinuation of antibiotics    initiation of antibiotics    ----------------------------     -----------------------------       PCT 0.25 - 0.50 ng/mL            PCT 0.10 - 0.25 ng/mL               OR       >80% decrease in PCT            Discourage initiation of                                            antibiotics      Encourage discontinuation           of antibiotics    ----------------------------     -----------------------------         PCT >= 0.50 ng/mL              PCT 0.26 - 0.50 ng/mL               AND        <80% decrease in PCT             Encourage initiation of                                             antibiotics       Encourage continuation           of antibiotics    ----------------------------     -----------------------------        PCT >= 0.50 ng/mL                  PCT > 0.50 ng/mL               AND         increase in PCT                  Strongly encourage                                      initiation of antibiotics    Strongly encourage escalation           of antibiotics                                     -----------------------------                                            PCT <= 0.25 ng/mL  OR                                        > 80% decrease in PCT                                      Discontinue / Do not initiate                                             antibiotics  Performed at [facility], [address], [place], [place] [address]   Cooxemetry Panel (carboxy, met, total hgb, O2 sat)     Status: Abnormal   Collection Time: 06/26/23  8:21 AM  Result Value Ref Range   Total hemoglobin 9.6 (L) 12.0 - 16.0 g/dL   O2 Saturation 60.4 %   Carboxyhemoglobin 1.4 0.5 - 1.5 %   Methemoglobin <0.7 0.0 - 1.5 %    Comment: Performed at [facility], [address], [place], [place] [address]  Culture, blood (Routine X 2) w Reflex to ID Panel     Status: None (Preliminary result)   Collection Time: 06/26/23  9:47 AM   Specimen: BLOOD LEFT ARM  Result Value Ref Range   Specimen Description BLOOD LEFT ARM    Special Requests      BOTTLES DRAWN AEROBIC AND ANAEROBIC Blood Culture results may not be optimal due to an inadequate volume of blood received in culture bottles   Culture      NO GROWTH < 24 HOURS Performed at [facility], [address] [place], [place] [address]    Report Status PENDING   Culture, blood (Routine X 2) w Reflex to ID Panel     Status: None (Preliminary result)   Collection Time: 06/26/23  9:48 AM   Specimen: BLOOD RIGHT ARM  Result Value Ref Range   Specimen Description BLOOD RIGHT ARM    Special Requests      BOTTLES DRAWN AEROBIC AND ANAEROBIC Blood Culture results may not be optimal due to an inadequate volume of blood received in culture bottles   Culture      NO GROWTH < 24 HOURS Performed at [facility], [address] [place], [place] [address]    Report Status PENDING   Glucose, capillary     Status: Abnormal   Collection Time: 06/26/23 11:49 AM  Result Value Ref Range   Glucose-Capillary 271 (H) 70 - 99 mg/dL     Comment: Glucose reference range applies only to samples taken after fasting for at least 8 hours.  Heparin level (unfractionated)     Status: Abnormal   Collection Time: 06/26/23  3:02 PM  Result Value Ref Range   Heparin Unfractionated <0.10 (L) 0.30 - 0.70 IU/mL    Comment: (NOTE) The clinical reportable range upper limit is being lowered to >1.10 to align with the FDA approved guidance for the current laboratory assay.  If heparin results are below expected values, and patient dosage has  been confirmed, suggest follow up testing of antithrombin III levels. Performed at [facility], [address], [place], [place] [address]   Basic metabolic panel  Status: Abnormal   Collection Time: 06/26/23  3:02 PM  Result Value Ref Range   Sodium 131 (L) 135 - 145 mmol/L   Potassium 3.8 3.5 - 5.1 mmol/L   Chloride 100 98 - 111 mmol/L   CO2 20 (L) 22 - 32 mmol/L   Glucose, Bld 295 (H) 70 - 99 mg/dL    Comment: Glucose reference range applies only to samples taken after fasting for at least 8 hours.   BUN 56 (H) 6 - 20 mg/dL   Creatinine, Ser 1.61 (H) 0.61 - 1.24 mg/dL   Calcium 8.2 (L) 8.9 - 10.3 mg/dL   GFR, Estimated 17 (L) >60 mL/min    Comment: (NOTE) Calculated using the CKD-EPI Creatinine Equation (2021)    Anion gap 11 5 - 15    Comment: Performed at [facility], [address], [place], [place] [address]  Glucose, capillary     Status: Abnormal   Collection Time: 06/26/23  4:36 PM  Result Value Ref Range   Glucose-Capillary 288 (H) 70 - 99 mg/dL    Comment: Glucose reference range applies only to samples taken after fasting for at least 8 hours.  Glucose, capillary     Status: Abnormal   Collection Time: 06/26/23  9:08 PM  Result Value Ref Range   Glucose-Capillary 273 (H) 70 - 99 mg/dL    Comment: Glucose reference range applies only to samples taken after fasting for at least 8 hours.  Heparin level (unfractionated)     Status: Abnormal   Collection Time:  06/26/23 10:23 PM  Result Value Ref Range   Heparin Unfractionated <0.10 (L) 0.30 - 0.70 IU/mL    Comment: (NOTE) The clinical reportable range upper limit is being lowered to >1.10 to align with the FDA approved guidance for the current laboratory assay.  If heparin results are below expected values, and patient dosage has  been confirmed, suggest follow up testing of antithrombin III levels. Performed at [facility], [address], [place], [place] [address]   CBC     Status: Abnormal   Collection Time: 06/27/23  4:27 AM  Result Value Ref Range   WBC 18.2 (H) 4.0 - 10.5 K/uL   RBC 2.97 (L) 4.22 - 5.81 MIL/uL   Hemoglobin 8.6 (L) 13.0 - 17.0 g/dL   HCT 11.9 (L) 14.7 - 82.9 %   MCV 82.5 80.0 - 100.0 fL   MCH 29.0 26.0 - 34.0 pg   MCHC 35.1 30.0 - 36.0 g/dL   RDW 56.2 13.0 - 86.5 %   Platelets 193 150 - 400 K/uL   nRBC 0.0 0.0 - 0.2 %    Comment: Performed at [facility], [address], [place], [place] [address]  Comprehensive metabolic panel     Status: Abnormal   Collection Time: 06/27/23  4:27 AM  Result Value Ref Range   Sodium 132 (L) 135 - 145 mmol/L   Potassium 3.9 3.5 - 5.1 mmol/L   Chloride 102 98 - 111 mmol/L   CO2 18 (L) 22 - 32 mmol/L   Glucose, Bld 288 (H) 70 - 99 mg/dL    Comment: Glucose reference range applies only to samples taken after fasting for at least 8 hours.   BUN 58 (H) 6 - 20 mg/dL   Creatinine, Ser 6.29 (H) 0.61 - 1.24 mg/dL   Calcium 7.9 (L) 8.9 - 10.3 mg/dL   Total Protein 5.6 (L) 6.5 - 8.1 g/dL   Albumin 2.8 (  L) 3.5 - 5.0 g/dL   AST 72 (H) 15 - 41 U/L   ALT 33 0 - 44 U/L   Alkaline Phosphatase 45 38 - 126 U/L   Total Bilirubin 0.8 <1.2 mg/dL   GFR, Estimated 12 (L) >60 mL/min    Comment: (NOTE) Calculated using the CKD-EPI Creatinine Equation (2021)    Anion gap 12 5 - 15    Comment: Performed at [facility], [address] [place], [place] [address]  Lactate dehydrogenase     Status: Abnormal   Collection  Time: 06/27/23  4:27 AM  Result Value Ref Range   LDH 1,012 (H) 98 - 192 U/L    Comment: Performed at [facility], [address] [place], [place] [address]  Cooxemetry Panel (carboxy, met, total hgb, O2 sat)     Status: Abnormal   Collection Time: 06/27/23  4:32 AM  Result Value Ref Range   Total hemoglobin 9.0 (L) 12.0 - 16.0 g/dL   O2 Saturation 27.0 %   Carboxyhemoglobin 1.5 0.5 - 1.5 %   Methemoglobin <0.7 0.0 - 1.5 %    Comment: Performed at [facility], [address], [place], [place] [address]  Heparin level (unfractionated)     Status: Abnormal   Collection Time: 06/27/23  6:22 AM  Result Value Ref Range   Heparin Unfractionated <0.10 (L) 0.30 - 0.70 IU/mL    Comment: (NOTE) The clinical reportable range upper limit is being lowered to >1.10 to align with the FDA approved guidance for the current laboratory assay.  If heparin results are below expected values, and patient dosage has  been confirmed, suggest follow up testing of antithrombin III levels. Performed at [facility], [address], [place], [place] [address]   Glucose, capillary     Status: Abnormal   Collection Time: 06/27/23  8:21 AM  Result Value Ref Range   Glucose-Capillary 344 (H) 70 - 99 mg/dL    Comment: Glucose reference range applies only to samples taken after fasting for at least 8 hours.  Glucose, capillary     Status: Abnormal   Collection Time: 06/27/23 11:59 AM  Result Value Ref Range   Glucose-Capillary 318 (H) 70 - 99 mg/dL    Comment: Glucose reference range applies only to samples taken after fasting for at least 8 hours.     ROS:  A comprehensive review of systems was negative except for: Constitutional: positive for fatigue Gastrointestinal: positive for reflux symptoms  Physical Exam: Vitals:   06/27/23 1137 06/27/23 1215  BP: 103/72 99/72  Pulse: (!) 108 (!) 106  Resp: 18 17  Temp: 98.6 F (37 C) 98.4 F (36.9 C)  SpO2: 99% 98%     General:  Fairly well-appearing black male fatigued but no acute distress HEENT: PERRLA, EOMI, mucous membranes moist  Neck: Difficult to tell JVD Heart: Tachycardic Lungs: Mostly clear bilaterally Abdomen: Obese, distended, nontender Extremities: Really minimal peripheral edema Skin: Warm and dry Neuro: Fatigued but alert and oriented  Assessment/Plan: 52 year old black male with hypertension, diabetes, and mild CKD.  He now presents late after MI and is suffering from cardiogenic shock complicated by acute renal failure 1.Renal-does have some mild CKD at baseline.  Also at least 1 episode in the past of AKI.  Urinalyses in the past have shown proteinuria-also for some reason had 1 done on 12/13 that showed some microscopic hematuria..  Will check urine here today for completeness sake but suspect  that major etiology of his acute kidney injury is hemodynamic in nature in the setting of cardiogenic shock.  Has an Impella in place and is on norepinephrine and milrinone to reinforce blood pressure and kidney perfusion.  There are no absolute needs for dialysis support at this time but I agree does not look good and urine output is dwindling.  Will try a little bit longer with higher dose Lasix challenge to see if we can augment urine output some in the next 24 hours.  CCM has kindly placed a hemodialysis access for [place] so that we could institute CRRT at any time.  Will reevaluate in the morning for need 2. Hypertension/volume  -is hypotensive and slightly volume overloaded.  Medical management with milrinone and pressor support.  Lasix challenge as above 3.  Cardiovascular-significant issue with multivessel CAD that was unable to be intervened upon.  Continued cardiac dysfunction with Impella, milrinone and norepinephrine.  Per cardiology 4. Anemia  -supportive care at this time   Cecille Aver 06/27/2023, 12:27 PM

## 2023-06-27 NOTE — Plan of Care (Signed)
°  Problem: Clinical Measurements: Goal: Ability to maintain clinical measurements within normal limits will improve Outcome: Progressing   Problem: Coping: Goal: Level of anxiety will decrease Outcome: Progressing   Problem: Skin Integrity: Goal: Risk for impaired skin integrity will decrease Outcome: Progressing   Problem: Coping: Goal: Ability to adjust to condition or change in health will improve Outcome: Progressing

## 2023-06-27 NOTE — Progress Notes (Signed)
PHARMACY - ANTICOAGULATION CONSULT NOTE  Pharmacy Consult for heparin Indication:  Impella CP/apical thrombus  No Known Allergies  Patient Measurements: Height: 6\' 3"  (190.5 cm) Weight: 88.3 kg (194 lb 10.7 oz) IBW/kg (Calculated) : 84.5 Heparin Dosing Weight: 107 kg  Vital Signs: Temp: 98.8 F (37.1 C) (12/29 0730) Temp Source: Core (12/29 0400) BP: 84/68 (12/29 0730) Pulse Rate: 113 (12/29 0730)  Labs: Recent Labs    06/25/23 0822 06/25/23 1023 06/25/23 1320 06/25/23 2201 06/26/23 0355 06/26/23 0430 06/26/23 1502 06/26/23 2223 06/27/23 0427 06/27/23 0622  HGB 12.4*   < > 11.3*  --  9.7*  --   --   --  8.6*  --   HCT 37.4*   < > 33.1*  --  28.5*  --   --   --  24.5*  --   PLT 348  --  336  --  270  --   --   --  193  --   APTT 31  --   --   --   --   --   --   --   --   --   LABPROT 14.6  --   --   --   --   --   --   --   --   --   INR 1.1  --   --   --   --   --   --   --   --   --   HEPARINUNFRC  --   --   --    < >  --    < > <0.10* <0.10*  --  <0.10*  CREATININE 2.42*   < >  --   --  3.37*  --  4.06*  --  5.21*  --   TROPONINIHS >24,000*  --   --   --   --   --   --   --   --   --    < > = values in this interval not displayed.    Estimated Creatinine Clearance: 19.8 mL/min (A) (by C-G formula based on SCr of 5.21 mg/dL (H)).   Medical History: Past Medical History:  Diagnosis Date   Depression 1989   Diabetes mellitus without complication (HCC)    Gastroesophageal reflux disease 01/31/2014   HLD (hyperlipidemia) 03/12/2023   Hypertension    ST elevation myocardial infarction (STEMI) (HCC) 06/25/2023   Ulcer 2016     Assessment: 45 yoM admitted as late presenting STEMI s/p unsuccessful PCI and Impella CP placement. ECHO showing large apical thrombus. Pharmacy to manage heparin. Bicarbonate purge started. No AC PTA.  Heparin level is undetectable (<0.1), on heparin infusion at 1100 units/hr. Hgb down slightly to 8.6, plt 193. LDH 1012. Now in Afib  starting this morning. No s/sx of bleeding or infusion issues.   Goal of Therapy:  Heparin level 0.3-0.5 units/ml Monitor platelets by anticoagulation protocol: Yes   Plan:  Increase heparin to 1300 units/hr Heparin level in 8 hours Monitor daily heparin level, CBC, signs/symptoms of bleeding   Thank you for allowing pharmacy to participate in this patient's care,  Sherron Monday, PharmD, BCCCP Clinical Pharmacist  Phone: [phone] 06/27/2023 7:52 AM  Please check AMION for all [facility] Pharmacy phone numbers After 10:00 PM, call Main Pharmacy [phone]

## 2023-06-27 NOTE — Progress Notes (Signed)
Upon initial assessment, this RN noted Impella CP position to be @ 90 from previous charting noting position @ 94. SWAN position also noted to be @ 49 from previously charted position @ 55. Notified Dr. Shirlee Latch. Orders placed for Xray and ECHO.  Kizzie Furnish, RN

## 2023-06-28 ENCOUNTER — Other Ambulatory Visit: Payer: Self-pay

## 2023-06-28 ENCOUNTER — Inpatient Hospital Stay (HOSPITAL_COMMUNITY): Payer: Medicaid Other

## 2023-06-28 ENCOUNTER — Emergency Department: Admit: 2023-06-28 | Payer: Medicaid Other | Admitting: Cardiovascular Disease

## 2023-06-28 DIAGNOSIS — R570 Cardiogenic shock: Secondary | ICD-10-CM | POA: Diagnosis not present

## 2023-06-28 LAB — CBC
HCT: 22.7 % — ABNORMAL LOW (ref 39.0–52.0)
Hemoglobin: 7.9 g/dL — ABNORMAL LOW (ref 13.0–17.0)
MCH: 28.5 pg (ref 26.0–34.0)
MCHC: 34.8 g/dL (ref 30.0–36.0)
MCV: 81.9 fL (ref 80.0–100.0)
Platelets: 169 10*3/uL (ref 150–400)
RBC: 2.77 MIL/uL — ABNORMAL LOW (ref 4.22–5.81)
RDW: 13.2 % (ref 11.5–15.5)
WBC: 16.5 10*3/uL — ABNORMAL HIGH (ref 4.0–10.5)
nRBC: 0.2 % (ref 0.0–0.2)

## 2023-06-28 LAB — RENAL FUNCTION PANEL
Albumin: 2.8 g/dL — ABNORMAL LOW (ref 3.5–5.0)
Anion gap: 11 (ref 5–15)
BUN: 52 mg/dL — ABNORMAL HIGH (ref 6–20)
CO2: 20 mmol/L — ABNORMAL LOW (ref 22–32)
Calcium: 8 mg/dL — ABNORMAL LOW (ref 8.9–10.3)
Chloride: 100 mmol/L (ref 98–111)
Creatinine, Ser: 4.66 mg/dL — ABNORMAL HIGH (ref 0.61–1.24)
GFR, Estimated: 14 mL/min — ABNORMAL LOW (ref >60)
Glucose, Bld: 197 mg/dL — ABNORMAL HIGH (ref 70–99)
Phosphorus: 2.8 mg/dL (ref 2.5–4.6)
Potassium: 3.6 mmol/L (ref 3.5–5.1)
Sodium: 131 mmol/L — ABNORMAL LOW (ref 135–145)

## 2023-06-28 LAB — COOXEMETRY PANEL
Carboxyhemoglobin: 0.9 % (ref 0.5–1.5)
Methemoglobin: <0.7 % (ref 0.0–1.5)
O2 Saturation: 57 %
Total hemoglobin: 8.5 g/dL — ABNORMAL LOW (ref 12.0–16.0)

## 2023-06-28 LAB — FERRITIN: Ferritin: 180 ng/mL (ref 24–336)

## 2023-06-28 LAB — GLUCOSE, CAPILLARY
Glucose-Capillary: 118 mg/dL — ABNORMAL HIGH (ref 70–99)
Glucose-Capillary: 134 mg/dL — ABNORMAL HIGH (ref 70–99)
Glucose-Capillary: 143 mg/dL — ABNORMAL HIGH (ref 70–99)
Glucose-Capillary: 143 mg/dL — ABNORMAL HIGH (ref 70–99)
Glucose-Capillary: 143 mg/dL — ABNORMAL HIGH (ref 70–99)
Glucose-Capillary: 145 mg/dL — ABNORMAL HIGH (ref 70–99)
Glucose-Capillary: 147 mg/dL — ABNORMAL HIGH (ref 70–99)
Glucose-Capillary: 154 mg/dL — ABNORMAL HIGH (ref 70–99)
Glucose-Capillary: 155 mg/dL — ABNORMAL HIGH (ref 70–99)
Glucose-Capillary: 155 mg/dL — ABNORMAL HIGH (ref 70–99)
Glucose-Capillary: 158 mg/dL — ABNORMAL HIGH (ref 70–99)
Glucose-Capillary: 163 mg/dL — ABNORMAL HIGH (ref 70–99)
Glucose-Capillary: 163 mg/dL — ABNORMAL HIGH (ref 70–99)
Glucose-Capillary: 188 mg/dL — ABNORMAL HIGH (ref 70–99)
Glucose-Capillary: 193 mg/dL — ABNORMAL HIGH (ref 70–99)

## 2023-06-28 LAB — COMPREHENSIVE METABOLIC PANEL
ALT: 28 U/L (ref 0–44)
AST: 52 U/L — ABNORMAL HIGH (ref 15–41)
Albumin: 2.8 g/dL — ABNORMAL LOW (ref 3.5–5.0)
Alkaline Phosphatase: 44 U/L (ref 38–126)
Anion gap: 14 (ref 5–15)
BUN: 71 mg/dL — ABNORMAL HIGH (ref 6–20)
CO2: 19 mmol/L — ABNORMAL LOW (ref 22–32)
Calcium: 8.4 mg/dL — ABNORMAL LOW (ref 8.9–10.3)
Chloride: 102 mmol/L (ref 98–111)
Creatinine, Ser: 6.43 mg/dL — ABNORMAL HIGH (ref 0.61–1.24)
GFR, Estimated: 10 mL/min — ABNORMAL LOW (ref >60)
Glucose, Bld: 171 mg/dL — ABNORMAL HIGH (ref 70–99)
Potassium: 3.7 mmol/L (ref 3.5–5.1)
Sodium: 135 mmol/L (ref 135–145)
Total Bilirubin: 0.6 mg/dL (ref <1.2)
Total Protein: 5.8 g/dL — ABNORMAL LOW (ref 6.5–8.1)

## 2023-06-28 LAB — IRON AND TIBC
Iron: 15 ug/dL — ABNORMAL LOW (ref 45–182)
Saturation Ratios: 6 % — ABNORMAL LOW (ref 17.9–39.5)
TIBC: 273 ug/dL (ref 250–450)
UIBC: 258 ug/dL

## 2023-06-28 LAB — HEPARIN LEVEL (UNFRACTIONATED)
Heparin Unfractionated: 0.15 [IU]/mL — ABNORMAL LOW (ref 0.30–0.70)
Heparin Unfractionated: 0.16 [IU]/mL — ABNORMAL LOW (ref 0.30–0.70)
Heparin Unfractionated: 0.28 [IU]/mL — ABNORMAL LOW (ref 0.30–0.70)

## 2023-06-28 LAB — LACTATE DEHYDROGENASE: LDH: 966 U/L — ABNORMAL HIGH (ref 98–192)

## 2023-06-28 SURGERY — CORONARY/GRAFT ACUTE MI REVASCULARIZATION
Anesthesia: Moderate Sedation

## 2023-06-28 MED ORDER — METOCLOPRAMIDE HCL 5 MG/ML IJ SOLN
10.0000 mg | Freq: Three times a day (TID) | INTRAMUSCULAR | Status: DC
  Administered 2023-06-28 – 2023-07-02 (×12): 10 mg via INTRAVENOUS
  Filled 2023-06-28 (×12): qty 2

## 2023-06-28 MED ORDER — DARBEPOETIN ALFA 100 MCG/0.5ML IJ SOSY
100.0000 ug | PREFILLED_SYRINGE | Freq: Once | INTRAMUSCULAR | Status: AC
  Administered 2023-06-28: 100 ug via SUBCUTANEOUS
  Filled 2023-06-28: qty 0.5

## 2023-06-28 MED ORDER — PANTOPRAZOLE SODIUM 40 MG IV SOLR
40.0000 mg | INTRAVENOUS | Status: DC
  Administered 2023-06-28 – 2023-06-30 (×3): 40 mg via INTRAVENOUS
  Filled 2023-06-28 (×3): qty 10

## 2023-06-28 MED ORDER — INSULIN GLARGINE-YFGN 100 UNIT/ML ~~LOC~~ SOLN
22.0000 [IU] | Freq: Every day | SUBCUTANEOUS | Status: DC
  Filled 2023-06-28: qty 0.22

## 2023-06-28 MED ORDER — PRISMASOL BGK 4/2.5 32-4-2.5 MEQ/L REPLACEMENT SOLN: Status: DC

## 2023-06-28 MED ORDER — ORAL CARE MOUTH RINSE
15.0000 mL | OROMUCOSAL | Status: DC
  Administered 2023-06-29 – 2023-07-04 (×20): 15 mL via OROMUCOSAL

## 2023-06-28 MED ORDER — INSULIN ASPART 100 UNIT/ML IJ SOLN: 0.0000 [IU] | Freq: Three times a day (TID) | INTRAMUSCULAR | Status: DC

## 2023-06-28 MED ORDER — RENA-VITE PO TABS
1.0000 | ORAL_TABLET | Freq: Two times a day (BID) | ORAL | Status: DC
  Administered 2023-06-28 – 2023-07-01 (×6): 1
  Filled 2023-06-28 (×6): qty 1

## 2023-06-28 MED ORDER — HEPARIN SODIUM (PORCINE) 1000 UNIT/ML DIALYSIS
1000.0000 [IU] | INTRAMUSCULAR | Status: DC | PRN
  Administered 2023-07-02: 2800 [IU] via INTRAVENOUS_CENTRAL
  Filled 2023-06-28: qty 6
  Filled 2023-06-28: qty 3
  Filled 2023-06-28 (×2): qty 6

## 2023-06-28 MED ORDER — SODIUM CHLORIDE 0.9 % FOR CRRT: INTRAVENOUS_CENTRAL | Status: DC | PRN

## 2023-06-28 MED ORDER — INSULIN GLARGINE-YFGN 100 UNIT/ML ~~LOC~~ SOLN
15.0000 [IU] | Freq: Every day | SUBCUTANEOUS | Status: DC
  Filled 2023-06-28: qty 0.15

## 2023-06-28 MED ORDER — PROSOURCE TF20 ENFIT COMPATIBL EN LIQD
60.0000 mL | Freq: Three times a day (TID) | ENTERAL | Status: DC
  Administered 2023-06-28 – 2023-07-04 (×17): 60 mL
  Filled 2023-06-28 (×17): qty 60

## 2023-06-28 MED ORDER — ORAL CARE MOUTH RINSE
15.0000 mL | OROMUCOSAL | Status: DC | PRN
Start: 2023-06-28 — End: 2023-07-04

## 2023-06-28 MED ORDER — PRISMASOL BGK 4/2.5 32-4-2.5 MEQ/L EC SOLN: Status: DC

## 2023-06-28 MED ORDER — VITAL 1.5 CAL PO LIQD
1000.0000 mL | ORAL | Status: DC
  Administered 2023-06-28 – 2023-07-04 (×7): 1000 mL
  Filled 2023-06-28 (×2): qty 1000

## 2023-06-28 MED ORDER — INSULIN REGULAR(HUMAN) IN NACL 100-0.9 UT/100ML-% IV SOLN
INTRAVENOUS | Status: AC
  Administered 2023-06-28: 3 [IU]/h via INTRAVENOUS
  Administered 2023-06-29: 2.4 [IU]/h via INTRAVENOUS
  Filled 2023-06-28: qty 100

## 2023-06-28 MED ORDER — INSULIN ASPART 100 UNIT/ML IJ SOLN: 0.0000 [IU] | Freq: Every day | INTRAMUSCULAR | Status: DC

## 2023-06-28 MED ORDER — BOOST PLUS PO LIQD
237.0000 mL | Freq: Three times a day (TID) | ORAL | Status: DC
  Administered 2023-06-28: 237 mL via ORAL
  Filled 2023-06-28 (×2): qty 237

## 2023-06-28 NOTE — Progress Notes (Signed)
[facility] Progress Note   Assessment/ Plan:   Assessment/Plan: 52 year old male with hypertension, diabetes, and mild CKD.  He now presents late after MI and is suffering from cardiogenic shock complicated by acute renal failure 1.Renal-does have some mild CKD at baseline.  Also at least 1 episode in the past of AKI.  Urinalyses in the past have shown proteinuria-also for some reason had 1 done on 12/13 that showed some microscopic hematuria..  Will check urine here today for completeness sake but suspect that major etiology of his acute kidney injury is hemodynamic in nature in the setting of cardiogenic shock.  Has an Impella in place and is on norepinephrine and milrinone to reinforce blood pressure and kidney perfusion.   - did not respond to Lasix challenge - needs CRRT - all 4K bath, no heparin as he is on systemic gtt - no blood products, OK with ESA 2. Hypertension/volume  -is hypotensive and slightly volume overloaded.  Medical management with milrinone and pressor support.  Lasix challenge as above 3.  Cardiovascular-significant issue with multivessel CAD that was unable to be intervened upon.  Continued cardiac dysfunction with Impella, milrinone and norepinephrine.  Per cardiology  4. Anemia  -supportive care at this time  - start Aranesp  Subjective:    Still without adequate response to Lasix gtt.  Starting to have nausea and hiccups.  Needs to start CRRT.    Objective:   BP 96/64   Pulse 98   Temp 98.8 F (37.1 C)   Resp 17   Ht 6\' 3"  (1.905 m)   Wt 88.3 kg   SpO2 98%   BMI 24.33 kg/m   Intake/Output Summary (Last 24 hours) at 06/28/2023 9147 Last data filed at 06/28/2023 0900 Gross per 24 hour  Intake 2648.32 ml  Output 326 ml  Net 2322.32 ml   Weight change: 0 kg  Physical Exam: Gen: lying in bed, appears ill CVS: RRR Resp clear anteriorly Abd: mildly distended Ext: 1+ LE edema ACCESS; L internal jugular nontunneled HD  cath  Imaging: DG Chest Port 1 View Result Date: 06/27/2023 CLINICAL DATA:  [id number] Encounter for central line placement [id number] EXAM: PORTABLE CHEST 1 VIEW COMPARISON:  Same day chest x-ray FINDINGS: Interval placement of left internal jugular approach hemodialysis catheter with distal tip terminating at the level of the proximal right atrium. Right IJ approach pulmonary arterial catheter and inferior approach Impella device remain in place. Stable heart size. No focal airspace consolidation, pleural effusion, or pneumothorax. IMPRESSION: Interval placement of left internal jugular approach hemodialysis catheter with distal tip terminating at the level of the proximal right atrium. No pneumothorax. Electronically Signed   By: Duanne Guess D.O.   On: 06/27/2023 15:34   DG CHEST PORT 1 VIEW Result Date: 06/27/2023 CLINICAL DATA:  [id number] CHF (congestive heart failure) (HCC) 97293 EXAM: PORTABLE CHEST 1 VIEW COMPARISON:  06/26/2023 FINDINGS: Right IJ approach pulmonary arterial catheter terminates at the level of the distal right main pulmonary artery. Impella device remains unchanged in positioning. Stable heart size. No focal airspace consolidation, pleural effusion, or pneumothorax. IMPRESSION: Stable radiographic appearance of the chest.  No acute findings. Electronically Signed   By: Duanne Guess D.O.   On: 06/27/2023 12:04   ECHOCARDIOGRAM LIMITED Result Date: 06/27/2023    ECHOCARDIOGRAM LIMITED REPORT   Patient Name:   Gabriel Shaw Date of Exam: 06/27/2023 Medical Rec #:  [id number]     Height:       75.0 in  Accession #:    [id number]    Weight:       194.7 lb Date of Birth:  25-Sep-1970    BSA:          2.169 m Patient Age:    52 years      BP:           86/75 mmHg Patient Gender: M             HR:           128 bpm. Exam Location:  Inpatient Procedure: Limited Echo Indications:    CHF  History:        Patient has prior history of Echocardiogram examinations, most                 recent  06/26/2023.  Sonographer:    Harriette Bouillon RDCS Referring Phys: [phone] DALTON S MCLEAN IMPRESSIONS  1. Severe LVE with global hypokinesis EF 20-25% Imprella device 4.1 cm distal to aortic annulus Apical thrombus again noted.  2. Right ventricular systolic function is mildly reduced. The right ventricular size is normal.  3. The inferior vena cava is dilated in size with >50% respiratory variability, suggesting right atrial pressure of 8 mmHg. FINDINGS  Right Ventricle: The right ventricular size is normal. Right ventricular systolic function is mildly reduced. Pericardium: Trivial pericardial effusion is present. The pericardial effusion is localized near the right atrium. Venous: The inferior vena cava is dilated in size with greater than 50% respiratory variability, suggesting right atrial pressure of 8 mmHg. Additional Comments: Severe LVE with global hypokinesis EF 20-25% Imprella device 4.1 cm distal to aortic annulus Apical thrombus again noted.  IVC IVC diam: 2.20 cm Charlton Haws MD Electronically signed by Charlton Haws MD Signature Date/Time: 06/27/2023/10:54:33 AM    Final     Labs: BMET Recent Labs  Lab 06/25/23 1914 06/25/23 1023 06/25/23 1206 06/26/23 0355 06/26/23 1502 06/27/23 0427 06/27/23 1537 06/28/23 0408  NA 139 141  139 139 134* 131* 132* 130* 135  K 4.3 4.2  4.4 4.8 3.7 3.8 3.9 4.0 3.7  CL 106  --  106 104 100 102 98 102  CO2 18*  --   --  20* 20* 18* 18* 19*  GLUCOSE 256*  --  301* 319* 295* 288* 337* 171*  BUN 39*  --  41* 47* 56* 58* 68* 71*  CREATININE 2.42*  --  2.30* 3.37* 4.06* 5.21* 5.87* 6.43*  CALCIUM 8.7*  --   --  8.4* 8.2* 7.9* 8.2* 8.4*   CBC Recent Labs  Lab 06/25/23 0822 06/25/23 1023 06/26/23 0355 06/27/23 0427 06/27/23 1537 06/28/23 0408  WBC 19.5*   < > 19.2* 18.2* 15.8* 16.5*  NEUTROABS 16.0*  --   --   --   --   --   HGB 12.4*   < > 9.7* 8.6* 8.5* 7.9*  HCT 37.4*   < > 28.5* 24.5* 24.1* 22.7*  MCV 87.2   < > 84.3 82.5 82.3 81.9  PLT 348    < > 270 193 195 169   < > = values in this interval not displayed.    Medications:     aspirin EC  81 mg Oral Daily   atorvastatin  80 mg Oral Daily   Chlorhexidine Gluconate Cloth  6 each Topical Daily   feeding supplement  237 mL Oral BID BM   insulin aspart  0-20 Units Subcutaneous TID WC   insulin aspart  0-5 Units Subcutaneous  QHS   insulin glargine-yfgn  22 Units Subcutaneous Daily   polyethylene glycol  17 g Oral Daily   senna-docusate  1 tablet Oral BID   sodium chloride flush  3 mL Intravenous Q12H    Bufford Buttner, MD 06/28/2023, 9:03 AM

## 2023-06-28 NOTE — Progress Notes (Unsigned)
   06/28/2023  Patient ID: Gabriel Shaw, male   DOB: 11/12/1970, 52 y.o.   MRN: [id number]  Telephone follow-up visit scheduled, but patient is currently hospitalized.  Rescheduling to try to contact patient next Friday 1/7 at 1030am if discharged and home.  Lenna Gilford, PharmD, DPLA

## 2023-06-28 NOTE — Procedures (Signed)
Cortrak  Person Inserting Tube:  Greig Castilla D, RD Tube Type:  Cortrak - 43 inches Tube Size:  10 Tube Location:  Left nare Secured by: Bridle Technique Used to Measure Tube Placement:  Marking at nare/corner of mouth Cortrak Secured At:  79 cm Procedure Comments:  Cortrak Tube Team Note:  Consult received to place a Cortrak feeding tube.   X-ray is required, abdominal x-ray has been ordered by the Cortrak team. Please confirm tube placement before using the Cortrak tube.   If the tube becomes dislodged please keep the tube and contact the Cortrak team at [email or web address] for replacement.  If after hours and replacement cannot be delayed, place a NG tube and confirm placement with an abdominal x-ray.    Greig Castilla, RD, LDN Registered Dietitian II Please reach out via secure chat Weekend on-call pager # available in [facility]

## 2023-06-28 NOTE — Progress Notes (Signed)
PHARMACY - ANTICOAGULATION CONSULT NOTE  Pharmacy Consult for heparin Indication:  Impella CP/apical thrombus  No Known Allergies  Patient Measurements: Height: 6\' 3"  (190.5 cm) Weight: 88.3 kg (194 lb 10.7 oz) IBW/kg (Calculated) : 84.5 Heparin Dosing Weight: 107 kg  Vital Signs: Temp: 98.8 F (37.1 C) (12/30 0845) Temp Source: Core (12/30 0400) BP: 96/64 (12/30 0845) Pulse Rate: 98 (12/30 0845)  Labs: Recent Labs    06/27/23 0427 06/27/23 0622 06/27/23 1537 06/28/23 0044 06/28/23 0408 06/28/23 0857  HGB 8.6*  --  8.5*  --  7.9*  --   HCT 24.5*  --  24.1*  --  22.7*  --   PLT 193  --  195  --  169  --   HEPARINUNFRC  --    < > 0.10* 0.15*  --  0.16*  CREATININE 5.21*  --  5.87*  --  6.43*  --    < > = values in this interval not displayed.    Estimated Creatinine Clearance: 16.1 mL/min (A) (by C-G formula based on SCr of 6.43 mg/dL (H)).   Medical History: Past Medical History:  Diagnosis Date   Depression 1989   Diabetes mellitus without complication (HCC)    Gastroesophageal reflux disease 01/31/2014   HLD (hyperlipidemia) 03/12/2023   Hypertension    ST elevation myocardial infarction (STEMI) (HCC) 06/25/2023   Ulcer 2016     Assessment: 87 yoM admitted as late presenting STEMI s/p unsuccessful PCI and Impella CP placement. ECHO showing large apical thrombus. Pharmacy to manage heparin. Bicarbonate purge started. No AC PTA.  Heparin level this am remains subtherapeutic at 0.16, H/H low but stable, pltc stable.  Goal of Therapy:  Heparin level 0.3-0.5 units/ml Monitor platelets by anticoagulation protocol: Yes   Plan:  Increase heparin to 1700 units/hr Heparin level in 8 hours Monitor daily heparin level, CBC, signs/symptoms of bleeding    Fredonia Highland, PharmD, BCPS, [facility] Clinical Pharmacist [phone] Please check AMION for all [facility] Pharmacy numbers 06/28/2023

## 2023-06-28 NOTE — Progress Notes (Signed)
PHARMACY - ANTICOAGULATION CONSULT NOTE  Pharmacy Consult for heparin Indication:  Impella CP/apical thrombus  No Known Allergies  Patient Measurements: Height: 6\' 3"  (190.5 cm) Weight: 88.3 kg (194 lb 10.7 oz) IBW/kg (Calculated) : 84.5 Heparin Dosing Weight: 107 kg  Vital Signs: Temp: 99.3 F (37.4 C) (12/30 1830) BP: 100/72 (12/30 1830) Pulse Rate: 96 (12/30 1830)  Labs: Recent Labs    06/27/23 0427 06/27/23 0622 06/27/23 1537 06/28/23 0044 06/28/23 0408 06/28/23 0857 06/28/23 1553 06/28/23 1749  HGB 8.6*  --  8.5*  --  7.9*  --   --   --   HCT 24.5*  --  24.1*  --  22.7*  --   --   --   PLT 193  --  195  --  169  --   --   --   HEPARINUNFRC  --    < > 0.10* 0.15*  --  0.16*  --  0.28*  CREATININE 5.21*  --  5.87*  --  6.43*  --  4.66*  --    < > = values in this interval not displayed.    Estimated Creatinine Clearance: 22.2 mL/min (A) (by C-G formula based on SCr of 4.66 mg/dL (H)).   Medical History: Past Medical History:  Diagnosis Date   Depression 1989   Diabetes mellitus without complication (HCC)    Gastroesophageal reflux disease 01/31/2014   HLD (hyperlipidemia) 03/12/2023   Hypertension    ST elevation myocardial infarction (STEMI) (HCC) 06/25/2023   Ulcer 2016     Assessment: 89 yoM admitted as late presenting STEMI s/p unsuccessful PCI and Impella CP placement. ECHO showing large apical thrombus. Pharmacy to manage heparin. Bicarbonate purge started. No AC PTA.  Heparin level tonight is slightly subtherapeutic at 0.28, on 1700 units/hr. No s/sx of bleeding or infusion issues. No impella issues.   Goal of Therapy:  Heparin level 0.3-0.5 units/ml Monitor platelets by anticoagulation protocol: Yes   Plan:  Increase heparin to 1800 units/hr Order heparin level in 8 hours with AM labs  Monitor daily heparin level, CBC, signs/symptoms of bleeding   Thank you for allowing pharmacy to participate in this patient's care,  Sherron Monday,  PharmD, BCCCP Clinical Pharmacist  Phone: [phone] 06/28/2023 7:13 PM  Please check AMION for all [facility] Pharmacy phone numbers After 10:00 PM, call Main Pharmacy [phone]

## 2023-06-28 NOTE — Progress Notes (Signed)
PHARMACY - ANTICOAGULATION CONSULT NOTE  Pharmacy Consult for heparin Indication:  Impella CP/apical thrombus  No Known Allergies  Patient Measurements: Height: 6\' 3"  (190.5 cm) Weight: 88.3 kg (194 lb 10.7 oz) IBW/kg (Calculated) : 84.5 Heparin Dosing Weight: 107 kg  Vital Signs: Temp: 99.7 F (37.6 C) (12/30 0100) Temp Source: Core (12/30 0000) BP: 107/76 (12/30 0100) Pulse Rate: 104 (12/30 0100)  Labs: Recent Labs    06/25/23 0822 06/25/23 1023 06/26/23 0355 06/26/23 0430 06/26/23 1502 06/26/23 2223 06/27/23 0427 06/27/23 0622 06/27/23 1537 06/28/23 0044  HGB 12.4*   < > 9.7*  --   --   --  8.6*  --  8.5*  --   HCT 37.4*   < > 28.5*  --   --   --  24.5*  --  24.1*  --   PLT 348   < > 270  --   --   --  193  --  195  --   APTT 31  --   --   --   --   --   --   --   --   --   LABPROT 14.6  --   --   --   --   --   --   --   --   --   INR 1.1  --   --   --   --   --   --   --   --   --   HEPARINUNFRC  --    < >  --    < > <0.10*   < >  --  <0.10* 0.10* 0.15*  CREATININE 2.42*   < > 3.37*  --  4.06*  --  5.21*  --  5.87*  --   TROPONINIHS >24,000*  --   --   --   --   --   --   --   --   --    < > = values in this interval not displayed.    Estimated Creatinine Clearance: 17.6 mL/min (A) (by C-G formula based on SCr of 5.87 mg/dL (H)).   Medical History: Past Medical History:  Diagnosis Date   Depression 1989   Diabetes mellitus without complication (HCC)    Gastroesophageal reflux disease 01/31/2014   HLD (hyperlipidemia) 03/12/2023   Hypertension    ST elevation myocardial infarction (STEMI) (HCC) 06/25/2023   Ulcer 2016     Assessment: 89 yoM admitted as late presenting STEMI s/p unsuccessful PCI and Impella CP placement. ECHO showing large apical thrombus. Pharmacy to manage heparin. Bicarbonate purge started. No AC PTA.  12/30 AM update:  Heparin level sub-therapeutic   Goal of Therapy:  Heparin level 0.3-0.5 units/ml Monitor platelets by  anticoagulation protocol: Yes   Plan:  Increase heparin to 1550 units/hr Heparin level in 6-8 hours Monitor daily heparin level, CBC, signs/symptoms of bleeding   Abran Duke, PharmD, BCPS Clinical Pharmacist Phone: [phone]

## 2023-06-28 NOTE — Progress Notes (Signed)
Heart Failure Navigator Progress Note  Assessed for Heart & Vascular TOC clinic readiness.  Patient does not meet criteria due to Advanced Heart Failure Team patient of Dr. Bensimhon.   Navigator will sign off at this time.   Lucilia Yanni, BSN, RN Heart Failure Nurse Navigator Secure Chat Only   

## 2023-06-28 NOTE — Progress Notes (Signed)
Initial Nutrition Assessment  DOCUMENTATION CODES:   Non-severe (moderate) malnutrition in context of chronic illness  INTERVENTION:   Change to GI SOFT (easy to digest-low in fiber and lower in fat) with 1.8 L fluid restriction;   Discussed GI concerns with Dr. Shirlee Latch and plan to start protonix and reglan   Tube Feeding Via Cortrak:  Vital 1.5 at 20 ml/hr with goal of 65 ml/hr Pro-source TF20 60 mL TID TF at goal provides 2580 kcals, 165 g of protein and 1186 mL of free water  Add Renal MVI x 2 to account for losses via CRRT Add Thiamine 100 mg daily x 7 days  Likely need to increase bowel regimen given constipation if no BMs post TF initiation   NUTRITION DIAGNOSIS:   Moderate Malnutrition related to chronic illness as evidenced by mild fat depletion, mild muscle depletion, energy intake < 75% for > or equal to 1 month.  GOAL:   Patient will meet greater than or equal to 90% of their needs  MONITOR:   TF tolerance, Labs, Weight trends, Skin, PO intake  REASON FOR ASSESSMENT:   Consult Assessment of nutrition requirement/status, Poor PO (CRRT)  ASSESSMENT:   52 yo male admitted with NSTEMI and cardiogenic shock, CAD with failed PCI, Impella CP placed. PMH includes DM, HTN, GERD, HLD, GI ulcer  12/27 R/LHC, failed PCI, Impella placement, Transfer to [facility]. RHC consistent with LV dominant shock  Impella remains on place.  Pt did not respond to lasix challenge, starting on CRRT  Currently on Heart Healthy/Carb Modified diet with 1800 mL fluid restriction; no recorded po intake.   While RD in room, pt ate a bite of veggie with hummus and soon after regurgitated it back up. Pt a little while later took some Boost Plus and this also came back up. Pt does report this does happen at home as well but has been more significant/more frequent recently. Pt reports he had broth yesterday and then had large volume emesis. Pt has not eaten anything with success since  admission. Pt reports diet at home has been just rice and chicken 1 to 2 times daily; pt has been taking a multivitamin but no oral nutrition supplements.   Pt reports hx of reflux, also noted in PMH. Pt indicates that if he is not sitting up previous to this admission, he would have regurgitation. Pt denies evaluation as outpatient by GI MD. Currently pt is sitting up and still regurgitating; pt also reports associated nausea. Belching and hiccups also noted.   Pt currently does not have a PPI, any meds for reflux. RD also wonders if delay emptying playing a role in his current situation as well. Pt high risk for delayed emptying given acute illness, on pressors and CRRT, hx of DM  Lab Results  Component Value Date   HGBA1C 7.0 (H) 06/11/2023  Noted pt has been very diligent about checking his CBGs at home and has experienced some issues lately with blood sugar control in context of acute illness, poor appetite with erratic intake. Pt would give himself insulin for the meal and then "vomit" up the food; occasionally having lows. CBGs well controlled on insulin gtt currently.   Weight down to 88.3 kg (195 pounds); pt states this is the least he has ever weighed with UBW around 240-250. Appears pt has experienced some  Pt reports poor dentition with front teeth "loose" but can chew most items  Labs: CBGs 143-204, sodium 135 (wdl), potassium 3.7 (wdl), Creatinine  up to 6.43, BUN 71 Meds: zofran prn, phenergan prn, senna-docusate BID,    NUTRITION - FOCUSED PHYSICAL EXAM:  Flowsheet Row Most Recent Value  Orbital Region Mild depletion  Upper Arm Region Unable to assess  Thoracic and Lumbar Region No depletion  Buccal Region Mild depletion  Temple Region Mild depletion  Clavicle Bone Region Mild depletion  Clavicle and Acromion Bone Region Mild depletion  Scapular Bone Region Mild depletion  Dorsal Hand Unable to assess  Patellar Region Unable to assess  Anterior Thigh Region Unable to  assess  Posterior Calf Region Unable to assess  Edema (RD Assessment) Mild       Diet Order:  HEART HEALTHY/CARB MODIFIED with 1800 mL fluid restriction   EDUCATION NEEDS:   Education needs have been addressed  Skin:  Skin Assessment: Reviewed RN Assessment  Last BM:  Constipation but +smear this AM  Height:   Ht Readings from Last 1 Encounters:  06/25/23 6\' 3"  (1.905 m)    Weight:   Wt Readings from Last 1 Encounters:  06/28/23 88.3 kg    BMI:  Body mass index is 24.33 kg/m.  Estimated Nutritional Needs:   Kcal:  2500-2700 kcals  Protein:  135-170 g  Fluid:  1.8 L   Romelle Starcher MS, RDN, LDN, CNSC Registered Dietitian 3 Clinical Nutrition RD Inpatient Contact Info in Amion

## 2023-06-28 NOTE — Progress Notes (Addendum)
Patient ID: Gabriel Shaw, male   DOB: 05-Oct-1970, 52 y.o.   MRN: [id number]     Advanced Heart Failure Rounding Note  Cardiologist: None   Chief Complaint: Cardiogenic shock  Subjective:    Currently on 0.25 milrinone + 1 NE. UOP 325 cc last 24 hrs despite high-dose IV lasix challenge. Scr up further to 6.4.   T max 100F overnight, WBCs down slightly 18>16.5K, PCT 0.25. BC X 2 pending. On ceftriaxone.   Impella pulled back 12/29. Echo 12/29: EF 25% with LAD territory WMAs, LV thrombus still present, RV normal, Impella at 4.0 cm but good position.   Impella CP: P7  Flow 3.1 L/min  Swan: CVP 8-9 PA 30/18 (24) CI 2.13 Co-ox 57% LDH 1261 => 1012 => 966  Hgb slowly drifting down, 7.9 this am  Maintaining SR with amiodarone gtt.   Reports hiccups and nausea when lying flat. No chest pain or dyspnea at rest.    Objective:   Weight Range: 88.3 kg Body mass index is 24.33 kg/m.   Vital Signs:   Temp:  [94.5 F (34.7 C)-100 F (37.8 C)] 98.6 F (37 C) (12/30 0700) Pulse Rate:  [94-116] 99 (12/30 0700) Resp:  [5-31] 16 (12/30 0700) BP: (86-127)/(53-85) 97/69 (12/30 0700) SpO2:  [84 %-100 %] 98 % (12/30 0700) Weight:  [88.3 kg] 88.3 kg (12/30 0440) Last BM Date :  (PTA)  Weight change: Filed Weights   06/26/23 0500 06/27/23 0500 06/28/23 0440  Weight: 95.8 kg 88.3 kg 88.3 kg    Intake/Output:   Intake/Output Summary (Last 24 hours) at 06/28/2023 0802 Last data filed at 06/28/2023 0700 Gross per 24 hour  Intake 2627.33 ml  Output 295 ml  Net 2332.33 ml      Physical Exam    General:  Fatigued appearing HEENT: normal Neck: R internal jugular SWAN, L internal jugular HD cath Cor: Regular rate & rhythm. No rubs, gallops or murmurs. Lungs: clear anteriorly Abdomen: soft, nontender, nondistended.  Extremities: no cyanosis, clubbing, rash, edema, R femoral impella site stable with no hematoma. + doppler signals b/l DP and PT Neuro: alert & orientedx3.  Affect pleasant    Telemetry   SR/ST 90s-100s  Labs    CBC Recent Labs    06/25/23 0822 06/25/23 1023 06/27/23 1537 06/28/23 0408  WBC 19.5*   < > 15.8* 16.5*  NEUTROABS 16.0*  --   --   --   HGB 12.4*   < > 8.5* 7.9*  HCT 37.4*   < > 24.1* 22.7*  MCV 87.2   < > 82.3 81.9  PLT 348   < > 195 169   < > = values in this interval not displayed.   Basic Metabolic Panel Recent Labs    95/18/84 1537 06/28/23 0408  NA 130* 135  K 4.0 3.7  CL 98 102  CO2 18* 19*  GLUCOSE 337* 171*  BUN 68* 71*  CREATININE 5.87* 6.43*  CALCIUM 8.2* 8.4*   Liver Function Tests Recent Labs    06/27/23 0427 06/28/23 0408  AST 72* 52*  ALT 33 28  ALKPHOS 45 44  BILITOT 0.8 0.6  PROT 5.6* 5.8*  ALBUMIN 2.8* 2.8*   No results for input(s): "LIPASE", "AMYLASE" in the last 72 hours. Cardiac Enzymes No results for input(s): "CKTOTAL", "CKMB", "CKMBINDEX", "TROPONINI" in the last 72 hours.  BNP: BNP (last 3 results) No results for input(s): "BNP" in the last 8760 hours.  ProBNP (last 3 results) No results  for input(s): "PROBNP" in the last 8760 hours.   D-Dimer No results for input(s): "DDIMER" in the last 72 hours. Hemoglobin A1C No results for input(s): "HGBA1C" in the last 72 hours. Fasting Lipid Panel Recent Labs    06/25/23 0822  CHOL 120  HDL 52  LDLCALC 52  TRIG 78  CHOLHDL 2.3   Thyroid Function Tests No results for input(s): "TSH", "T4TOTAL", "T3FREE", "THYROIDAB" in the last 72 hours.  Invalid input(s): "FREET3"  Other results:   Imaging    DG Chest Port 1 View Result Date: 06/27/2023 CLINICAL DATA:  [id number] Encounter for central line placement [id number] EXAM: PORTABLE CHEST 1 VIEW COMPARISON:  Same day chest x-ray FINDINGS: Interval placement of left internal jugular approach hemodialysis catheter with distal tip terminating at the level of the proximal right atrium. Right IJ approach pulmonary arterial catheter and inferior approach Impella device remain  in place. Stable heart size. No focal airspace consolidation, pleural effusion, or pneumothorax. IMPRESSION: Interval placement of left internal jugular approach hemodialysis catheter with distal tip terminating at the level of the proximal right atrium. No pneumothorax. Electronically Signed   By: Duanne Guess D.O.   On: 06/27/2023 15:34   DG CHEST PORT 1 VIEW Result Date: 06/27/2023 CLINICAL DATA:  [id number] CHF (congestive heart failure) (HCC) [id number] EXAM: PORTABLE CHEST 1 VIEW COMPARISON:  06/26/2023 FINDINGS: Right IJ approach pulmonary arterial catheter terminates at the level of the distal right main pulmonary artery. Impella device remains unchanged in positioning. Stable heart size. No focal airspace consolidation, pleural effusion, or pneumothorax. IMPRESSION: Stable radiographic appearance of the chest.  No acute findings. Electronically Signed   By: Duanne Guess D.O.   On: 06/27/2023 12:04     Medications:     Scheduled Medications:  aspirin EC  81 mg Oral Daily   atorvastatin  80 mg Oral Daily   Chlorhexidine Gluconate Cloth  6 each Topical Daily   feeding supplement  237 mL Oral BID BM   polyethylene glycol  17 g Oral Daily   senna-docusate  1 tablet Oral BID   sodium chloride flush  3 mL Intravenous Q12H    Infusions:  amiodarone 30 mg/hr (06/28/23 0700)   cefTRIAXone (ROCEPHIN)  IV 2 g (06/28/23 0731)   furosemide 62 mL/hr at 06/28/23 0700   heparin 1,550 Units/hr (06/28/23 0700)   insulin 3 Units/hr (06/28/23 0700)   milrinone 0.25 mcg/kg/min (06/28/23 0700)   norepinephrine (LEVOPHED) Adult infusion 1 mcg/min (06/28/23 0727)   promethazine (PHENERGAN) injection (IM or IVPB) 12.5 mg (06/27/23 2030)   sodium bicarbonate 25 mEq (Impella PURGE) in dextrose 5 % 1000 mL bag      PRN Medications: acetaminophen, ALPRAZolam, dextrose, haloperidol lactate, ondansetron (ZOFRAN) IV, mouth rinse, promethazine (PHENERGAN) injection (IM or IVPB), sodium chloride  flush    Assessment/Plan   1. CAD: Late presenting anterior/inferior STEMI/CAD.  70% mid LAD, 100% m-dLAD, 90% pRCA, 90% mRCA, 70% LPAV.  Disease not amenable to PCI, failed attempt to cross LAD occlusion x 3.  No chest pain. Medical management.  - ASA + statin   - no ? blocker w/ shock  2. Acute Systolic Heart Failure/Cardiogenic shock: Ischemic cardiomyopathy, limited echo 12/29 with EF 25% with LAD territory WMAs, LV thrombus, RV normal. Impella at 4.0 cm in good position. Worry that he has nonviable LAD-territory myocardium. Initial RHC with mean RA 6, mean PCWP 20, CI 1.93.  Impella CP placed, currently at P7. On milrinone 0.25 and NE 2. CI 2.1 and  CO-OX 57%. CVP 8-9 and.  Creatinine continuing to rise and little UOP despite high dose IV lasix. Believe he will likely need CVVHD. Nephrology on board. - On heparin gtt with Impella and LV thrombus.  - If we cannot wean off Impella, will need to consider LVAD but AKI and Jehovah's Witness could make this difficult. - Chest xray today  3. AKI: SCr 1.3>>>6.43 today. Only 300 cc UOP over last 24 hrs.  Suspect ATN in setting of cardiogenic shock.     - Maintain CO and MAP.  - Will likely need CVVHD. Nephrology on board. 4. Elevated LFTs: Suspect shock liver, trending down.  5. ID: Tmax 100F overnight with WBCs down slightly, 16.5K.  PCT 0.25.  MRSA PCR negative.  - Covering with ceftriaxone empirically.  6. Type 2DM: On insulin gtt - Transition to basal and SSI. Discussed with PharmD. 7. Anemia: Blood loss at Impella site initially, seems to have stopped.  Hgb 9.7 => 7.9 last few days.  Jehovah's Witness so no blood products.  - he would NOT accept any blood products even in a life and death situation.  8. LV thrombus: On heparin gtt 9. Atrial fibrillation: Noted on 12/29. Converted with amiodarone gtt. Continue amiodarone at 30/hr. - On heparin gtt   Length of Stay: 3  FINCH, LINDSAY N, PA-C  06/28/2023, 8:02 AM  Advanced Heart Failure  Team Pager [phone] (M-F; 7a - 5p)  Please contact [facility] for night-coverage after hours (5p -7a ) and weekends on [email or web address]   Patient seen with PA, agree with the above note.   Currently with Impella P7, flow 3.0 L/min.  Milrinone 0.25, NE 2.  CI 2.13, co-ox 57%.  CVP 8-9.  Poor UOP, creatinine continuing to rise 6.43 today.   Remains in NSR on amiodarone gtt.  On heparin gtt with AF, LV thrombus.   Not eating due to nausea.  No further emesis.   General: NAD Neck: JVP 8 cm, no thyromegaly or thyroid nodule.  Lungs: Clear to auscultation bilaterally with normal respiratory effort. CV: Nondisplaced PMI.  Heart regular S1/S2, no S3/S4, no murmur.  No peripheral edema.   Abdomen: Soft, nontender, no hepatosplenomegaly, no distention.  Skin: Intact without lesions or rashes.  Neurologic: Alert and oriented x 3.  Psych: Normal affect. Extremities: No clubbing or cyanosis.  HEENT: Normal.   Worsening renal function with minimal UOP.  Now with hiccups and ongoing nausea.  Plan to start CVVH today, aim for net negative UF 50 cc/hr.   Continue current support with milrinone and NE.  I will increase Impella to P8 for now with marginal CI at 2.13.    Continue heparin gtt, has LV thrombus and PAF.   He remains in NSR on amiodarone gtt.   Poor po intake, will have nutrition see.  Likely should get Cortrack.  CRITICAL CARE Performed by: Marca Ancona  Total critical care time: 40 minutes  Critical care time was exclusive of separately billable procedures and treating other patients.  Critical care was necessary to treat or prevent imminent or life-threatening deterioration.  Critical care was time spent personally by me on the following activities: development of treatment plan with patient and/or surrogate as well as nursing, discussions with consultants, evaluation of patient's response to treatment, examination of patient, obtaining history from patient or surrogate, ordering  and performing treatments and interventions, ordering and review of laboratory studies, ordering and review of radiographic studies, pulse oximetry and re-evaluation of patient's condition.  Havilah Topor  Shirlee Latch 06/28/2023 9:53 AM

## 2023-06-28 NOTE — Progress Notes (Signed)
   06/28/23 1146  Spiritual Encounters  Type of Visit Initial  Care provided to: Patient  Conversation partners present during encounter Nurse  Referral source Physician  Reason for visit Advance directives  OnCall Visit No  Advance Directives (For Healthcare)  Does Patient Have a Medical Advance Directive? No  Would patient like information on creating a medical advance directive? Yes (Inpatient - patient defers creating a medical advance directive at this time - Information given)   Patient stated he wanted his mother to be his decision maker. He believes he does not need the HCPOA paperwork because of [place] law. Chaplain provided education about the HCPOA and living will. Chaplain left paperwork.   Arlyce Dice, Chaplain Resident [phone]

## 2023-06-29 DIAGNOSIS — R570 Cardiogenic shock: Secondary | ICD-10-CM | POA: Diagnosis not present

## 2023-06-29 LAB — RENAL FUNCTION PANEL
Albumin: 2.6 g/dL — ABNORMAL LOW (ref 3.5–5.0)
Anion gap: 9 (ref 5–15)
BUN: 28 mg/dL — ABNORMAL HIGH (ref 6–20)
CO2: 23 mmol/L (ref 22–32)
Calcium: 7.8 mg/dL — ABNORMAL LOW (ref 8.9–10.3)
Chloride: 101 mmol/L (ref 98–111)
Creatinine, Ser: 2.8 mg/dL — ABNORMAL HIGH (ref 0.61–1.24)
GFR, Estimated: 26 mL/min — ABNORMAL LOW (ref >60)
Glucose, Bld: 206 mg/dL — ABNORMAL HIGH (ref 70–99)
Phosphorus: 2.3 mg/dL — ABNORMAL LOW (ref 2.5–4.6)
Potassium: 4 mmol/L (ref 3.5–5.1)
Sodium: 133 mmol/L — ABNORMAL LOW (ref 135–145)

## 2023-06-29 LAB — COOXEMETRY PANEL
Carboxyhemoglobin: 0.9 % (ref 0.5–1.5)
Methemoglobin: <0.7 % (ref 0.0–1.5)
O2 Saturation: 54.9 %
Total hemoglobin: 7.9 g/dL — ABNORMAL LOW (ref 12.0–16.0)

## 2023-06-29 LAB — COMPREHENSIVE METABOLIC PANEL
ALT: 24 U/L (ref 0–44)
AST: 39 U/L (ref 15–41)
Albumin: 2.6 g/dL — ABNORMAL LOW (ref 3.5–5.0)
Alkaline Phosphatase: 43 U/L (ref 38–126)
Anion gap: 7 (ref 5–15)
BUN: 37 mg/dL — ABNORMAL HIGH (ref 6–20)
CO2: 24 mmol/L (ref 22–32)
Calcium: 7.8 mg/dL — ABNORMAL LOW (ref 8.9–10.3)
Chloride: 103 mmol/L (ref 98–111)
Creatinine, Ser: 3.44 mg/dL — ABNORMAL HIGH (ref 0.61–1.24)
GFR, Estimated: 21 mL/min — ABNORMAL LOW (ref >60)
Glucose, Bld: 225 mg/dL — ABNORMAL HIGH (ref 70–99)
Potassium: 3.7 mmol/L (ref 3.5–5.1)
Sodium: 134 mmol/L — ABNORMAL LOW (ref 135–145)
Total Bilirubin: 0.7 mg/dL (ref 0.0–1.2)
Total Protein: 5.5 g/dL — ABNORMAL LOW (ref 6.5–8.1)

## 2023-06-29 LAB — CBC
HCT: 21.9 % — ABNORMAL LOW (ref 39.0–52.0)
Hemoglobin: 7.6 g/dL — ABNORMAL LOW (ref 13.0–17.0)
MCH: 29.1 pg (ref 26.0–34.0)
MCHC: 34.7 g/dL (ref 30.0–36.0)
MCV: 83.9 fL (ref 80.0–100.0)
Platelets: 141 10*3/uL — ABNORMAL LOW (ref 150–400)
RBC: 2.61 MIL/uL — ABNORMAL LOW (ref 4.22–5.81)
RDW: 13.4 % (ref 11.5–15.5)
WBC: 14.1 10*3/uL — ABNORMAL HIGH (ref 4.0–10.5)
nRBC: 0.5 % — ABNORMAL HIGH (ref 0.0–0.2)

## 2023-06-29 LAB — GLUCOSE, CAPILLARY
Glucose-Capillary: 164 mg/dL — ABNORMAL HIGH (ref 70–99)
Glucose-Capillary: 166 mg/dL — ABNORMAL HIGH (ref 70–99)
Glucose-Capillary: 168 mg/dL — ABNORMAL HIGH (ref 70–99)
Glucose-Capillary: 182 mg/dL — ABNORMAL HIGH (ref 70–99)
Glucose-Capillary: 187 mg/dL — ABNORMAL HIGH (ref 70–99)
Glucose-Capillary: 187 mg/dL — ABNORMAL HIGH (ref 70–99)
Glucose-Capillary: 188 mg/dL — ABNORMAL HIGH (ref 70–99)
Glucose-Capillary: 197 mg/dL — ABNORMAL HIGH (ref 70–99)
Glucose-Capillary: 200 mg/dL — ABNORMAL HIGH (ref 70–99)
Glucose-Capillary: 201 mg/dL — ABNORMAL HIGH (ref 70–99)
Glucose-Capillary: 214 mg/dL — ABNORMAL HIGH (ref 70–99)
Glucose-Capillary: 218 mg/dL — ABNORMAL HIGH (ref 70–99)
Glucose-Capillary: 228 mg/dL — ABNORMAL HIGH (ref 70–99)
Glucose-Capillary: 246 mg/dL — ABNORMAL HIGH (ref 70–99)
Glucose-Capillary: 306 mg/dL — ABNORMAL HIGH (ref 70–99)
Glucose-Capillary: 583 mg/dL (ref 70–99)

## 2023-06-29 LAB — LACTATE DEHYDROGENASE: LDH: 761 U/L — ABNORMAL HIGH (ref 98–192)

## 2023-06-29 LAB — MAGNESIUM: Magnesium: 2.2 mg/dL (ref 1.7–2.4)

## 2023-06-29 LAB — HEPARIN LEVEL (UNFRACTIONATED)
Heparin Unfractionated: 0.43 [IU]/mL (ref 0.30–0.70)
Heparin Unfractionated: 0.4 [IU]/mL (ref 0.30–0.70)

## 2023-06-29 LAB — PHOSPHORUS: Phosphorus: 2.8 mg/dL (ref 2.5–4.6)

## 2023-06-29 LAB — NO BLOOD PRODUCTS

## 2023-06-29 MED ORDER — IRON SUCROSE 500 MG IVPB - SIMPLE MED
500.0000 mg | Freq: Once | INTRAVENOUS | Status: DC
  Filled 2023-06-29: qty 275

## 2023-06-29 MED ORDER — CHLORPROMAZINE HCL 25 MG/ML IJ SOLN
25.0000 mg | Freq: Three times a day (TID) | INTRAMUSCULAR | Status: DC | PRN
  Administered 2023-06-29: 25 mg via INTRAMUSCULAR
  Filled 2023-06-29 (×2): qty 1

## 2023-06-29 MED ORDER — SODIUM CHLORIDE 0.9 % IV SOLN
500.0000 mg | Freq: Once | INTRAVENOUS | Status: AC
  Administered 2023-06-29: 500 mg via INTRAVENOUS
  Filled 2023-06-29: qty 25

## 2023-06-29 MED ORDER — IRON SUCROSE 200 MG IVPB - SIMPLE MED: 200.0000 mg | Status: DC

## 2023-06-29 MED ORDER — INSULIN GLARGINE-YFGN 100 UNIT/ML ~~LOC~~ SOLN
20.0000 [IU] | Freq: Every day | SUBCUTANEOUS | Status: AC
  Administered 2023-06-29 – 2023-07-01 (×3): 20 [IU] via SUBCUTANEOUS
  Filled 2023-06-29 (×3): qty 0.2

## 2023-06-29 MED ORDER — INSULIN ASPART 100 UNIT/ML IJ SOLN
0.0000 [IU] | INTRAMUSCULAR | Status: DC
  Administered 2023-06-29: 11 [IU] via SUBCUTANEOUS
  Administered 2023-06-29: 3 [IU] via SUBCUTANEOUS
  Administered 2023-06-30 (×2): 5 [IU] via SUBCUTANEOUS
  Administered 2023-06-30: 8 [IU] via SUBCUTANEOUS
  Administered 2023-06-30: 15 [IU] via SUBCUTANEOUS

## 2023-06-29 MED ORDER — INSULIN ASPART 100 UNIT/ML IJ SOLN
3.0000 [IU] | INTRAMUSCULAR | Status: DC
Start: 2023-06-29 — End: 2023-06-30
  Administered 2023-06-29 – 2023-06-30 (×5): 3 [IU] via SUBCUTANEOUS

## 2023-06-29 NOTE — Progress Notes (Addendum)
 Patient ID: Gabriel Shaw, male   DOB: 11-07-1970, 52 y.o.   MRN: [id number]     Advanced Heart Failure Rounding Note  Cardiologist: None   Chief Complaint: Cardiogenic shock  Subjective:    Currently on 0.25 milrinone  + 1 NE. UOP 325 cc last 24 hrs despite high-dose IV lasix  challenge. Scr up further to 6.4.   Impella pulled back 12/29. Echo 12/29: EF 25% with LAD territory WMAs, LV thrombus still present, RV normal, Impella at 4.0 cm but good position.   T max 100F overnight, WBCs down slightly 18>16.5>14.1K, PCT 0.25. BC X 2 negative. On ceftriaxone , stop at day 5.   Impella CP: P8 Flow 3.4 L/min  Swan: CVP 5 PA 38/16 (22) CI 1.88 Co-ox 55% LDH 1261 => 1012 => 966 >761  Hgb slowly drifting down, 7.9>7.6 this am  Maintaining SR with amiodarone  gtt.   Still with persistent hiccups, otherwise resting comfortably in bed.   Objective:   Weight Range: 94.4 kg Body mass index is 26.01 kg/m.   Vital Signs:   Temp:  [94.6 F (34.8 C)-100 F (37.8 C)] 98.8 F (37.1 C) (12/31 1045) Pulse Rate:  [80-107] 91 (12/31 1100) Resp:  [7-32] 11 (12/31 1100) BP: (71-139)/(41-113) 96/52 (12/31 1100) SpO2:  [85 %-100 %] 100 % (12/31 1100) Weight:  [94.4 kg] 94.4 kg (12/31 0500) Last BM Date : 06/29/23  Weight change: Filed Weights   06/27/23 0500 06/28/23 0440 06/29/23 0500  Weight: 88.3 kg 88.3 kg 94.4 kg    Intake/Output:   Intake/Output Summary (Last 24 hours) at 06/29/2023 1129 Last data filed at 06/29/2023 1100 Gross per 24 hour  Intake 2895.97 ml  Output 4059.2 ml  Net -1163.23 ml      Physical Exam  General:  well appearing.  No respiratory difficulty HEENT: +cortrak Neck: supple. JVD flat. Carotids 2+ bilat; no bruits. No lymphadenopathy or thyromegaly appreciated. Swann RIJ. LIJ HD cath Cor: PMI nondisplaced. Regular rate & rhythm. No rubs, gallops or murmurs. Lungs: clear Abdomen: soft, nontender, nondistended. No hepatosplenomegaly. No bruits or  masses. Good bowel sounds. Extremities: no cyanosis, clubbing, rash, edema. RF impella GU: +foley Neuro: alert & oriented x 3, cranial nerves grossly intact. moves all 4 extremities w/o difficulty. Affect pleasant.   Telemetry   NSR 90s (Personally reviewed)    Labs    CBC Recent Labs    06/28/23 0408 06/29/23 0505  WBC 16.5* 14.1*  HGB 7.9* 7.6*  HCT 22.7* 21.9*  MCV 81.9 83.9  PLT 169 141*   Basic Metabolic Panel Recent Labs    87/69/75 1553 06/29/23 0505  NA 131* 134*  K 3.6 3.7  CL 100 103  CO2 20* 24  GLUCOSE 197* 225*  BUN 52* 37*  CREATININE 4.66* 3.44*  CALCIUM  8.0* 7.8*  MG  --  2.2  PHOS 2.8 2.8   Liver Function Tests Recent Labs    06/28/23 0408 06/28/23 1553 06/29/23 0505  AST 52*  --  39  ALT 28  --  24  ALKPHOS 44  --  43  BILITOT 0.6  --  0.7  PROT 5.8*  --  5.5*  ALBUMIN  2.8* 2.8* 2.6*   No results for input(s): LIPASE, AMYLASE in the last 72 hours. Cardiac Enzymes No results for input(s): CKTOTAL, CKMB, CKMBINDEX, TROPONINI in the last 72 hours.  BNP: BNP (last 3 results) No results for input(s): BNP in the last 8760 hours.  ProBNP (last 3 results) No results for input(s): PROBNP  in the last 8760 hours.   D-Dimer No results for input(s): DDIMER in the last 72 hours. Hemoglobin A1C No results for input(s): HGBA1C in the last 72 hours. Fasting Lipid Panel No results for input(s): CHOL, HDL, LDLCALC, TRIG, CHOLHDL, LDLDIRECT in the last 72 hours.  Thyroid  Function Tests No results for input(s): TSH, T4TOTAL, T3FREE, THYROIDAB in the last 72 hours.  Invalid input(s): FREET3  Other results:   Imaging    DG Abd Portable 1V Result Date: 06/28/2023 CLINICAL DATA:  Enteric catheter placement EXAM: PORTABLE ABDOMEN - 1 VIEW COMPARISON:  06/28/2023 FINDINGS: Frontal view of the lower chest and upper abdomen demonstrates enteric catheter passing below diaphragm, tip coiled over the  gastric fundus. Stable position of the flow directed central venous catheter tip overlying the right pulmonary artery. Central venous catheter again seen tip projecting over the superior vena cava. Impella device again noted, unchanged with distal margin overlying the left ventricular apex. IMPRESSION: 1. Enteric catheter tip projecting over the gastric fundus. 2. Remaining support devices unchanged since prior exam. Electronically Signed   By: Ozell Daring M.D.   On: 06/28/2023 16:17     Medications:   Scheduled Medications:  aspirin  EC  81 mg Oral Daily   atorvastatin   80 mg Oral Daily   Chlorhexidine  Gluconate Cloth  6 each Topical Daily   feeding supplement (PROSource TF20)  60 mL Per Tube TID   metoCLOPramide  (REGLAN ) injection  10 mg Intravenous Q8H   multivitamin  1 tablet Per Tube BID   mouth rinse  15 mL Mouth Rinse 4 times per day   pantoprazole  (PROTONIX ) IV  40 mg Intravenous Q24H   polyethylene glycol  17 g Oral Daily   senna-docusate  1 tablet Oral BID   sodium chloride  flush  3 mL Intravenous Q12H    Infusions:   prismasol  BGK 4/2.5 500 mL/hr at 06/29/23 1006    prismasol  BGK 4/2.5 400 mL/hr at 06/29/23 0853   amiodarone  30 mg/hr (06/29/23 1120)   cefTRIAXone  (ROCEPHIN )  IV 2 g (06/29/23 1004)   feeding supplement (VITAL 1.5 CAL) 40 mL/hr at 06/29/23 1100   furosemide  Stopped (06/29/23 0719)   heparin  1,800 Units/hr (06/29/23 1100)   insulin  5 Units/hr (06/29/23 1100)   iron  sucrose 500 mg (06/29/23 1119)   milrinone  0.25 mcg/kg/min (06/29/23 1100)   norepinephrine  (LEVOPHED ) Adult infusion 2 mcg/min (06/29/23 1100)   prismasol  BGK 4/2.5 2,000 mL/hr at 06/29/23 1000   promethazine  (PHENERGAN ) injection (IM or IVPB) 12.5 mg (06/27/23 2030)   sodium bicarbonate  25 mEq (Impella PURGE) in dextrose  5 % 1000 mL bag      PRN Medications: acetaminophen , ALPRAZolam , haloperidol  lactate, heparin , ondansetron  (ZOFRAN ) IV, mouth rinse, mouth rinse, promethazine  (PHENERGAN )  injection (IM or IVPB), sodium chloride , sodium chloride  flush  Assessment/Plan  1. CAD: Late presenting anterior/inferior STEMI/CAD.  70% mid LAD, 100% m-dLAD, 90% pRCA, 90% mRCA, 70% LPAV.  Disease not amenable to PCI, failed attempt to cross LAD occlusion x 3.  No chest pain. Medical management.  - ASA + statin   - no ? blocker w/ shock  2. Acute Systolic Heart Failure/Cardiogenic shock: Ischemic cardiomyopathy, limited echo 12/29 with EF 25% with LAD territory WMAs, LV thrombus, RV normal. Impella at 4.0 cm in good position. Worry that he has nonviable LAD-territory myocardium. Initial RHC with mean RA 6, mean PCWP 20, CI 1.93.  - Impella CP placed, currently at P8. On milrinone  0.25 and NE 2. CI 1.88 and CO-OX 57%. CVP  5. Nephrology following, now on CRRT.  - On heparin  gtt with Impella and LV thrombus.  - If we cannot wean off Impella, will need to consider LVAD but AKI and Jehovah's Witness could make this difficult. 3. AKI: SCr baseline 1.3. Peaked 6.43 12/30.  Suspect ATN in setting of cardiogenic shock.     - Maintain CO and MAP.  - Stop IV lasix  - Nephrology on board. Now on CRRT.  4. Elevated LFTs: Suspect shock liver, trending down.  5. ID: Tmax 100F overnight with WBCs down slightly, 16.5>14.1K.  PCT 0.25.  MRSA PCR negative.  - Covering with ceftriaxone  empirically.  6. Type 2DM: On insulin  gtt - Transition to basal and SSI. Discussed with PharmD. 7. Anemia: Blood loss at Impella site initially, seems to have stopped.  Hgb 9.7 => 7.9=> 7.6 last few days.  Jehovah's Witness so no blood products.  - he would NOT accept any blood products even in a life and death situation.  - Getting IV iron  8. LV thrombus: On heparin  gtt 9. Atrial fibrillation: Noted on 12/29. Converted with amiodarone  gtt. Continue amiodarone  at 30/hr. - On heparin  gtt, send HIT panel (mild plt drop, want to stay ahead) 10. FEN: Tube feeds via Cortrack ongoing due to poor appetite/nausea.  Doing better today,  on Reglan , now eating some.   Length of Stay: 4  Beckey LITTIE Coe, NP  06/29/2023, 11:29 AM  Advanced Heart Failure Team Pager [phone] (M-F; 7a - 5p)  Please contact [facility] for night-coverage after hours (5p -7a ) and weekends on [email or web address]  Patient seen with NP, agree with the above note.   He is on CVVH, I/Os net negative 780 over the last day.  CVP is 5-6 on my read, CI 2.6 off Swan.  He is on NE 3, milrinone  0.25, Impella CP at P8.  No Impella alarms, good flow.  Not making much urine.   On insulin  gtt, stopping today.   Nausea improved, on Reglan .   General: NAD Neck: No JVD, no thyromegaly or thyroid  nodule.  Lungs: Clear to auscultation bilaterally with normal respiratory effort. CV: Nondisplaced PMI.  Heart regular S1/S2, no S3/S4, no murmur.  No peripheral edema.   Abdomen: Soft, nontender, no hepatosplenomegaly, no distention.  Skin: Intact without lesions or rashes.  Neurologic: Drowsy but answers questions.  Psych: Normal affect. Extremities: No clubbing or cyanosis.  HEENT: Normal.   Patient remains on support with Impella CP, NE 3, milrinone  0.25.  Adequate CI at 2.6, CVP 5.  - Run CVVH even to slightly negative.  - Continue current support.  Will repeat echo tomorrow to see if patient still has LV thrombus.  - Heparin  gtt for AF and Impella. LDH is trending down.  - He does not appear to be a candidate for percutaneous or surgical revascularization.  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  Following for renal recovery now on CVVH and maintaining adequate CI.  If LV thrombus resolves, would consider upgrading from Impella CP to Impella 5.5 given likely need for prolonged support.   Hgb slowly trending down, getting IV Fe.  With platelets lower, will check HIT.   CRITICAL CARE Performed by: Ezra Shuck  Total critical care time: 40 minutes  Critical care time was exclusive of separately billable procedures and  treating other patients.  Critical care was necessary to treat or prevent imminent or life-threatening deterioration.  Critical care was time spent personally by me on the following  activities: development of treatment plan with patient and/or surrogate as well as nursing, discussions with consultants, evaluation of patient's response to treatment, examination of patient, obtaining history from patient or surrogate, ordering and performing treatments and interventions, ordering and review of laboratory studies, ordering and review of radiographic studies, pulse oximetry and re-evaluation of patient's condition.  Ezra Shuck 06/29/2023 1:16 PM

## 2023-06-29 NOTE — Progress Notes (Signed)
 [facility] Progress Note   Assessment/ Plan:   Assessment/Plan: 52 year old male with hypertension, diabetes, and mild CKD.  He now presents late after MI and is suffering from cardiogenic shock complicated by acute renal failure 1.Renal-does have some mild CKD at baseline.  Also at least 1 episode in the past of AKI.  Urinalyses in the past have shown proteinuria-also for some reason had 1 done on 12/13 that showed some microscopic hematuria..  Will check urine here today for completeness sake but suspect that major etiology of his acute kidney injury is hemodynamic in nature in the setting of cardiogenic shock.  Has an Impella in place and is on norepinephrine  and milrinone  to reinforce blood pressure and kidney perfusion.   - did not respond to Lasix  challenge - needs CRRT - all 4K bath, no heparin  as he is on systemic gtt - no blood products, OK with ESA, add iron  today too  2. Hypertension/volume  -is hypotensive and slightly volume overloaded.  Medical management with milrinone  and pressor support.  Lasix  challenge as above 3.  Cardiovascular-significant issue with multivessel CAD that was unable to be intervened upon.  Continued cardiac dysfunction with Impella, milrinone  and norepinephrine .  Per cardiology  4. Anemia  -supportive care at this time  - start Aranesp   -IV iron   5.  R foot ulcer  - tells me that he has arterial dopplers scheduled as OP--> probably should do them here when feasible  Subjective:    Tolerating CRRT well   Objective:   BP (!) 96/52   Pulse 91   Temp 98.8 F (37.1 C)   Resp 11   Ht 6' 3 (1.905 m)   Wt 94.4 kg Comment: reweighed multiple times?  SpO2 100%   BMI 26.01 kg/m   Intake/Output Summary (Last 24 hours) at 06/29/2023 1113 Last data filed at 06/29/2023 1100 Gross per 24 hour  Intake 2895.97 ml  Output 4059.2 ml  Net -1163.23 ml   Weight change: 6.1 kg  Physical Exam: Gen: lying in bed, appears ill CVS: RRR Resp  clear anteriorly Abd: mildly distended Ext: 1+ LE edema ACCESS; L internal jugular nontunneled HD cath  Imaging: DG Abd Portable 1V Result Date: 06/28/2023 CLINICAL DATA:  Enteric catheter placement EXAM: PORTABLE ABDOMEN - 1 VIEW COMPARISON:  06/28/2023 FINDINGS: Frontal view of the lower chest and upper abdomen demonstrates enteric catheter passing below diaphragm, tip coiled over the gastric fundus. Stable position of the flow directed central venous catheter tip overlying the right pulmonary artery. Central venous catheter again seen tip projecting over the superior vena cava. Impella device again noted, unchanged with distal margin overlying the left ventricular apex. IMPRESSION: 1. Enteric catheter tip projecting over the gastric fundus. 2. Remaining support devices unchanged since prior exam. Electronically Signed   By: Ozell Daring M.D.   On: 06/28/2023 16:17   DG CHEST PORT 1 VIEW Result Date: 06/28/2023 CLINICAL DATA:  Congestive heart failure. EXAM: PORTABLE CHEST 1 VIEW COMPARISON:  June 27, 2023. FINDINGS: Stable cardiomediastinal silhouette. Impella device and Swan-Ganz catheter are unchanged. Left internal jugular catheter is unchanged. Lungs are clear. Bony thorax is unremarkable. IMPRESSION: Stable support apparatus.  No acute abnormality seen. Electronically Signed   By: Lynwood Landy Raddle M.D.   On: 06/28/2023 13:14   DG Chest Port 1 View Result Date: 06/27/2023 CLINICAL DATA:  [id number] Encounter for central line placement [id number] EXAM: PORTABLE CHEST 1 VIEW COMPARISON:  Same day chest x-ray FINDINGS: Interval placement of left  internal jugular approach hemodialysis catheter with distal tip terminating at the level of the proximal right atrium. Right IJ approach pulmonary arterial catheter and inferior approach Impella device remain in place. Stable heart size. No focal airspace consolidation, pleural effusion, or pneumothorax. IMPRESSION: Interval placement of left internal  jugular approach hemodialysis catheter with distal tip terminating at the level of the proximal right atrium. No pneumothorax. Electronically Signed   By: Mabel Converse D.O.   On: 06/27/2023 15:34    Labs: BMET Recent Labs  Lab 06/26/23 0355 06/26/23 1502 06/27/23 0427 06/27/23 1537 06/28/23 0408 06/28/23 1553 06/29/23 0505  NA 134* 131* 132* 130* 135 131* 134*  K 3.7 3.8 3.9 4.0 3.7 3.6 3.7  CL 104 100 102 98 102 100 103  CO2 20* 20* 18* 18* 19* 20* 24  GLUCOSE 319* 295* 288* 337* 171* 197* 225*  BUN 47* 56* 58* 68* 71* 52* 37*  CREATININE 3.37* 4.06* 5.21* 5.87* 6.43* 4.66* 3.44*  CALCIUM  8.4* 8.2* 7.9* 8.2* 8.4* 8.0* 7.8*  PHOS  --   --   --   --   --  2.8 2.8   CBC Recent Labs  Lab 06/25/23 0822 06/25/23 1023 06/27/23 0427 06/27/23 1537 06/28/23 0408 06/29/23 0505  WBC 19.5*   < > 18.2* 15.8* 16.5* 14.1*  NEUTROABS 16.0*  --   --   --   --   --   HGB 12.4*   < > 8.6* 8.5* 7.9* 7.6*  HCT 37.4*   < > 24.5* 24.1* 22.7* 21.9*  MCV 87.2   < > 82.5 82.3 81.9 83.9  PLT 348   < > 193 195 169 141*   < > = values in this interval not displayed.    Medications:     aspirin  EC  81 mg Oral Daily   atorvastatin   80 mg Oral Daily   Chlorhexidine  Gluconate Cloth  6 each Topical Daily   feeding supplement (PROSource TF20)  60 mL Per Tube TID   metoCLOPramide  (REGLAN ) injection  10 mg Intravenous Q8H   multivitamin  1 tablet Per Tube BID   mouth rinse  15 mL Mouth Rinse 4 times per day   pantoprazole  (PROTONIX ) IV  40 mg Intravenous Q24H   polyethylene glycol  17 g Oral Daily   senna-docusate  1 tablet Oral BID   sodium chloride  flush  3 mL Intravenous Q12H    Almarie Bonine, MD 06/29/2023, 11:13 AM

## 2023-06-29 NOTE — Progress Notes (Signed)
 Nutrition Follow-up  DOCUMENTATION CODES:   Non-severe (moderate) malnutrition in context of chronic illness  INTERVENTION:   Tube Feeding via Cortrak:  Vital 1.5 at 20 ml/hr with goal of 65 ml/hr Continue to titrate by 10 mL q 8 hours until goal of 65 Pro-source TF20 60 mL TID TF at goal provides 2580 kcals, 165 g of protein and 1186 mL of free water  Encourage soft diet as tolerated  Continue protonix  and reglan    Continue Renal MVI, thiamine  NUTRITION DIAGNOSIS:   Moderate Malnutrition related to chronic illness as evidenced by mild fat depletion, mild muscle depletion, energy intake < 75% for > or equal to 1 month.  Being addressed via TF   GOAL:   Patient will meet greater than or equal to 90% of their needs  Progressing  MONITOR:   TF tolerance, Labs, Weight trends, Skin, PO intake  REASON FOR ASSESSMENT:   Consult Assessment of nutrition requirement/status, Poor PO (CRRT)  ASSESSMENT:   52 yo male admitted with NSTEMI and cardiogenic shock, CAD with failed PCI, Impella CP placed. PMH includes DM, HTN, GERD, HLD, GI ulcer  12/27 R/LHC, failed PCI, Impella CP placement, Transfer to [facility]. RHC consistent with LV dominant shock  12/30 CRRT initiated, Cortrak placed, TF initiated, reglan  and protonix  started  Remains on CRRT, Impella CP P8, Flow 3.4 L/min  Per RN, pt did not eat breakfast this AM, received vanilla yogurt but wanted strawberry and didn't like the fruit.   Started on Reglan  10 mg IV q 8 hours and Protonix  40 mg IV q 24 hours yesterday. Nausea improved today. Still having hiccups. Unclear if pt is still regurgitating as did not eat breakfast.  +large BM today after period of constipation  Tolerating Vital 1.5 via Cortrak, up to 40 ml/hr with goal of 65 ml/hr.   Remains on insulin  gtt, CBGs 187-246  Labs: sodium 134 (L), potassium 3.7 (wdl), phosphorus 2.8 (L), magnesium  2.2 (L) Meds: renal MVI, thiamine, reglan , senokot,  protonix   Diet Order:   Diet Order             DIET SOFT Room service appropriate? Yes with Assist; Fluid consistency: Thin; Fluid restriction: 1800 mL Fluid  Diet effective now                   EDUCATION NEEDS:   Education needs have been addressed  Skin:  Skin Assessment: Reviewed RN Assessment  Last BM:  12/31 +large type 7 after period of constipation  Height:   Ht Readings from Last 1 Encounters:  06/25/23 6' 3 (1.905 m)    Weight:   Wt Readings from Last 1 Encounters:  06/29/23 94.4 kg    BMI:  Body mass index is 26.01 kg/m.  Estimated Nutritional Needs:   Kcal:  2500-2700 kcals  Protein:  135-170 g  Fluid:  1.8 L    Betsey Finger MS, RDN, LDN, CNSC Registered Dietitian 3 Clinical Nutrition RD Inpatient Contact Info in Amion

## 2023-06-29 NOTE — Progress Notes (Signed)
   06/29/23 2307  BiPAP/CPAP/SIPAP  Reason BIPAP/CPAP not in use NG tube in place  BiPAP/CPAP /SiPAP Vitals  Temp 99.1 F (37.3 C)  Pulse Rate 94  Resp 15  SpO2 98 %  MEWS Score/Color  MEWS Score 1  MEWS Score Color Green   Pt ordered CPAP but has NG tube in place with cont feeds. Patient is resting well on RA at this time, sat 100%. RN will place on [place] if patient desat. Patient does not use CPAP at home and says that he has never had a sleep study.

## 2023-06-29 NOTE — Progress Notes (Addendum)
 PHARMACY - ANTICOAGULATION CONSULT NOTE  Pharmacy Consult for heparin  Indication:  Impella CP/apical thrombus  No Known Allergies  Patient Measurements: Height: 6' 3 (190.5 cm) Weight: 94.4 kg (208 lb 1.8 oz) (reweighed multiple times?) IBW/kg (Calculated) : 84.5 Heparin  Dosing Weight: 107 kg  Vital Signs: Temp: 98.4 F (36.9 C) (12/31 0900) Temp Source: Core (12/31 0800) BP: 91/70 (12/31 1000) Pulse Rate: 92 (12/31 1000)  Labs: Recent Labs    06/27/23 1537 06/28/23 0044 06/28/23 0408 06/28/23 0857 06/28/23 1553 06/28/23 1749 06/29/23 0505  HGB 8.5*  --  7.9*  --   --   --  7.6*  HCT 24.1*  --  22.7*  --   --   --  21.9*  PLT 195  --  169  --   --   --  141*  HEPARINUNFRC 0.10*   < >  --  0.16*  --  0.28* 0.43  CREATININE 5.87*  --  6.43*  --  4.66*  --  3.44*   < > = values in this interval not displayed.    Estimated Creatinine Clearance: 30 mL/min (A) (by C-G formula based on SCr of 3.44 mg/dL (H)).   Medical History: Past Medical History:  Diagnosis Date   Depression 1989   Diabetes mellitus without complication (HCC)    Gastroesophageal reflux disease 01/31/2014   HLD (hyperlipidemia) 03/12/2023   Hypertension    ST elevation myocardial infarction (STEMI) (HCC) 06/25/2023   Ulcer 2016     Assessment: 55 yoM admitted as late presenting STEMI s/p unsuccessful PCI and Impella CP placement. ECHO showing large apical thrombus. Pharmacy to manage heparin . Bicarbonate purge started. No AC PTA.  Heparin  level therapeutic at 0.43, CBC ok, no bleeding issues.  Goal of Therapy:  Heparin  level 0.3-0.5 units/ml Monitor platelets by anticoagulation protocol: Yes   Plan:  Heparin  1800 units/h Bicarbonate purge Check confirmatory heparin  level  ADDENDUM: heparin  level recheck remains therapeutic at 0.40. Continue current rate above and recheck in am.  Ozell Jamaica, PharmD, BCPS, [facility] Clinical Pharmacist [phone] Please check AMION for all [facility] Pharm[facility]  numbers 06/29/2023

## 2023-06-30 ENCOUNTER — Inpatient Hospital Stay (HOSPITAL_COMMUNITY): Payer: Medicaid Other

## 2023-06-30 DIAGNOSIS — R570 Cardiogenic shock: Secondary | ICD-10-CM | POA: Diagnosis not present

## 2023-06-30 DIAGNOSIS — Z95811 Presence of heart assist device: Secondary | ICD-10-CM

## 2023-06-30 LAB — COMPREHENSIVE METABOLIC PANEL
ALT: 24 U/L (ref 0–44)
AST: 37 U/L (ref 15–41)
Albumin: 2.5 g/dL — ABNORMAL LOW (ref 3.5–5.0)
Alkaline Phosphatase: 48 U/L (ref 38–126)
Anion gap: 8 (ref 5–15)
BUN: 26 mg/dL — ABNORMAL HIGH (ref 6–20)
CO2: 26 mmol/L (ref 22–32)
Calcium: 7.8 mg/dL — ABNORMAL LOW (ref 8.9–10.3)
Chloride: 99 mmol/L (ref 98–111)
Creatinine, Ser: 2.57 mg/dL — ABNORMAL HIGH (ref 0.61–1.24)
GFR, Estimated: 29 mL/min — ABNORMAL LOW (ref >60)
Glucose, Bld: 236 mg/dL — ABNORMAL HIGH (ref 70–99)
Potassium: 4.2 mmol/L (ref 3.5–5.1)
Sodium: 133 mmol/L — ABNORMAL LOW (ref 135–145)
Total Bilirubin: 0.7 mg/dL (ref 0.0–1.2)
Total Protein: 5.8 g/dL — ABNORMAL LOW (ref 6.5–8.1)

## 2023-06-30 LAB — GLUCOSE, CAPILLARY
Glucose-Capillary: 210 mg/dL — ABNORMAL HIGH (ref 70–99)
Glucose-Capillary: 223 mg/dL — ABNORMAL HIGH (ref 70–99)
Glucose-Capillary: 224 mg/dL — ABNORMAL HIGH (ref 70–99)
Glucose-Capillary: 239 mg/dL — ABNORMAL HIGH (ref 70–99)
Glucose-Capillary: 256 mg/dL — ABNORMAL HIGH (ref 70–99)
Glucose-Capillary: 284 mg/dL — ABNORMAL HIGH (ref 70–99)
Glucose-Capillary: 400 mg/dL — ABNORMAL HIGH (ref 70–99)
Glucose-Capillary: 404 mg/dL — ABNORMAL HIGH (ref 70–99)

## 2023-06-30 LAB — ECHOCARDIOGRAM LIMITED
Height: 75 in
Weight: 3269.86 [oz_av]

## 2023-06-30 LAB — RENAL FUNCTION PANEL
Albumin: 2.5 g/dL — ABNORMAL LOW (ref 3.5–5.0)
Albumin: 2.5 g/dL — ABNORMAL LOW (ref 3.5–5.0)
Anion gap: 7 (ref 5–15)
Anion gap: 8 (ref 5–15)
BUN: 24 mg/dL — ABNORMAL HIGH (ref 6–20)
BUN: 22 mg/dL — ABNORMAL HIGH (ref 6–20)
CO2: 25 mmol/L (ref 22–32)
CO2: 26 mmol/L (ref 22–32)
Calcium: 7.8 mg/dL — ABNORMAL LOW (ref 8.9–10.3)
Calcium: 8 mg/dL — ABNORMAL LOW (ref 8.9–10.3)
Chloride: 100 mmol/L (ref 98–111)
Chloride: 99 mmol/L (ref 98–111)
Creatinine, Ser: 2.47 mg/dL — ABNORMAL HIGH (ref 0.61–1.24)
Creatinine, Ser: 2.37 mg/dL — ABNORMAL HIGH (ref 0.61–1.24)
GFR, Estimated: 31 mL/min — ABNORMAL LOW (ref >60)
GFR, Estimated: 32 mL/min — ABNORMAL LOW (ref >60)
Glucose, Bld: 233 mg/dL — ABNORMAL HIGH (ref 70–99)
Glucose, Bld: 269 mg/dL — ABNORMAL HIGH (ref 70–99)
Phosphorus: 2.2 mg/dL — ABNORMAL LOW (ref 2.5–4.6)
Phosphorus: 2.1 mg/dL — ABNORMAL LOW (ref 2.5–4.6)
Potassium: 4.1 mmol/L (ref 3.5–5.1)
Potassium: 4.3 mmol/L (ref 3.5–5.1)
Sodium: 132 mmol/L — ABNORMAL LOW (ref 135–145)
Sodium: 133 mmol/L — ABNORMAL LOW (ref 135–145)

## 2023-06-30 LAB — COOXEMETRY PANEL
Carboxyhemoglobin: 2.4 % — ABNORMAL HIGH (ref 0.5–1.5)
Carboxyhemoglobin: 1.5 % (ref 0.5–1.5)
Methemoglobin: <0.7 % (ref 0.0–1.5)
Methemoglobin: <0.7 % (ref 0.0–1.5)
O2 Saturation: 57.6 %
O2 Saturation: 57.5 %
Total hemoglobin: 7.4 g/dL — ABNORMAL LOW (ref 12.0–16.0)
Total hemoglobin: 7.5 g/dL — ABNORMAL LOW (ref 12.0–16.0)

## 2023-06-30 LAB — HEPARIN INDUCED PLATELET AB (HIT ANTIBODY): Heparin Induced Plt Ab: 0.082 {OD_unit} (ref 0.000–0.400)

## 2023-06-30 LAB — CBC
HCT: 21.1 % — ABNORMAL LOW (ref 39.0–52.0)
Hemoglobin: 7.3 g/dL — ABNORMAL LOW (ref 13.0–17.0)
MCH: 29.1 pg (ref 26.0–34.0)
MCHC: 34.6 g/dL (ref 30.0–36.0)
MCV: 84.1 fL (ref 80.0–100.0)
Platelets: 119 10*3/uL — ABNORMAL LOW (ref 150–400)
RBC: 2.51 MIL/uL — ABNORMAL LOW (ref 4.22–5.81)
RDW: 13.5 % (ref 11.5–15.5)
WBC: 13.4 10*3/uL — ABNORMAL HIGH (ref 4.0–10.5)
nRBC: 1.5 % — ABNORMAL HIGH (ref 0.0–0.2)

## 2023-06-30 LAB — PHOSPHORUS
Phosphorus: 2.1 mg/dL — ABNORMAL LOW (ref 2.5–4.6)
Phosphorus: 2.1 mg/dL — ABNORMAL LOW (ref 2.5–4.6)

## 2023-06-30 LAB — MAGNESIUM
Magnesium: 2.4 mg/dL (ref 1.7–2.4)
Magnesium: 2.5 mg/dL — ABNORMAL HIGH (ref 1.7–2.4)

## 2023-06-30 LAB — HEPARIN LEVEL (UNFRACTIONATED)
Heparin Unfractionated: 0.46 [IU]/mL (ref 0.30–0.70)
Heparin Unfractionated: 0.47 [IU]/mL (ref 0.30–0.70)

## 2023-06-30 LAB — LACTATE DEHYDROGENASE: LDH: 731 U/L — ABNORMAL HIGH (ref 98–192)

## 2023-06-30 MED ORDER — INSULIN ASPART 100 UNIT/ML IJ SOLN
0.0000 [IU] | INTRAMUSCULAR | Status: DC
  Administered 2023-06-30: 7 [IU] via SUBCUTANEOUS
  Administered 2023-06-30: 11 [IU] via SUBCUTANEOUS
  Administered 2023-06-30: 7 [IU] via SUBCUTANEOUS
  Administered 2023-07-01 (×3): 11 [IU] via SUBCUTANEOUS

## 2023-06-30 MED ORDER — INSULIN ASPART 100 UNIT/ML IJ SOLN
5.0000 [IU] | INTRAMUSCULAR | Status: DC
  Administered 2023-06-30 – 2023-07-01 (×5): 5 [IU] via SUBCUTANEOUS

## 2023-06-30 NOTE — Progress Notes (Addendum)
 Patient ID: Gabriel Shaw, male   DOB: Nov 25, 1970, 53 y.o.   MRN: [id number]     Advanced Heart Failure Rounding Note  Cardiologist: None   Chief Complaint: Cardiogenic shock  Subjective:    Currently on 0.25 milrinone  + NE 2. UOP 125 cc last 24 hrs.  Now on CVVH, I/Os net 741 last day.   Afebrile, WBCs trending down 13.4,  BC X 2 negative. On ceftriaxone , stop after today.   Impella CP: P8 Flow 3.4 L/min  Swan: CVP 10 PA 31/19 CI 2.5 Co-ox 58% LDH 1261 => 1012 => 966 => 761 => 731  Hgb slowly drifting down, 7.9>7.6>7.3 this am.  Has had Venofer  and Aranesp .   Platelets slowly trending down, 119 today.  HIT sent yesterday.   Maintaining SR with amiodarone  gtt.   Nausea is improved.    Objective:   Weight Range: 92.7 kg Body mass index is 25.54 kg/m.   Vital Signs:   Temp:  [98.8 F (37.1 C)-99.5 F (37.5 C)] 99.3 F (37.4 C) (01/01 0830) Pulse Rate:  [72-98] 86 (01/01 0830) Resp:  [6-28] 15 (01/01 0830) BP: (73-130)/(38-100) 88/72 (01/01 0830) SpO2:  [78 %-100 %] 98 % (01/01 0830) Weight:  [92.7 kg] 92.7 kg (01/01 0500) Last BM Date : 06/29/23  Weight change: Filed Weights   06/28/23 0440 06/29/23 0500 06/30/23 0500  Weight: 88.3 kg 94.4 kg 92.7 kg    Intake/Output:   Intake/Output Summary (Last 24 hours) at 06/30/2023 0904 Last data filed at 06/30/2023 0800 Gross per 24 hour  Intake 4038.79 ml  Output 4734.6 ml  Net -695.81 ml      Physical Exam   General: NAD Neck: Catheters in IJs, no thyromegaly or thyroid  nodule.  Lungs: Clear to auscultation bilaterally with normal respiratory effort. CV: Nondisplaced PMI.  Heart regular S1/S2, no S3/S4, no murmur.  No peripheral edema.    Abdomen: Soft, nontender, no hepatosplenomegaly, no distention.  Skin: Intact without lesions or rashes.  Neurologic: Alert and oriented x 3.  Psych: Normal affect. Extremities: No clubbing or cyanosis.  HEENT: Normal.   Telemetry   NSR 90s (Personally reviewed)     Labs    CBC Recent Labs    06/29/23 0505 06/30/23 0417  WBC 14.1* 13.4*  HGB 7.6* 7.3*  HCT 21.9* 21.1*  MCV 83.9 84.1  PLT 141* 119*   Basic Metabolic Panel Recent Labs    87/68/75 0505 06/29/23 1617 06/30/23 0417  NA 134* 133* 133*  132*  K 3.7 4.0 4.2  4.1  CL 103 101 99  100  CO2 24 23 26  25   GLUCOSE 225* 206* 236*  233*  BUN 37* 28* 26*  24*  CREATININE 3.44* 2.80* 2.57*  2.47*  CALCIUM  7.8* 7.8* 7.8*  7.8*  MG 2.2  --  2.4  PHOS 2.8 2.3* 2.1*  2.2*   Liver Function Tests Recent Labs    06/29/23 0505 06/29/23 1617 06/30/23 0417  AST 39  --  37  ALT 24  --  24  ALKPHOS 43  --  48  BILITOT 0.7  --  0.7  PROT 5.5*  --  5.8*  ALBUMIN  2.6* 2.6* 2.5*  2.5*   No results for input(s): LIPASE, AMYLASE in the last 72 hours. Cardiac Enzymes No results for input(s): CKTOTAL, CKMB, CKMBINDEX, TROPONINI in the last 72 hours.  BNP: BNP (last 3 results) No results for input(s): BNP in the last 8760 hours.  ProBNP (last 3 results) No  results for input(s): PROBNP in the last 8760 hours.   D-Dimer No results for input(s): DDIMER in the last 72 hours. Hemoglobin A1C No results for input(s): HGBA1C in the last 72 hours. Fasting Lipid Panel No results for input(s): CHOL, HDL, LDLCALC, TRIG, CHOLHDL, LDLDIRECT in the last 72 hours.  Thyroid  Function Tests No results for input(s): TSH, T4TOTAL, T3FREE, THYROIDAB in the last 72 hours.  Invalid input(s): FREET3  Other results:   Imaging    No results found.    Medications:   Scheduled Medications:  aspirin  EC  81 mg Oral Daily   atorvastatin   80 mg Oral Daily   Chlorhexidine  Gluconate Cloth  6 each Topical Daily   feeding supplement (PROSource TF20)  60 mL Per Tube TID   insulin  aspart  0-15 Units Subcutaneous Q4H   insulin  aspart  3 Units Subcutaneous Q4H   insulin  glargine-yfgn  20 Units Subcutaneous Daily   metoCLOPramide  (REGLAN ) injection   10 mg Intravenous Q8H   multivitamin  1 tablet Per Tube BID   mouth rinse  15 mL Mouth Rinse 4 times per day   pantoprazole  (PROTONIX ) IV  40 mg Intravenous Q24H   polyethylene glycol  17 g Oral Daily   senna-docusate  1 tablet Oral BID   sodium chloride  flush  3 mL Intravenous Q12H    Infusions:   prismasol  BGK 4/2.5 500 mL/hr at 06/30/23 0637    prismasol  BGK 4/2.5 400 mL/hr at 06/29/23 0853   amiodarone  30 mg/hr (06/30/23 0800)   feeding supplement (VITAL 1.5 CAL) 65 mL/hr at 06/30/23 0800   heparin  1,800 Units/hr (06/30/23 0800)   milrinone  0.25 mcg/kg/min (06/30/23 0800)   norepinephrine  (LEVOPHED ) Adult infusion 2 mcg/min (06/30/23 0800)   prismasol  BGK 4/2.5 2,000 mL/hr at 06/30/23 0615   promethazine  (PHENERGAN ) injection (IM or IVPB) 12.5 mg (06/27/23 2030)   sodium bicarbonate  25 mEq (Impella PURGE) in dextrose  5 % 1000 mL bag      PRN Medications: acetaminophen , ALPRAZolam , chlorproMAZINE  (THORAZINE ) injection, haloperidol  lactate, heparin , ondansetron  (ZOFRAN ) IV, mouth rinse, mouth rinse, promethazine  (PHENERGAN ) injection (IM or IVPB), sodium chloride , sodium chloride  flush  Assessment/Plan   1. CAD: Late presenting anterior/inferior STEMI/CAD.  70% mid LAD, 100% m-dLAD, 90% pRCA, 90% mRCA, 70% LPAV.  Disease not amenable to PCI, failed attempt to cross LAD occlusion x 3.  No chest pain. Medical management.  - ASA + statin   - no ? blocker w/ shock  2. Acute Systolic Heart Failure/Cardiogenic shock: Ischemic cardiomyopathy, limited echo 12/29 with EF 25% with LAD territory WMAs, LV thrombus, RV normal. Worry that he has nonviable LAD-territory myocardium. Initial RHC with mean RA 6, mean PCWP 20, CI 1.93. Impella CP placed, currently at P8. On milrinone  0.25 and NE 2. CI 2.5 and CO-OX 58%. CVP 10 today, have been running CVVH even to mildly negative.  - On heparin  gtt with Impella and LV thrombus.  - This morning, will increase milrinone  to 0.375 and try to start weaning  the Impella, decrease to P6.  - Aim for gently negative CVVH today, net negative 50 cc/hr UF.  - If we cannot wean off Impella, will need to consider Impella 5.5 to allow mobility.  However, more advanced options (LVAD) are going to be severely limited by AKI on HD and [employer] not accepting blood products.   3. AKI: SCr baseline 1.3.  Suspect ATN in setting of cardiogenic shock.  Now on CVVH, awaiting renal recovery.   - Maintain CO  and MAP.  - Aim for gently negative UF today, 50 cc/hr.  4. Elevated LFTs: Suspect shock liver, trending down.  5. ID: Now afebrile with WBCs trending down.  - Covering with ceftriaxone  empirically, completes today.  6. Type 2DM: SSI. 7. Anemia: Blood loss at Impella site initially, seems to have stopped.  Hgb 9.7 => 7.9=> 7.6 => 7.3. Je[employer] Witness so no blood products.  - he would NOT accept any blood products even in a life and death situation.  - Has had IV Fe and Aranesp .  8. LV thrombus: On heparin  gtt 9. Atrial fibrillation: Noted on 12/29. Converted with amiodarone  gtt. Continue amiodarone  at 30/hr. - On heparin  gtt 10. FEN: Tube feeds via Cortrack ongoing due to poor appetite/nausea.  Doing better today, on Reglan , now eating some.  11. Thrombocytopenia: May be due to Impella.  Reviewed trend with pharmacist.  HIT panel has been sent.   - If platelets continue to drop tomorrow, will stop heparin  and start bivalirudin .  - Check Impella position by echo today => Impella position is ok, still has apical thrombus.   CRITICAL CARE Performed by: Ezra Shuck  Total critical care time: 40 minutes  Critical care time was exclusive of separately billable procedures and treating other patients.  Critical care was necessary to treat or prevent imminent or life-threatening deterioration.  Critical care was time spent personally by me on the following activities: development of treatment plan with patient and/or surrogate as well as nursing,  discussions with consultants, evaluation of patient's response to treatment, examination of patient, obtaining history from patient or surrogate, ordering and performing treatments and interventions, ordering and review of laboratory studies, ordering and review of radiographic studies, pulse oximetry and re-evaluation of patient's condition.  Ezra Shuck 06/30/2023 9:04 AM

## 2023-06-30 NOTE — Progress Notes (Signed)
  [facility] Progress Note   Assessment/ Plan:   Assessment/Plan: 53 year old male with hypertension, diabetes, and mild CKD.  He now presents late after MI and is suffering from cardiogenic shock complicated by acute renal failure 1.Renal-does have some mild CKD at baseline.  Also at least 1 episode in the past of AKI.  Urinalyses in the past have shown proteinuria-also for some reason had 1 done on 12/13 that showed some microscopic hematuria..  Will check urine here today for completeness sake but suspect that major etiology of his acute kidney injury is hemodynamic in nature in the setting of cardiogenic shock.  Has an Impella in place and is on norepinephrine  and milrinone  to reinforce blood pressure and kidney perfusion.   - did not respond to Lasix  challenge - needs CRRT - all 4K bath, no heparin  as he is on systemic gtt - gentle net neg - no blood products, on iron  and ESA 2. Hypertension/volume  -UF as tolerated 3.  Cardiovascular-significant issue with multivessel CAD that was unable to be intervened upon.  - impella, milrinone , norepi - per AHF, going to wean milrinone  today  4. Anemia  -supportive care at this time  - start Aranesp   -IV iron   5.  R foot ulcer  - tells me that he has arterial dopplers scheduled as OP--> probably should do them here when feasible  6.  LV thrombus - hep gtt  Subjective:    Just got some Xanax  so resting quietly. Per RN was resting before   Objective:   BP (!) 88/72   Pulse 86   Temp 99.3 F (37.4 C)   Resp 15   Ht 6' 3 (1.905 m)   Wt 92.7 kg   SpO2 98%   BMI 25.54 kg/m   Intake/Output Summary (Last 24 hours) at 06/30/2023 9070 Last data filed at 06/30/2023 0900 Gross per 24 hour  Intake 4204.03 ml  Output 4754.6 ml  Net -550.57 ml   Weight change: -1.7 kg  Physical Exam: Gen: lying in bed, appears ill CVS: RRR Resp clear anteriorly Abd: mildly distended Ext: 1+ LE edema ACCESS; L internal jugular  nontunneled HD cath  Imaging: DG Abd Portable 1V Result Date: 06/28/2023 CLINICAL DATA:  Enteric catheter placement EXAM: PORTABLE ABDOMEN - 1 VIEW COMPARISON:  06/28/2023 FINDINGS: Frontal view of the lower chest and upper abdomen demonstrates enteric catheter passing below diaphragm, tip coiled over the gastric fundus. Stable position of the flow directed central venous catheter tip overlying the right pulmonary artery. Central venous catheter again seen tip projecting over the superior vena cava. Impella device again noted, unchanged with distal margin overlying the left ventricular apex. IMPRESSION: 1. Enteric catheter tip projecting over the gastric fundus. 2. Remaining support devices unchanged since prior exam. Electronically Signed   By: Ozell Daring M.D.   On: 06/28/2023 16:17    Labs: BMET Recent Labs  Lab 06/27/23 0427 06/27/23 1537 06/28/23 0408 06/28/23 1553 06/29/23 0505 06/29/23 1617 06/30/23 0417  NA 132* 130* 135 131* 134* 133* 133*  132*  K 3.9 4.0 3.7 3.6 3.7 4.0 4.2  4.1  CL 102 98 102 100 103 101 99  100  CO2 18* 18* 19* 20* 24 23 26  25   GLUCOSE 288* 337* 171* 197* 225* 206* 236*  233*  BUN 58* 68* 71* 52* 37* 28* 26*  24*  CREATININE 5.21* 5.87* 6.43* 4.66* 3.44* 2.80* 2.57*  2.47*  CALCIUM  7.9* 8.2* 8.4* 8.0* 7.8* 7.8* 7.8*  7.8*  PHOS  --   --   --  2.8 2.8 2.3* 2.1*  2.2*   CBC Recent Labs  Lab 06/25/23 0822 06/25/23 1023 06/27/23 1537 06/28/23 0408 06/29/23 0505 06/30/23 0417  WBC 19.5*   < > 15.8* 16.5* 14.1* 13.4*  NEUTROABS 16.0*  --   --   --   --   --   HGB 12.4*   < > 8.5* 7.9* 7.6* 7.3*  HCT 37.4*   < > 24.1* 22.7* 21.9* 21.1*  MCV 87.2   < > 82.3 81.9 83.9 84.1  PLT 348   < > 195 169 141* 119*   < > = values in this interval not displayed.    Medications:     aspirin  EC  81 mg Oral Daily   atorvastatin   80 mg Oral Daily   Chlorhexidine  Gluconate Cloth  6 each Topical Daily   feeding supplement (PROSource TF20)  60 mL  Per Tube TID   insulin  aspart  0-15 Units Subcutaneous Q4H   insulin  aspart  3 Units Subcutaneous Q4H   insulin  glargine-yfgn  20 Units Subcutaneous Daily   metoCLOPramide  (REGLAN ) injection  10 mg Intravenous Q8H   multivitamin  1 tablet Per Tube BID   mouth rinse  15 mL Mouth Rinse 4 times per day   pantoprazole  (PROTONIX ) IV  40 mg Intravenous Q24H   polyethylene glycol  17 g Oral Daily   senna-docusate  1 tablet Oral BID   sodium chloride  flush  3 mL Intravenous Q12H    Almarie Bonine, MD 06/30/2023, 9:29 AM

## 2023-06-30 NOTE — Progress Notes (Signed)
 PHARMACY - ANTICOAGULATION CONSULT NOTE  Pharmacy Consult for heparin  Indication:  Impella CP/apical thrombus  No Known Allergies  Patient Measurements: Height: 6' 3 (190.5 cm) Weight: 92.7 kg (204 lb 5.9 oz) IBW/kg (Calculated) : 84.5 Heparin  Dosing Weight: 107 kg  Vital Signs: Temp: 98.1 F (36.7 C) (01/01 1815) Temp Source: Core (01/01 0800) BP: 105/69 (01/01 1815) Pulse Rate: 90 (01/01 1815)  Labs: Recent Labs    06/28/23 0408 06/28/23 0857 06/29/23 0505 06/29/23 1114 06/29/23 1617 06/30/23 0417 06/30/23 1616  HGB 7.9*  --  7.6*  --   --  7.3*  --   HCT 22.7*  --  21.9*  --   --  21.1*  --   PLT 169  --  141*  --   --  119*  --   HEPARINUNFRC  --    < > 0.43 0.40  --  0.46 0.47  CREATININE 6.43*   < > 3.44*  --  2.80* 2.57*  2.47* 2.37*   < > = values in this interval not displayed.    Estimated Creatinine Clearance: 43.6 mL/min (A) (by C-G formula based on SCr of 2.37 mg/dL (H)).   Medical History: Past Medical History:  Diagnosis Date   Depression 1989   Diabetes mellitus without complication (HCC)    Gastroesophageal reflux disease 01/31/2014   HLD (hyperlipidemia) 03/12/2023   Hypertension    ST elevation myocardial infarction (STEMI) (HCC) 06/25/2023   Ulcer 2016     Assessment: Gabriel Shaw admitted as late presenting STEMI s/p unsuccessful PCI and Impella CP placement. ECHO showing large apical thrombus. Pharmacy to manage heparin . Bicarbonate purge started. No AC PTA.  Heparin  level therapeutic at 0.47 on heparin  dripo rate 1800 uts/hr.  No bleeding issues, h/h low stable , pltc slightly lower- watch. LDH trending down   Goal of Therapy:  Heparin  level 0.3-0.5 units/ml Monitor platelets by anticoagulation protocol: Yes   Plan:  Continue heparin  1800 units/h Bicarbonate purge Q12h heparin  level, CBC Monitor s/s bleeding    Olam Chalk Pharm.D. CPP, BCPS Clinical Pharmacist [phone] 06/30/2023 6:23 PM

## 2023-06-30 NOTE — Plan of Care (Signed)
  Problem: Education: Goal: Knowledge of General Education information will improve Description: Including pain rating scale, medication(s)/side effects and non-pharmacologic comfort measures Outcome: Progressing   Problem: Health Behavior/Discharge Planning: Goal: Ability to manage health-related needs will improve Outcome: Progressing   Problem: Clinical Measurements: Goal: Will remain free from infection Outcome: Progressing Goal: Diagnostic test results will improve Outcome: Progressing Goal: Respiratory complications will improve Outcome: Progressing Goal: Cardiovascular complication will be avoided Outcome: Progressing   Problem: Nutrition: Goal: Adequate nutrition will be maintained Outcome: Progressing   Problem: Coping: Goal: Level of anxiety will decrease Outcome: Progressing   Problem: Elimination: Goal: Will not experience complications related to bowel motility Outcome: Progressing Goal: Will not experience complications related to urinary retention Outcome: Progressing   Problem: Pain Management: Goal: General experience of comfort will improve Outcome: Progressing

## 2023-06-30 NOTE — Progress Notes (Addendum)
 PHARMACY - ANTICOAGULATION CONSULT NOTE  Pharmacy Consult for heparin  Indication:  Impella CP/apical thrombus  No Known Allergies  Patient Measurements: Height: 6' 3 (190.5 cm) Weight: 92.7 kg (204 lb 5.9 oz) IBW/kg (Calculated) : 84.5 Heparin  Dosing Weight: 107 kg  Vital Signs: Temp: 99.1 F (37.3 C) (01/01 0645) Temp Source: Core (01/01 0400) BP: 88/70 (01/01 0645) Pulse Rate: 89 (01/01 0645)  Labs: Recent Labs    06/28/23 0408 06/28/23 0857 06/29/23 0505 06/29/23 1114 06/29/23 1617 06/30/23 0417  HGB 7.9*  --  7.6*  --   --  7.3*  HCT 22.7*  --  21.9*  --   --  21.1*  PLT 169  --  141*  --   --  119*  HEPARINUNFRC  --    < > 0.43 0.40  --  0.46  CREATININE 6.43*   < > 3.44*  --  2.80* 2.57*  2.47*   < > = values in this interval not displayed.    Estimated Creatinine Clearance: 40.2 mL/min (A) (by C-G formula based on SCr of 2.57 mg/dL (H)).   Medical History: Past Medical History:  Diagnosis Date   Depression 1989   Diabetes mellitus without complication (HCC)    Gastroesophageal reflux disease 01/31/2014   HLD (hyperlipidemia) 03/12/2023   Hypertension    ST elevation myocardial infarction (STEMI) (HCC) 06/25/2023   Ulcer 2016     Assessment: 46 yoM admitted as late presenting STEMI s/p unsuccessful PCI and Impella CP placement. ECHO showing large apical thrombus. Pharmacy to manage heparin . Bicarbonate purge started. No AC PTA.  Heparin  level therapeutic at 0.46, CBC ok, no bleeding issues.  Goal of Therapy:  Heparin  level 0.3-0.5 units/ml Monitor platelets by anticoagulation protocol: Yes   Plan:  Continue heparin  1800 units/h Bicarbonate purge Q12h heparin  level, CBC  Maurilio Fila, PharmD Clinical Pharmacist 06/30/2023  6:48 AM

## 2023-06-30 DEATH — deceased

## 2023-07-01 ENCOUNTER — Inpatient Hospital Stay (HOSPITAL_COMMUNITY): Payer: Medicaid Other

## 2023-07-01 DIAGNOSIS — R570 Cardiogenic shock: Secondary | ICD-10-CM | POA: Diagnosis not present

## 2023-07-01 LAB — LACTATE DEHYDROGENASE: LDH: 687 U/L — ABNORMAL HIGH (ref 98–192)

## 2023-07-01 LAB — CBC
HCT: 21.7 % — ABNORMAL LOW (ref 39.0–52.0)
Hemoglobin: 7.4 g/dL — ABNORMAL LOW (ref 13.0–17.0)
MCH: 29.4 pg (ref 26.0–34.0)
MCHC: 34.1 g/dL (ref 30.0–36.0)
MCV: 86.1 fL (ref 80.0–100.0)
Platelets: 120 10*3/uL — ABNORMAL LOW (ref 150–400)
RBC: 2.52 MIL/uL — ABNORMAL LOW (ref 4.22–5.81)
RDW: 13.9 % (ref 11.5–15.5)
WBC: 15.7 10*3/uL — ABNORMAL HIGH (ref 4.0–10.5)
nRBC: 1.3 % — ABNORMAL HIGH (ref 0.0–0.2)

## 2023-07-01 LAB — RENAL FUNCTION PANEL
Albumin: 2.6 g/dL — ABNORMAL LOW (ref 3.5–5.0)
Albumin: 2.7 g/dL — ABNORMAL LOW (ref 3.5–5.0)
Anion gap: 6 (ref 5–15)
Anion gap: 9 (ref 5–15)
BUN: 20 mg/dL (ref 6–20)
BUN: 22 mg/dL — ABNORMAL HIGH (ref 6–20)
CO2: 27 mmol/L (ref 22–32)
CO2: 25 mmol/L (ref 22–32)
Calcium: 8 mg/dL — ABNORMAL LOW (ref 8.9–10.3)
Calcium: 8.2 mg/dL — ABNORMAL LOW (ref 8.9–10.3)
Chloride: 99 mmol/L (ref 98–111)
Chloride: 98 mmol/L (ref 98–111)
Creatinine, Ser: 2.28 mg/dL — ABNORMAL HIGH (ref 0.61–1.24)
Creatinine, Ser: 2.27 mg/dL — ABNORMAL HIGH (ref 0.61–1.24)
GFR, Estimated: 34 mL/min — ABNORMAL LOW (ref >60)
GFR, Estimated: 34 mL/min — ABNORMAL LOW (ref >60)
Glucose, Bld: 293 mg/dL — ABNORMAL HIGH (ref 70–99)
Glucose, Bld: 236 mg/dL — ABNORMAL HIGH (ref 70–99)
Phosphorus: 2 mg/dL — ABNORMAL LOW (ref 2.5–4.6)
Phosphorus: 2.1 mg/dL — ABNORMAL LOW (ref 2.5–4.6)
Potassium: 4.5 mmol/L (ref 3.5–5.1)
Potassium: 4.7 mmol/L (ref 3.5–5.1)
Sodium: 132 mmol/L — ABNORMAL LOW (ref 135–145)
Sodium: 132 mmol/L — ABNORMAL LOW (ref 135–145)

## 2023-07-01 LAB — GLUCOSE, CAPILLARY
Glucose-Capillary: 285 mg/dL — ABNORMAL HIGH (ref 70–99)
Glucose-Capillary: 295 mg/dL — ABNORMAL HIGH (ref 70–99)
Glucose-Capillary: 288 mg/dL — ABNORMAL HIGH (ref 70–99)
Glucose-Capillary: 217 mg/dL — ABNORMAL HIGH (ref 70–99)
Glucose-Capillary: 321 mg/dL — ABNORMAL HIGH (ref 70–99)

## 2023-07-01 LAB — CULTURE, BLOOD (ROUTINE X 2)
Culture: NO GROWTH
Culture: NO GROWTH

## 2023-07-01 LAB — MAGNESIUM
Magnesium: 2.4 mg/dL (ref 1.7–2.4)
Magnesium: 2.6 mg/dL — ABNORMAL HIGH (ref 1.7–2.4)

## 2023-07-01 LAB — HEPARIN LEVEL (UNFRACTIONATED)
Heparin Unfractionated: 0.38 [IU]/mL (ref 0.30–0.70)
Heparin Unfractionated: 0.4 [IU]/mL (ref 0.30–0.70)

## 2023-07-01 LAB — PHOSPHORUS
Phosphorus: 2 mg/dL — ABNORMAL LOW (ref 2.5–4.6)
Phosphorus: 2 mg/dL — ABNORMAL LOW (ref 2.5–4.6)

## 2023-07-01 LAB — COOXEMETRY PANEL
Carboxyhemoglobin: 1.1 % (ref 0.5–1.5)
Methemoglobin: <0.7 % (ref 0.0–1.5)
O2 Saturation: 52.4 %
Total hemoglobin: 7 g/dL — ABNORMAL LOW (ref 12.0–16.0)

## 2023-07-01 MED ORDER — PANTOPRAZOLE SODIUM 40 MG PO TBEC
40.0000 mg | DELAYED_RELEASE_TABLET | Freq: Every day | ORAL | Status: DC
  Administered 2023-07-01 – 2023-07-04 (×4): 40 mg via ORAL
  Filled 2023-07-01 (×4): qty 1

## 2023-07-01 MED ORDER — INSULIN ASPART 100 UNIT/ML IJ SOLN
0.0000 [IU] | Freq: Three times a day (TID) | INTRAMUSCULAR | Status: DC
  Administered 2023-07-01: 7 [IU] via SUBCUTANEOUS

## 2023-07-01 MED ORDER — INSULIN ASPART 100 UNIT/ML IJ SOLN
0.0000 [IU] | INTRAMUSCULAR | Status: DC
  Administered 2023-07-01: 11 [IU] via SUBCUTANEOUS
  Administered 2023-07-01: 15 [IU] via SUBCUTANEOUS
  Administered 2023-07-02 (×3): 11 [IU] via SUBCUTANEOUS
  Administered 2023-07-02: 7 [IU] via SUBCUTANEOUS
  Administered 2023-07-02: 11 [IU] via SUBCUTANEOUS
  Administered 2023-07-03 (×2): 4 [IU] via SUBCUTANEOUS
  Administered 2023-07-03: 7 [IU] via SUBCUTANEOUS
  Administered 2023-07-03: 11 [IU] via SUBCUTANEOUS
  Administered 2023-07-03: 7 [IU] via SUBCUTANEOUS
  Administered 2023-07-03: 15 [IU] via SUBCUTANEOUS
  Administered 2023-07-04: 7 [IU] via SUBCUTANEOUS
  Administered 2023-07-04 (×2): 11 [IU] via SUBCUTANEOUS

## 2023-07-01 MED ORDER — K PHOS MONO-SOD PHOS DI & MONO 155-852-130 MG PO TABS
500.0000 mg | ORAL_TABLET | Freq: Once | ORAL | Status: AC
  Administered 2023-07-01: 500 mg via ORAL
  Filled 2023-07-01: qty 2

## 2023-07-01 MED ORDER — INSULIN ASPART 100 UNIT/ML IJ SOLN: 0.0000 [IU] | INTRAMUSCULAR | Status: DC

## 2023-07-01 MED ORDER — INSULIN GLARGINE-YFGN 100 UNIT/ML ~~LOC~~ SOLN
27.0000 [IU] | Freq: Every day | SUBCUTANEOUS | Status: DC
  Filled 2023-07-01: qty 0.27

## 2023-07-01 MED ORDER — INSULIN ASPART 100 UNIT/ML IJ SOLN
8.0000 [IU] | INTRAMUSCULAR | Status: DC
  Administered 2023-07-01 – 2023-07-02 (×4): 8 [IU] via SUBCUTANEOUS

## 2023-07-01 MED ORDER — MAGNESIUM SULFATE 2 GM/50ML IV SOLN: 2.0000 g | Freq: Once | INTRAVENOUS | Status: AC

## 2023-07-01 MED ORDER — MAGNESIUM SULFATE 2 GM/50ML IV SOLN
INTRAVENOUS | Status: AC
  Administered 2023-07-01: 2 g via INTRAVENOUS
  Filled 2023-07-01: qty 50

## 2023-07-01 MED ORDER — INSULIN ASPART 100 UNIT/ML IJ SOLN
6.0000 [IU] | Freq: Three times a day (TID) | INTRAMUSCULAR | Status: DC
  Administered 2023-07-01: 6 [IU] via SUBCUTANEOUS

## 2023-07-01 MED ORDER — INSULIN ASPART 100 UNIT/ML IJ SOLN: 8.0000 [IU] | INTRAMUSCULAR | Status: DC

## 2023-07-01 MED ORDER — RENA-VITE PO TABS
1.0000 | ORAL_TABLET | Freq: Two times a day (BID) | ORAL | Status: DC
  Administered 2023-07-01 – 2023-07-04 (×6): 1 via ORAL
  Filled 2023-07-01 (×6): qty 1

## 2023-07-01 MED ORDER — INSULIN GLARGINE-YFGN 100 UNIT/ML ~~LOC~~ SOLN
7.0000 [IU] | Freq: Once | SUBCUTANEOUS | Status: AC
  Administered 2023-07-01: 7 [IU] via SUBCUTANEOUS
  Filled 2023-07-01: qty 0.07

## 2023-07-01 MED ORDER — INSULIN ASPART 100 UNIT/ML IJ SOLN
8.0000 [IU] | INTRAMUSCULAR | Status: DC
  Administered 2023-07-01: 8 [IU] via SUBCUTANEOUS

## 2023-07-01 NOTE — Progress Notes (Addendum)
 PHARMACY - ANTICOAGULATION CONSULT NOTE  Pharmacy Consult for heparin  Indication:  Impella CP/apical thrombus  No Known Allergies  Patient Measurements: Height: 6' 3 (190.5 cm) Weight: 99.8 kg (220 lb 0.3 oz) IBW/kg (Calculated) : 84.5 Heparin  Dosing Weight: 107 kg  Vital Signs: Temp: 99 F (37.2 C) (01/02 0600) Temp Source: Core (01/02 0400) BP: 101/73 (01/02 0600) Pulse Rate: 93 (01/02 0600)  Labs: Recent Labs    06/29/23 0505 06/29/23 1114 06/30/23 0417 06/30/23 1616 07/01/23 0435  HGB 7.6*  --  7.3*  --  7.4*  HCT 21.9*  --  21.1*  --  21.7*  PLT 141*  --  119*  --  120*  HEPARINUNFRC 0.43   < > 0.46 0.47 0.38  CREATININE 3.44*   < > 2.57*  2.47* 2.37* 2.28*   < > = values in this interval not displayed.    Estimated Creatinine Clearance: 45.3 mL/min (A) (by C-G formula based on SCr of 2.28 mg/dL (H)).   Medical History: Past Medical History:  Diagnosis Date   Depression 1989   Diabetes mellitus without complication (HCC)    Gastroesophageal reflux disease 01/31/2014   HLD (hyperlipidemia) 03/12/2023   Hypertension    ST elevation myocardial infarction (STEMI) (HCC) 06/25/2023   Ulcer 2016     Assessment: 42 yoM admitted as late presenting STEMI s/p unsuccessful PCI and Impella CP placement. ECHO showing large apical thrombus. Pharmacy to manage heparin . Bicarbonate purge started. No AC PTA.  Heparin  level therapeutic at 0.38 on heparin  drip rate of 1800 u/hr.  No bleeding issues, h/h low stable , pltc slightly lower- watch. LDH trending down   Goal of Therapy:  Heparin  level 0.3-0.5 units/ml Monitor platelets by anticoagulation protocol: Yes   Plan:  Continue heparin  1800 units/h Bicarbonate purge, weaning imprella Q12h heparin  level, CBC Monitor s/s bleeding   Randall Call, PharmD PGY2 Critical Care Pharmacy Resident 07/01/2023 6:37 AM

## 2023-07-01 NOTE — Consult Note (Signed)
 WOC Nurse Consult Note: Reason for Consult: Consult requested for right foot wound.  Pt is followed as an outpatient by the wound care center at [facility] and states he has been using a Silver dressing and changing it every other day.  They perform serial debridements to the calloused edges. He was last seen there a few weeks ago. Right leg elevated in Prevalon boot to reduce pressure. Right plantar foot with chronic full thickness wound; 2.5X1X.25cm, moist yellow and dry brown slightly raised calloused edges.  Dressing procedure/placement/frequency: Topical treatment orders provided for bedside nurses to perform as previously ordered: Apply a piece of Aquacel Soila # [phone]) to right foot wound Q Tues/Thurs/Sat, then cover with foam dressing. Moisten previous dressing with NS each time to assist with removal. Pt plans to resume follow-up with the outpatient wound care center after discharge.  Please re-consult if further assistance is needed.  Thank-you,  Stephane Fought MSN, RN, CWOCN, [place], CNS [phone]

## 2023-07-01 NOTE — Progress Notes (Signed)
 PHARMACY - ANTICOAGULATION CONSULT NOTE  Pharmacy Consult for heparin  Indication:  Impella CP/apical thrombus  No Known Allergies  Patient Measurements: Height: 6' 3 (190.5 cm) Weight: 99.8 kg (220 lb 0.3 oz) IBW/kg (Calculated) : 84.5 Heparin  Dosing Weight: 107 kg  Vital Signs: Temp: 98 F (36.7 C) (01/02 1600) Temp Source: Oral (01/02 1600) BP: 93/77 (01/02 1800) Pulse Rate: 92 (01/02 1800)  Labs: Recent Labs    06/29/23 0505 06/29/23 1114 06/30/23 0417 06/30/23 1616 07/01/23 0435 07/01/23 1729  HGB 7.6*  --  7.3*  --  7.4*  --   HCT 21.9*  --  21.1*  --  21.7*  --   PLT 141*  --  119*  --  120*  --   HEPARINUNFRC 0.43   < > 0.46 0.47 0.38 0.40  CREATININE 3.44*   < > 2.57*  2.47* 2.37* 2.28*  --    < > = values in this interval not displayed.    Estimated Creatinine Clearance: 45.3 mL/min (A) (by C-G formula based on SCr of 2.28 mg/dL (H)).   Medical History: Past Medical History:  Diagnosis Date   Depression 1989   Diabetes mellitus without complication (HCC)    Gastroesophageal reflux disease 01/31/2014   HLD (hyperlipidemia) 03/12/2023   Hypertension    ST elevation myocardial infarction (STEMI) (HCC) 06/25/2023   Ulcer 2016     Assessment: 75 yoM admitted as late presenting STEMI s/p unsuccessful PCI and Impella CP placement. ECHO showing large apical thrombus. Pharmacy to manage heparin . Bicarbonate purge started. No AC PTA.  Heparin  level therapeutic at 0.40 on heparin  drip rate of 1800 u/hr.  No bleeding issues, h/h low stable , pltc slightly lower- watch. LDH trending down   Goal of Therapy:  Heparin  level 0.3-0.5 units/ml Monitor platelets by anticoagulation protocol: Yes   Plan:  Continue heparin  1800 units/h Bicarbonate purge, weaning Impella Q12h heparin  level, CBC Monitor s/s bleeding   Rankin Sams, PharmD, BCPS, BCCCP Clinical Pharmacist

## 2023-07-01 NOTE — Plan of Care (Signed)
  Problem: Education: Goal: Individualized Educational Video(s) Outcome: Progressing   Problem: Coping: Goal: Ability to adjust to condition or change in health will improve Outcome: Progressing   Problem: Fluid Volume: Goal: Ability to maintain a balanced intake and output will improve Outcome: Progressing

## 2023-07-01 NOTE — Progress Notes (Signed)
 [facility] Progress Note   Assessment/ Plan:   Assessment/Plan: 53 year old male with hypertension, diabetes, and mild CKD.  He now presents late after MI and is suffering from cardiogenic shock complicated by acute renal failure 1.Renal-does have some mild CKD at baseline.  Also at least 1 episode in the past of AKI.  Urinalyses in the past have shown proteinuria-also for some reason had 1 done on 12/13 that showed some microscopic hematuria..  Will check urine here today for completeness sake but suspect that major etiology of his acute kidney injury is hemodynamic in nature in the setting of cardiogenic shock.  Has an Impella in place and is on norepinephrine  and milrinone  to reinforce blood pressure and kidney perfusion.   - did not respond to Lasix  challenge - started CRRT 06/28/23 - all 4K bath, no heparin  as he is on systemic gtt - gentle net neg - no blood products, on iron  and ESA  2. Hypertension/volume  -UF as tolerated  3.  Cardiovascular-significant issue with multivessel CAD that was unable to be intervened upon.  - impella, milrinone , norepi - per AHF, going to wean milrinone  today  4. Anemia  -supportive care at this time  - start Aranesp   -IV iron   5.  R foot ulcer  - tells me that he has arterial dopplers scheduled as OP--> probably should do them here when feasible  6.  LV thrombus - hep gtt  Subjective:    Seen in room- hiccups better, getting thorazine .  CRRT running well, Hgb stable.     Objective:   BP 110/74   Pulse 93   Temp 99 F (37.2 C)   Resp 19   Ht 6' 3 (1.905 m)   Wt 99.8 kg   SpO2 99%   BMI 27.50 kg/m   Intake/Output Summary (Last 24 hours) at 07/01/2023 0935 Last data filed at 07/01/2023 0900 Gross per 24 hour  Intake 4207.27 ml  Output 5766.6 ml  Net -1559.33 ml   Weight change: 7.1 kg  Physical Exam: Gen: lying in bed, appears ill CVS: RRR Resp clear anteriorly Abd: mildly distended Ext: 1+ LE edema ACCESS; L  internal jugular nontunneled HD cath  Imaging: ECHOCARDIOGRAM LIMITED Result Date: 06/30/2023    ECHOCARDIOGRAM LIMITED REPORT   Patient Name:   Gabriel Shaw Date of Exam: 06/30/2023 Medical Rec #:  [id number]     Height:       75.0 in Accession #:    [id number]    Weight:       204.4 lb Date of Birth:  03-18-1971    BSA:          2.215 m Patient Age:    52 years      BP:           99/69 mmHg Patient Gender: M             HR:           88 bpm. Exam Location:  Inpatient Procedure: Limited Echo Indications:     Impella Support  History:         Patient has prior history of Echocardiogram examinations, most                  recent 06/27/2023.  Sonographer:     Jayson Gaskins Referring Phys:  [id number] EZ[address] GORMAN SHUCK Diagnosing Phys: Darryle Decent MD IMPRESSIONS  1. Global LV hypokinesis, EF 20-25%. Impella 4.4 cm from AoV. LV apical thrombus noted as  present on the prior study. Left ventricular ejection fraction, by estimation, is 20 to 25%. The left ventricle has severely decreased function. The left ventricle  demonstrates global hypokinesis.  2. Right ventricular systolic function is moderately reduced. The right ventricular size is moderately enlarged. FINDINGS  Left Ventricle: Global LV hypokinesis, EF 20-25%. Impella 4.4 cm from AoV. LV apical thrombus noted as present on the prior study. Left ventricular ejection fraction, by estimation, is 20 to 25%. The left ventricle has severely decreased function. The left ventricle demonstrates global hypokinesis. Right Ventricle: The right ventricular size is moderately enlarged. No increase in right ventricular wall thickness. Right ventricular systolic function is moderately reduced. Pericardium: There is no evidence of pericardial effusion. Darryle Decent MD Electronically signed by Darryle Decent MD Signature Date/Time: 06/30/2023/1:09:20 PM    Final     Labs: BMET Recent Labs  Lab 06/28/23 0408 06/28/23 1553 06/29/23 0505 06/29/23 1617 06/30/23 0417 06/30/23 1616  07/01/23 0435  NA 135 131* 134* 133* 133*  132* 133* 132*  K 3.7 3.6 3.7 4.0 4.2  4.1 4.3 4.5  CL 102 100 103 101 99  100 99 99  CO2 19* 20* 24 23 26  25 26 27   GLUCOSE 171* 197* 225* 206* 236*  233* 269* 293*  BUN 71* 52* 37* 28* 26*  24* 22* 20  CREATININE 6.43* 4.66* 3.44* 2.80* 2.57*  2.47* 2.37* 2.28*  CALCIUM  8.4* 8.0* 7.8* 7.8* 7.8*  7.8* 8.0* 8.0*  PHOS  --  2.8 2.8 2.3* 2.1*  2.2* 2.1*  2.1* 2.0*  2.0*   CBC Recent Labs  Lab 06/25/23 0822 06/25/23 1023 06/28/23 0408 06/29/23 0505 06/30/23 0417 07/01/23 0435  WBC 19.5*   < > 16.5* 14.1* 13.4* 15.7*  NEUTROABS 16.0*  --   --   --   --   --   HGB 12.4*   < > 7.9* 7.6* 7.3* 7.4*  HCT 37.4*   < > 22.7* 21.9* 21.1* 21.7*  MCV 87.2   < > 81.9 83.9 84.1 86.1  PLT 348   < > 169 141* 119* 120*   < > = values in this interval not displayed.    Medications:     aspirin  EC  81 mg Oral Daily   atorvastatin   80 mg Oral Daily   Chlorhexidine  Gluconate Cloth  6 each Topical Daily   feeding supplement (PROSource TF20)  60 mL Per Tube TID   insulin  aspart  0-20 Units Subcutaneous Q4H   insulin  aspart  5 Units Subcutaneous Q4H   insulin  glargine-yfgn  20 Units Subcutaneous Daily   metoCLOPramide  (REGLAN ) injection  10 mg Intravenous Q8H   multivitamin  1 tablet Per Tube BID   mouth rinse  15 mL Mouth Rinse 4 times per day   pantoprazole  (PROTONIX ) IV  40 mg Intravenous Q24H   polyethylene glycol  17 g Oral Daily   senna-docusate  1 tablet Oral BID   sodium chloride  flush  3 mL Intravenous Q12H    Almarie Bonine, MD 07/01/2023, 9:35 AM

## 2023-07-01 NOTE — Progress Notes (Addendum)
 Patient ID: Gabriel Shaw, male   DOB: [birth year], 53 y.o.   MRN: [id number]     Advanced Heart Failure Rounding Note  Cardiologist: None   Chief Complaint: Cardiogenic shock  Subjective:    Currently on 0.375 milrinone . NE off. 120 CC UOP last 24 hrs. 5.6 L volume removed with CRRT, negative 1.5L last 24 hrs.   Impella CP: P6 Flow 2.8 L/min  Swan: CVP 6 PA 28/6 CI 3.17 Co-ox 52% LDH 1261 => 1012 => 966 => 761 => 731 => 687  Hgb 7s over last few days, 7.4 this am.  Has had Venofer  and Aranesp .   Platelets slowly trending down, 120 today.  HIT negative.  Maintaining SR with amiodarone  gtt.   Nausea improved. Tolerating more po intake. Ate most of his breakfast. Wants CorTrak out.   There is a hole in PA catheter tubing.   Objective:   Weight Range: 99.8 kg Body mass index is 27.5 kg/m.   Vital Signs:   Temp:  [97.3 F (36.3 C)-99.3 F (37.4 C)] 99.1 F (37.3 C) (01/02 0900) Pulse Rate:  [80-96] 93 (01/02 0900) Resp:  [5-29] 19 (01/02 0900) BP: (78-115)/(56-86) 108/76 (01/02 0900) SpO2:  [81 %-100 %] 98 % (01/02 0900) Weight:  [99.8 kg] 99.8 kg (01/02 0343) Last BM Date : 06/30/23  Weight change: Filed Weights   06/29/23 0500 06/30/23 0500 07/01/23 0343  Weight: 94.4 kg 92.7 kg 99.8 kg    Intake/Output:   Intake/Output Summary (Last 24 hours) at 07/01/2023 0929 Last data filed at 07/01/2023 0900 Gross per 24 hour  Intake 4207.27 ml  Output 5766.6 ml  Net -1559.33 ml      Physical Exam   General: Fatigued appearing. HEENT: + CorTrak Neck: supple. R internal jugular SWAN. L internal jugular HD cath.  Cor: PMI nondisplaced. Regular rate & rhythm. No rubs, gallops or murmurs. Lungs: clear Abdomen: soft, nontender, nondistended.  Extremities: no cyanosis, clubbing, rash, edema, R femoral Impella CP Neuro: alert & orientedx3. Affect pleasant   Telemetry   SR 80s-90s, occasional PVCs  Labs    CBC Recent Labs    06/30/23 0417 07/01/23 0435   WBC 13.4* 15.7*  HGB 7.3* 7.4*  HCT 21.1* 21.7*  MCV 84.1 86.1  PLT 119* 120*   Basic Metabolic Panel Recent Labs    98/98/74 1616 07/01/23 0435  NA 133* 132*  K 4.3 4.5  CL 99 99  CO2 26 27  GLUCOSE 269* 293*  BUN 22* 20  CREATININE 2.37* 2.28*  CALCIUM  8.0* 8.0*  MG 2.5* 2.4  PHOS 2.1*  2.1* 2.0*  2.0*   Liver Function Tests Recent Labs    06/29/23 0505 06/29/23 1617 06/30/23 0417 06/30/23 1616 07/01/23 0435  AST 39  --  37  --   --   ALT 24  --  24  --   --   ALKPHOS 43  --  48  --   --   BILITOT 0.7  --  0.7  --   --   PROT 5.5*  --  5.8*  --   --   ALBUMIN  2.6*   < > 2.5*  2.5* 2.5* 2.6*   < > = values in this interval not displayed.   No results for input(s): LIPASE, AMYLASE in the last 72 hours. Cardiac Enzymes No results for input(s): CKTOTAL, CKMB, CKMBINDEX, TROPONINI in the last 72 hours.  BNP: BNP (last 3 results) No results for input(s): BNP in the last 8760  hours.  ProBNP (last 3 results) No results for input(s): PROBNP in the last 8760 hours.   D-Dimer No results for input(s): DDIMER in the last 72 hours. Hemoglobin A1C No results for input(s): HGBA1C in the last 72 hours. Fasting Lipid Panel No results for input(s): CHOL, HDL, LDLCALC, TRIG, CHOLHDL, LDLDIRECT in the last 72 hours.  Thyroid  Function Tests No results for input(s): TSH, T4TOTAL, T3FREE, THYROIDAB in the last 72 hours.  Invalid input(s): FREET3  Other results:   Imaging    ECHOCARDIOGRAM LIMITED Result Date: 06/30/2023    ECHOCARDIOGRAM LIMITED REPORT   Patient Name:   Gabriel Shaw Date of Exam: 06/30/2023 Medical Rec #:  [id number]     Height:       75.0 in Accession #:    [id number]    Weight:       204.4 lb Date of Birth:  06-22-1971    BSA:          2.215 m Patient Age:    52 years      BP:           99/69 mmHg Patient Gender: M             HR:           88 bpm. Exam Location:  Inpatient Procedure: Limited Echo  Indications:     Impella Support  History:         Patient has prior history of Echocardiogram examinations, most                  recent 06/27/2023.  Sonographer:     Jayson Gaskins Referring Phys:  [id number] EZRA GORMAN SHUCK Diagnosing Phys: Darryle Decent MD IMPRESSIONS  1. Global LV hypokinesis, EF 20-25%. Impella 4.4 cm from AoV. LV apical thrombus noted as present on the prior study. Left ventricular ejection fraction, by estimation, is 20 to 25%. The left ventricle has severely decreased function. The left ventricle  demonstrates global hypokinesis.  2. Right ventricular systolic function is moderately reduced. The right ventricular size is moderately enlarged. FINDINGS  Left Ventricle: Global LV hypokinesis, EF 20-25%. Impella 4.4 cm from AoV. LV apical thrombus noted as present on the prior study. Left ventricular ejection fraction, by estimation, is 20 to 25%. The left ventricle has severely decreased function. The left ventricle demonstrates global hypokinesis. Right Ventricle: The right ventricular size is moderately enlarged. No increase in right ventricular wall thickness. Right ventricular systolic function is moderately reduced. Pericardium: There is no evidence of pericardial effusion. Darryle Decent MD Electronically signed by Darryle Decent MD Signature Date/Time: 06/30/2023/1:09:20 PM    Final       Medications:   Scheduled Medications:  aspirin  EC  81 mg Oral Daily   atorvastatin   80 mg Oral Daily   Chlorhexidine  Gluconate Cloth  6 each Topical Daily   feeding supplement (PROSource TF20)  60 mL Per Tube TID   insulin  aspart  0-20 Units Subcutaneous Q4H   insulin  aspart  5 Units Subcutaneous Q4H   insulin  glargine-yfgn  20 Units Subcutaneous Daily   metoCLOPramide  (REGLAN ) injection  10 mg Intravenous Q8H   multivitamin  1 tablet Per Tube BID   mouth rinse  15 mL Mouth Rinse 4 times per day   pantoprazole  (PROTONIX ) IV  40 mg Intravenous Q24H   polyethylene glycol  17 g Oral Daily    senna-docusate  1 tablet Oral BID   sodium chloride  flush  3 mL Intravenous Q12H  Infusions:   prismasol  BGK 4/2.5 500 mL/hr at 07/01/23 0254    prismasol  BGK 4/2.5 400 mL/hr at 06/30/23 2206   amiodarone  30 mg/hr (07/01/23 0900)   feeding supplement (VITAL 1.5 CAL) 65 mL/hr at 07/01/23 0900   heparin  1,800 Units/hr (07/01/23 0900)   milrinone  0.375 mcg/kg/min (07/01/23 0900)   norepinephrine  (LEVOPHED ) Adult infusion Stopped (07/01/23 0102)   prismasol  BGK 4/2.5 2,000 mL/hr at 07/01/23 9171   promethazine  (PHENERGAN ) injection (IM or IVPB) 12.5 mg (06/27/23 2030)   sodium bicarbonate  25 mEq (Impella PURGE) in dextrose  5 % 1000 mL bag      PRN Medications: acetaminophen , ALPRAZolam , chlorproMAZINE  (THORAZINE ) injection, haloperidol  lactate, heparin , ondansetron  (ZOFRAN ) IV, mouth rinse, mouth rinse, promethazine  (PHENERGAN ) injection (IM or IVPB), sodium chloride , sodium chloride  flush  Assessment/Plan   1. CAD: Late presenting anterior/inferior STEMI/CAD.  70% mid LAD, 100% m-dLAD, 90% pRCA, 90% mRCA, 70% LPAV.  Disease not amenable to PCI, failed attempt to cross LAD occlusion x 3.  No chest pain. Medical management.  - ASA + statin   - no ? blocker w/ shock  2. Acute Systolic Heart Failure/Cardiogenic shock: Ischemic cardiomyopathy, limited echo 12/29 with EF 25% with LAD territory WMAs, LV thrombus, RV normal. Worry that he has nonviable LAD-territory myocardium. Initial RHC with mean RA 6, mean PCWP 20, CI 1.93. Impella CP at P6. On milrinone  0.375. NE off. CI 3.17 off SWAN and CO-OX 52%. CVP 6 today, run even on CVVH. - Wean Impella to P5 - On heparin  gtt with Impella and LV thrombus.  - Remove SWAN and keep introducer.  - If we cannot wean off Impella, will need to consider Impella 5.5 to allow mobility.  LV apical thrombus will need to resolve before consideration of Impella 5.5.  However, more advanced options (LVAD) are going to be severely limited by AKI on HD and Jehov[employer]'s  Witness not accepting blood products.    3. AKI: SCr baseline 1.3.  Suspect ATN in setting of cardiogenic shock.  Now on CVVH, awaiting renal recovery.   - Maintain CO and MAP.  - CVP 6. Run CVVH even today. 4. Elevated LFTs: Suspect shock liver, trending down.  5. ID: Now afebrile with WBCs trending down.  - Completed ceftriaxone  6. Type 2DM: SSI. 7. Anemia: Blood loss at Impella site initially, seems to have stopped.  Hgb 9.7 => 7.9=> 7.6 => 7.3. Jehov[employer]'s Witness so no blood products.  - he would NOT accept any blood products even in a life and death situation.  - Has had IV Fe and Aranesp .  8. LV thrombus: On heparin  gtt. Still noted on 1/1 echo.  9. Atrial fibrillation: Noted on 12/29. Converted with amiodarone  gtt. Continue amiodarone  at 30/hr. - On heparin  gtt 10. FEN: Tube feeds via Cortrack ongoing due to poor appetite/nausea.  Improving. Better appetite. - He wants CorTrak out. Will have nutrition evaluate him before removal. 11. Thrombocytopenia: May be due to Impella.  HIT negative. Platelets stable at 120 today - Echo 01/01: Impella position ok, still has apical thrombus.  12. Diabetic foot ulcer: Right foot.   - Will get wound consult.   FINCH, LINDSAY N 07/01/2023 9:29 AM  Patient seen with NP, agree with the above note.   I/Os net negative 1525 yesterday, UOP 120 cc.  CVP 6.    He is now on milrinone  0.375 and off NE.  Impella decreased today to P5, so far tolerating.    He now has a good appetite, wants  Cortrack out.   General: NAD Neck: No JVD, no thyromegaly or thyroid  nodule.  Lungs: Clear to auscultation bilaterally with normal respiratory effort. CV: Nondisplaced PMI.  Heart regular S1/S2, no S3/S4, no murmur.  No peripheral edema.    Abdomen: Soft, nontender, no hepatosplenomegaly, no distention.  Skin: Intact without lesions or rashes.  Neurologic: Alert and oriented x 3.  Psych: Normal affect. Extremities: No clubbing or cyanosis. Ulcer dorsum right  foot.  HEENT: Normal.   He is tolerating decrease of Impella to P5.  Continue slow wean.  Will continue milrinone , now off NE.   Volume status looks good with CVP 6.  Run CVVH even to slightly negative.  Continue to follow for renal recovery.   Hgb stable today at 7.4.  No blood products.    Nutrition to see today, think he can probably get Cortrack out.   CRITICAL CARE Performed by: Ezra Shuck  Total critical care time: 35 minutes  Critical care time was exclusive of separately billable procedures and treating other patients.  Critical care was necessary to treat or prevent imminent or life-threatening deterioration.  Critical care was time spent personally by me on the following activities: development of treatment plan with patient and/or surrogate as well as nursing, discussions with consultants, evaluation of patient's response to treatment, examination of patient, obtaining history from patient or surrogate, ordering and performing treatments and interventions, ordering and review of laboratory studies, ordering and review of radiographic studies, pulse oximetry and re-evaluation of patient's condition.  Ezra Shuck 07/01/2023 11:34 AM

## 2023-07-01 NOTE — Progress Notes (Signed)
 Brief Nutrition Note  Contacted by RN that pt requesting his cortrak tube be removed. Pt reporting an improvement in appetite and MD requests intake be evaluated prior to tube removal.   Discussed with RN. Reports that pt did eat well at breakfast (~60-70% of tray) but that he got nauseated after getting lunch. Currently pt's nutrition needs are elevated due to acute illness and CRRT which will make consuming adequate nutrition difficult unless pt is able to consume all 3 meals as well as take in nutrition supplements.   Will start kcal count to record PO intake and determine if pt's needs are being met. Will adjust TF rate and schedule as needed pending PO intake.  Vernell Lukes, RD, LDN Registered Dietitian II Please reach out via secure chat Weekend on-call pager # available in [facility]

## 2023-07-01 NOTE — Inpatient Diabetes Management (Signed)
 Inpatient Diabetes Program Recommendations  AACE/ADA: New Consensus Statement on Inpatient Glycemic Control (2015)  Target Ranges:  Prepandial:   less than 140 mg/dL      Peak postprandial:   less than 180 mg/dL (1-2 hours)      Critically ill patients:  140 - 180 mg/dL   Lab Results  Component Value Date   GLUCAP 295 (H) 07/01/2023   HGBA1C 7.0 (H) 06/11/2023    Latest Reference Range & Units 06/30/23 08:32 06/30/23 11:54 06/30/23 15:40 06/30/23 20:59 06/30/23 23:26 07/01/23 04:34 07/01/23 08:03  Glucose-Capillary 70 - 99 mg/dL 595 (H) 715 (H) 743 (H) 223 (H) 210 (H) 285 (H) 295 (H)  (H): Data is abnormally high  Diabetes history: DM2 Outpatient Diabetes medications: Lantus  30 units daily, Novolog  10 units tid meal coverage, Metformin  1 gm bid, Ozempic  1 mg weekly Current orders for Inpatient glycemic control: Semglee  20 units daily, Novolog  5 units q 4 hrs., Novolog  0-20 units tid q 4 hrs.   Inpatient Diabetes Program Recommendations:   Please consider: -Increase Semglee  to 25 units daily -Increase Novolog  8 units q 4 hrs. (Hold if tube feed held or stopped for any reason)  Thank you, Glema Takaki E. Lindsay Soulliere, RN, MSN, CDCES  Diabetes Coordinator Inpatient Glycemic Control Team Team Pager [phone] (8am-5pm) 07/01/2023 9:34 AM

## 2023-07-02 ENCOUNTER — Inpatient Hospital Stay (HOSPITAL_COMMUNITY): Payer: Medicaid Other

## 2023-07-02 ENCOUNTER — Encounter (HOSPITAL_COMMUNITY): Payer: Self-pay | Admitting: Internal Medicine

## 2023-07-02 DIAGNOSIS — D62 Acute posthemorrhagic anemia: Secondary | ICD-10-CM | POA: Diagnosis not present

## 2023-07-02 DIAGNOSIS — R1084 Generalized abdominal pain: Secondary | ICD-10-CM | POA: Diagnosis not present

## 2023-07-02 DIAGNOSIS — I509 Heart failure, unspecified: Secondary | ICD-10-CM | POA: Diagnosis not present

## 2023-07-02 DIAGNOSIS — E440 Moderate protein-calorie malnutrition: Secondary | ICD-10-CM | POA: Insufficient documentation

## 2023-07-02 DIAGNOSIS — R570 Cardiogenic shock: Secondary | ICD-10-CM | POA: Diagnosis not present

## 2023-07-02 LAB — LACTATE DEHYDROGENASE: LDH: 629 U/L — ABNORMAL HIGH (ref 98–192)

## 2023-07-02 LAB — HEPARIN LEVEL (UNFRACTIONATED)
Heparin Unfractionated: 0.37 [IU]/mL (ref 0.30–0.70)
Heparin Unfractionated: 0.37 [IU]/mL (ref 0.30–0.70)

## 2023-07-02 LAB — TROPONIN I (HIGH SENSITIVITY)
Troponin I (High Sensitivity): 6779 ng/L (ref <18)
Troponin I (High Sensitivity): 6722 ng/L (ref <18)

## 2023-07-02 LAB — CBC
HCT: 21.6 % — ABNORMAL LOW (ref 39.0–52.0)
HCT: 21.4 % — ABNORMAL LOW (ref 39.0–52.0)
HCT: 20.6 % — ABNORMAL LOW (ref 39.0–52.0)
Hemoglobin: 7.1 g/dL — ABNORMAL LOW (ref 13.0–17.0)
Hemoglobin: 7.2 g/dL — ABNORMAL LOW (ref 13.0–17.0)
Hemoglobin: 6.9 g/dL — CL (ref 13.0–17.0)
MCH: 28.6 pg (ref 26.0–34.0)
MCH: 29.1 pg (ref 26.0–34.0)
MCH: 29 pg (ref 26.0–34.0)
MCHC: 32.9 g/dL (ref 30.0–36.0)
MCHC: 33.6 g/dL (ref 30.0–36.0)
MCHC: 33.5 g/dL (ref 30.0–36.0)
MCV: 87.1 fL (ref 80.0–100.0)
MCV: 86.6 fL (ref 80.0–100.0)
MCV: 86.6 fL (ref 80.0–100.0)
Platelets: 101 10*3/uL — ABNORMAL LOW (ref 150–400)
Platelets: 100 10*3/uL — ABNORMAL LOW (ref 150–400)
Platelets: 78 10*3/uL — ABNORMAL LOW (ref 150–400)
RBC: 2.48 MIL/uL — ABNORMAL LOW (ref 4.22–5.81)
RBC: 2.47 MIL/uL — ABNORMAL LOW (ref 4.22–5.81)
RBC: 2.38 MIL/uL — ABNORMAL LOW (ref 4.22–5.81)
RDW: 13.9 % (ref 11.5–15.5)
RDW: 14.1 % (ref 11.5–15.5)
RDW: 14.3 % (ref 11.5–15.5)
WBC: 18.4 10*3/uL — ABNORMAL HIGH (ref 4.0–10.5)
WBC: 20.3 10*3/uL — ABNORMAL HIGH (ref 4.0–10.5)
WBC: 17.7 10*3/uL — ABNORMAL HIGH (ref 4.0–10.5)
nRBC: 2.9 % — ABNORMAL HIGH (ref 0.0–0.2)
nRBC: 3.3 % — ABNORMAL HIGH (ref 0.0–0.2)
nRBC: 3.7 % — ABNORMAL HIGH (ref 0.0–0.2)

## 2023-07-02 LAB — POCT I-STAT 7, (LYTES, BLD GAS, ICA,H+H)
Acid-Base Excess: 2 mmol/L (ref 0.0–2.0)
Bicarbonate: 25.6 mmol/L (ref 20.0–28.0)
Calcium, Ion: 1.07 mmol/L — ABNORMAL LOW (ref 1.15–1.40)
HCT: 20 % — ABNORMAL LOW (ref 39.0–52.0)
Hemoglobin: 6.8 g/dL — CL (ref 13.0–17.0)
O2 Saturation: 99 %
Patient temperature: 37
Potassium: 4.9 mmol/L (ref 3.5–5.1)
Sodium: 129 mmol/L — ABNORMAL LOW (ref 135–145)
TCO2: 27 mmol/L (ref 22–32)
pCO2 arterial: 33.8 mm[Hg] (ref 32–48)
pH, Arterial: 7.488 — ABNORMAL HIGH (ref 7.35–7.45)
pO2, Arterial: 123 mm[Hg] — ABNORMAL HIGH (ref 83–108)

## 2023-07-02 LAB — COOXEMETRY PANEL
Carboxyhemoglobin: 0.8 % (ref 0.5–1.5)
Methemoglobin: <0.7 % (ref 0.0–1.5)
O2 Saturation: 55.4 %
Total hemoglobin: 7.3 g/dL — ABNORMAL LOW (ref 12.0–16.0)

## 2023-07-02 LAB — RENAL FUNCTION PANEL
Albumin: 2.7 g/dL — ABNORMAL LOW (ref 3.5–5.0)
Albumin: 2.5 g/dL — ABNORMAL LOW (ref 3.5–5.0)
Anion gap: 7 (ref 5–15)
Anion gap: 8 (ref 5–15)
BUN: 27 mg/dL — ABNORMAL HIGH (ref 6–20)
BUN: 26 mg/dL — ABNORMAL HIGH (ref 6–20)
CO2: 25 mmol/L (ref 22–32)
CO2: 24 mmol/L (ref 22–32)
Calcium: 7.9 mg/dL — ABNORMAL LOW (ref 8.9–10.3)
Calcium: 7.8 mg/dL — ABNORMAL LOW (ref 8.9–10.3)
Chloride: 99 mmol/L (ref 98–111)
Chloride: 98 mmol/L (ref 98–111)
Creatinine, Ser: 2.49 mg/dL — ABNORMAL HIGH (ref 0.61–1.24)
Creatinine, Ser: 2.57 mg/dL — ABNORMAL HIGH (ref 0.61–1.24)
GFR, Estimated: 30 mL/min — ABNORMAL LOW (ref >60)
GFR, Estimated: 29 mL/min — ABNORMAL LOW (ref >60)
Glucose, Bld: 249 mg/dL — ABNORMAL HIGH (ref 70–99)
Glucose, Bld: 293 mg/dL — ABNORMAL HIGH (ref 70–99)
Phosphorus: 2.2 mg/dL — ABNORMAL LOW (ref 2.5–4.6)
Phosphorus: 2.4 mg/dL — ABNORMAL LOW (ref 2.5–4.6)
Potassium: 4.7 mmol/L (ref 3.5–5.1)
Potassium: 4.5 mmol/L (ref 3.5–5.1)
Sodium: 131 mmol/L — ABNORMAL LOW (ref 135–145)
Sodium: 130 mmol/L — ABNORMAL LOW (ref 135–145)

## 2023-07-02 LAB — CG4 I-STAT (LACTIC ACID)
Lactic Acid, Venous: 2.2 mmol/L (ref 0.5–1.9)
Lactic Acid, Venous: 1.3 mmol/L (ref 0.5–1.9)

## 2023-07-02 LAB — GLUCOSE, CAPILLARY
Glucose-Capillary: 247 mg/dL — ABNORMAL HIGH (ref 70–99)
Glucose-Capillary: 259 mg/dL — ABNORMAL HIGH (ref 70–99)
Glucose-Capillary: 267 mg/dL — ABNORMAL HIGH (ref 70–99)
Glucose-Capillary: 275 mg/dL — ABNORMAL HIGH (ref 70–99)
Glucose-Capillary: 298 mg/dL — ABNORMAL HIGH (ref 70–99)
Glucose-Capillary: 306 mg/dL — ABNORMAL HIGH (ref 70–99)

## 2023-07-02 LAB — HEPATIC FUNCTION PANEL
ALT: 25 U/L (ref 0–44)
AST: 34 U/L (ref 15–41)
Albumin: 2.6 g/dL — ABNORMAL LOW (ref 3.5–5.0)
Alkaline Phosphatase: 53 U/L (ref 38–126)
Bilirubin, Direct: <0.1 mg/dL (ref 0.0–0.2)
Total Bilirubin: 0.6 mg/dL (ref 0.0–1.2)
Total Protein: 6.1 g/dL — ABNORMAL LOW (ref 6.5–8.1)

## 2023-07-02 LAB — COMPREHENSIVE METABOLIC PANEL
ALT: 23 U/L (ref 0–44)
AST: 30 U/L (ref 15–41)
Albumin: 2.6 g/dL — ABNORMAL LOW (ref 3.5–5.0)
Alkaline Phosphatase: 53 U/L (ref 38–126)
Anion gap: 11 (ref 5–15)
BUN: 25 mg/dL — ABNORMAL HIGH (ref 6–20)
CO2: 22 mmol/L (ref 22–32)
Calcium: 8.1 mg/dL — ABNORMAL LOW (ref 8.9–10.3)
Chloride: 98 mmol/L (ref 98–111)
Creatinine, Ser: 2.38 mg/dL — ABNORMAL HIGH (ref 0.61–1.24)
GFR, Estimated: 32 mL/min — ABNORMAL LOW (ref >60)
Glucose, Bld: 308 mg/dL — ABNORMAL HIGH (ref 70–99)
Potassium: 4.4 mmol/L (ref 3.5–5.1)
Sodium: 131 mmol/L — ABNORMAL LOW (ref 135–145)
Total Bilirubin: 0.5 mg/dL (ref 0.0–1.2)
Total Protein: 6.1 g/dL — ABNORMAL LOW (ref 6.5–8.1)

## 2023-07-02 LAB — MAGNESIUM: Magnesium: 3.2 mg/dL — ABNORMAL HIGH (ref 1.7–2.4)

## 2023-07-02 LAB — AMMONIA: Ammonia: 23 umol/L (ref 9–35)

## 2023-07-02 LAB — PROCALCITONIN: Procalcitonin: 0.34 ng/mL

## 2023-07-02 MED ORDER — SODIUM BICARBONATE 8.4 % IV SOLN
INTRAVENOUS | Status: AC
  Filled 2023-07-02: qty 50

## 2023-07-02 MED ORDER — POLYETHYLENE GLYCOL 3350 17 G PO PACK: 17.0000 g | PACK | Freq: Every day | ORAL | Status: DC | PRN

## 2023-07-02 MED ORDER — K PHOS MONO-SOD PHOS DI & MONO 155-852-130 MG PO TABS
500.0000 mg | ORAL_TABLET | ORAL | Status: AC
  Administered 2023-07-02 (×2): 500 mg via ORAL
  Filled 2023-07-02 (×2): qty 2

## 2023-07-02 MED ORDER — PIPERACILLIN-TAZOBACTAM 3.375 G IVPB
3.3750 g | Freq: Four times a day (QID) | INTRAVENOUS | Status: DC
  Administered 2023-07-02 – 2023-07-04 (×9): 3.375 g via INTRAVENOUS
  Filled 2023-07-02 (×9): qty 50

## 2023-07-02 MED ORDER — LIDOCAINE HCL (PF) 1 % IJ SOLN
5.0000 mL | Freq: Once | INTRAMUSCULAR | Status: AC
  Administered 2023-07-02: 5 mL via INTRADERMAL
  Filled 2023-07-02: qty 5

## 2023-07-02 MED ORDER — HYDROMORPHONE HCL 1 MG/ML IJ SOLN
0.5000 mg | Freq: Once | INTRAMUSCULAR | Status: AC | PRN
  Administered 2023-07-03: 0.5 mg via INTRAVENOUS
  Filled 2023-07-02 (×3): qty 0.5

## 2023-07-02 MED ORDER — DOXYCYCLINE HYCLATE 100 MG IV SOLR
100.0000 mg | Freq: Two times a day (BID) | INTRAVENOUS | Status: DC
  Administered 2023-07-02 – 2023-07-04 (×5): 100 mg via INTRAVENOUS
  Filled 2023-07-02 (×6): qty 100

## 2023-07-02 MED ORDER — INSULIN ASPART 100 UNIT/ML IJ SOLN
10.0000 [IU] | INTRAMUSCULAR | Status: DC
  Administered 2023-07-02 – 2023-07-03 (×6): 10 [IU] via SUBCUTANEOUS

## 2023-07-02 MED ORDER — EPINEPHRINE 1 MG/10ML IJ SOSY
PREFILLED_SYRINGE | INTRAMUSCULAR | Status: AC
  Filled 2023-07-02: qty 10

## 2023-07-02 MED ORDER — ALBUMIN HUMAN 5 % IV SOLN
INTRAVENOUS | Status: AC
  Filled 2023-07-02: qty 250

## 2023-07-02 MED ORDER — NOREPINEPHRINE 16 MG/250ML-% IV SOLN
0.0000 ug/min | INTRAVENOUS | Status: DC
  Administered 2023-07-02: 6 ug/min via INTRAVENOUS
  Administered 2023-07-04: 10 ug/min via INTRAVENOUS
  Filled 2023-07-02 (×2): qty 250

## 2023-07-02 MED ORDER — ORAL CARE MOUTH RINSE
15.0000 mL | OROMUCOSAL | Status: DC | PRN
Start: 2023-07-02 — End: 2023-07-04

## 2023-07-02 MED ORDER — CALCIUM GLUCONATE-NACL 1-0.675 GM/50ML-% IV SOLN
1.0000 g | Freq: Once | INTRAVENOUS | Status: AC
  Administered 2023-07-02: 1000 mg via INTRAVENOUS
  Filled 2023-07-02: qty 50

## 2023-07-02 MED ORDER — INSULIN GLARGINE-YFGN 100 UNIT/ML ~~LOC~~ SOLN
35.0000 [IU] | Freq: Every day | SUBCUTANEOUS | Status: DC
  Administered 2023-07-02 – 2023-07-03 (×2): 35 [IU] via SUBCUTANEOUS
  Filled 2023-07-02 (×2): qty 0.35

## 2023-07-02 NOTE — Consult Note (Addendum)
 NAME:  Gabriel Shaw, MRN:  [id number], DOB:  [birth year], LOS: 7 ADMISSION DATE:  06/25/2023, CONSULTATION DATE:  1/3 REFERRING MD:  Rolan, CHIEF COMPLAINT:  encephalopathy   History of Present Illness:  Gabriel Shaw is a 53 y/o gentleman with a history of Jehovah's Witness, DM, diabetic foot wound on R foot who presented with late STEMI on 12/27 with 2 days of chest pain that had suddenly worsened the morning of presentation.  At presentation he underwent left heart cath demonstrating multivessel disease which was unable to be intervened upon severe stenosis and inability to cross with a wire.  During left heart cath he had an Impella CP placed with started on norepinephrine .  He was transferred from [facility] to [facility] and has remained on heparin , Impella with bicarb purge.  He initially did complication groin hematoma which is improved.  He has not received blood products due to being a Jehovah's Witness.  He had been improving with decreasing norepinephrine  requirements; only requiring milrinone  and no longer requiring vasopressors.  And was feeling well yesterday.  Today he is lethargic and complaining of abdominal pain.  Abdominal pain is most notable in the right upper quadrant, but he endorses pain all over.  He has been continued on CRRT since 12/31.  Earlier this admission he completed 5 days of ceftriaxone  empirically, which was discontinued on 1/1.  Pertinent  Medical History  Diabetes Hypertension Hyperlipidemia GERD [place] foot wound  Significant Hospital Events: Including procedures, antibiotic start and stop dates in addition to other pertinent events   1227 admission, left heart cath unable to be revascularized 12/28 ceftriaxone  started, completed course on 1/1 1/2 swan removed 1/3 started doxy, zosyn   Interim History / Subjective:    Objective   Blood pressure (!) 70/57, pulse 68, temperature (!) 97.3 F (36.3 C), temperature source Oral, resp. rate (!) 26, height 6' 3  (1.905 m), weight 98.6 kg, SpO2 100%. PAP: (30-38)/(9-14) 30/13 CVP:  [4 mmHg-14 mmHg] 14 mmHg      Intake/Output Summary (Last 24 hours) at 07/02/2023 0857 Last data filed at 07/02/2023 0800 Gross per 24 hour  Intake 4439.23 ml  Output 4025 ml  Net 414.23 ml   Filed Weights   06/30/23 0500 07/01/23 0343 07/02/23 0256  Weight: 92.7 kg 99.8 kg 98.6 kg    Examination: General: Ill-appearing man lying in bed, leaning towards his left side due to pain HENT: [place]/AT Lungs: Mild tachypnea, breathing comfortably on nasal cannula, no rales or wheezing Cardiovascular: S1-S2, regular rate and rhythm.  Impella in right groin without hematoma or active bleeding Abdomen: Very tender to palpation, guarding in both upper quadrants.  Tender with even light palpation from a stethoscope.  Hypoactive bowel sounds. Extremities: No cyanosis.  Right foot wound with some degree of tunneling but no erythema around the site Neuro: Awake, alert, able to answer questions and follow commands during exam.  Pain limits exam.  Derm: warm, no rashes  CXR personally reviewed: no infiltrates, no significant changes from prior.  Resolved Hospital Problem list     Assessment & Plan:  Acute encephalopathy, concerned that he has an acute infection or metabolic source related to his abdominal pain -Check ammonia - Check lactic acid and empirically treat for sepsis.  Blood cultures ordered this morning.  Severe abdominal pain with guarding - Stat CT -Check troponin to rule out active infarction - Check lactic acid, CBC - Empiric antibiotics-starting Zosyn  and Doxy -Dilaudid  as needed for pain control  Jehovah's  Witness - See separate documentation-discussed with him this morning he is okay with albumin  but not other blood products.  Discussed with his mother who was Gabriel Shaw with additional family members.  He had capacity to make this decision during my exam.  He understands that we will not use this intervention  unless required.  Acute blood loss anemia due to groin hematoma - No blood transfusions due to Jehovah's Witness - Limit blood draws is much as feasible - EPO previously given this admission on 12/30; iron  administered 12/31 (2g)  Acute HFrEF due to ischemic cardiomyopathy due to late presenting STEMI without revascularization LV apical thrombus Afib, new onset - Impella and milrinone  per advanced heart failure team - Continue heparin , if evidence of bleeding on CT scan may have to discontinue this -Bicarb ARDS for Impella - aspirin  - Statin -Holding guideline directed medical therapy including beta-blocker due to hypotension and low output heart failure - Vasopressors as required to maintain MAP greater than 65, increasing nor epi today -Unable to upsized to Impella 5.5 until apical thrombus has resolved.  Not currently an LVAD candidate due to Jehovah's Witness status and renal failure. -Continue amiodarone  -Will place a-line this morning after CT scan  AKI, likely ATN due to cardiogenic shock - CRRT per nephrology - Renally dose meds and avoid nephrotoxic meds - Strict I's/O  Risk for malnutrition -Self discontinued core track, would not replace with active abdominal pain - N.p.o. for now  Diabetic foot ulcer R foot, present on presentation -Appreciate care's management, does not acutely appear infected  Diabetes-uncontrolled - Increase Semglee  to 35 units daily - Tube feed coverage increase, but not currently eating or getting tube feeds - Continue sliding scale insulin  -Anticipate need for increase long-acting since he is not getting tube feeding coverage today  Regurgitation earlier in admission, has h/o GERD but concern for gastroparesis -needs gastric emptying study when stable  Mother updated via phone.  Best Practice (right click and Reselect all SmartList Selections daily)   Diet/type: NPO DVT prophylaxis systemic heparin  Pressure ulcer(s): present on  admission - diabetic foot ulcer GI prophylaxis: PPI Lines: Central line and Dialysis Catheter Foley:  Yes, and it is still needed Code Status:  full code Last date of multidisciplinary goals of care discussion [1/3]  Labs   CBC: Recent Labs  Lab 06/28/23 0408 06/29/23 0505 06/30/23 0417 07/01/23 0435 07/02/23 0455  WBC 16.5* 14.1* 13.4* 15.7* 18.4*  HGB 7.9* 7.6* 7.3* 7.4* 7.1*  HCT 22.7* 21.9* 21.1* 21.7* 21.6*  MCV 81.9 83.9 84.1 86.1 87.1  PLT 169 141* 119* 120* 101*    Basic Metabolic Panel: Recent Labs  Lab 06/30/23 0417 06/30/23 1616 07/01/23 0435 07/01/23 1729 07/01/23 2334 07/02/23 0000 07/02/23 0455  NA 133*  132* 133* 132* 132* 131*  --  131*  K 4.2  4.1 4.3 4.5 4.7 4.4  --  4.7  CL 99  100 99 99 98 98  --  99  CO2 26  25 26 27 25 22   --  25  GLUCOSE 236*  233* 269* 293* 236* 308*  --  249*  BUN 26*  24* 22* 20 22* 25*  --  27*  CREATININE 2.57*  2.47* 2.37* 2.28* 2.27* 2.38*  --  2.49*  CALCIUM  7.8*  7.8* 8.0* 8.0* 8.2* 8.1*  --  7.9*  MG 2.4 2.5* 2.4 2.6*  --  3.2*  --   PHOS 2.1*  2.2* 2.1*  2.1* 2.0*  2.0* 2.0*  2.1*  --   --  2.2*   GFR: Estimated Creatinine Clearance: 41.5 mL/min (A) (by C-G formula based on SCr of 2.49 mg/dL (H)). Recent Labs  Lab 06/25/23 0928 06/25/23 1320 06/26/23 0751 06/27/23 0427 06/29/23 0505 06/30/23 0417 07/01/23 0435 07/02/23 0455  PROCALCITON  --   --  0.25  --   --   --   --   --   WBC  --    < >  --    < > 14.1* 13.4* 15.7* 18.4*  LATICACIDVEN 1.4  --   --   --   --   --   --   --    < > = values in this interval not displayed.    Liver Function Tests: Recent Labs  Lab 06/27/23 0427 06/28/23 0408 06/28/23 1553 06/29/23 0505 06/29/23 1617 06/30/23 0417 06/30/23 1616 07/01/23 0435 07/01/23 1729 07/01/23 2334 07/02/23 0455  AST 72* 52*  --  39  --  37  --   --   --  30  --   ALT 33 28  --  24  --  24  --   --   --  23  --   ALKPHOS 45 44  --  43  --  48  --   --   --  53  --    BILITOT 0.8 0.6  --  0.7  --  0.7  --   --   --  0.5  --   PROT 5.6* 5.8*  --  5.5*  --  5.8*  --   --   --  6.1*  --   ALBUMIN  2.8* 2.8*   < > 2.6*   < > 2.5*  2.5* 2.5* 2.6* 2.7* 2.6* 2.7*   < > = values in this interval not displayed.   No results for input(s): LIPASE, AMYLASE in the last 168 hours. No results for input(s): AMMONIA in the last 168 hours.  ABG    Component Value Date/Time   PHART 7.306 (L) 06/25/2023 1023   PCO2ART 32.8 06/25/2023 1023   PO2ART 98 06/25/2023 1023   HCO3 18.6 (L) 06/25/2023 1023   HCO3 16.4 (L) 06/25/2023 1023   TCO2 19 (L) 06/25/2023 1206   ACIDBASEDEF 7.0 (H) 06/25/2023 1023   ACIDBASEDEF 9.0 (H) 06/25/2023 1023   O2SAT 55.4 07/02/2023 0457     Coagulation Profile: No results for input(s): INR, PROTIME in the last 168 hours.  Cardiac Enzymes: No results for input(s): CKTOTAL, CKMB, CKMBINDEX, TROPONINI in the last 168 hours.  HbA1C: Hemoglobin A1C  Date/Time Value Ref Range Status  12/23/2013 05:21 AM 10.4 (H) 4.2 - 6.3 % Final    Comment:    The American Diabetes Association recommends that a primary goal of therapy should be <7% and that physicians should reevaluate the treatment regimen in patients with HbA1c values consistently >8%.    Hgb A1c MFr Bld  Date/Time Value Ref Range Status  06/11/2023 01:34 PM 7.0 (H) 4.8 - 5.6 % Final    Comment:             Prediabetes: 5.7 - 6.4          Diabetes: >6.4          Glycemic control for adults with diabetes: <7.0   03/12/2023 09:48 AM 12.3 (H) 4.8 - 5.6 % Final    Comment:             Prediabetes: 5.7 - 6.4  Diabetes: >6.4          Glycemic control for adults with diabetes: <7.0     CBG: Recent Labs  Lab 07/01/23 1632 07/01/23 2054 07/01/23 2345 07/02/23 0519 07/02/23 0813  GLUCAP 217* 321* 267* 259* 298*    Review of Systems:   + ROS for pain diffusely, confusion, abdominal pain.  Past Medical History:  He,  has a past medical history  of Depression (1989), Diabetes mellitus without complication (HCC), Gastroesophageal reflux disease (01/31/2014), HLD (hyperlipidemia) (03/12/2023), Hypertension, ST elevation myocardial infarction (STEMI) (HCC) (06/25/2023), and Ulcer (2016).   Surgical History:   Past Surgical History:  Procedure Laterality Date   CORONARY/GRAFT ACUTE MI REVASCULARIZATION N/A 06/25/2023   Procedure: Coronary/Graft Acute MI Revascularization;  Surgeon: Darron Deatrice LABOR, MD;  Location: [facility] INVASIVE CV LAB;  Service: Cardiovascular;  Laterality: N/A;   EYE SURGERY  03/02/2023   LEFT HEART CATH AND CORONARY ANGIOGRAPHY N/A 06/25/2023   Procedure: LEFT HEART CATH AND CORONARY ANGIOGRAPHY;  Surgeon: Darron Deatrice LABOR, MD;  Location: [facility] INVASIVE CV LAB;  Service: Cardiovascular;  Laterality: N/A;   RIGHT HEART CATH N/A 06/25/2023   Procedure: RIGHT HEART CATH;  Surgeon: Darron Deatrice LABOR, MD;  Location: [facility] INVASIVE CV LAB;  Service: Cardiovascular;  Laterality: N/A;   VENTRICULAR ASSIST DEVICE INSERTION N/A 06/25/2023   Procedure: VENTRICULAR ASSIST DEVICE INSERTION;  Surgeon: Darron Deatrice LABOR, MD;  Location: [facility] INVASIVE CV LAB;  Service: Cardiovascular;  Laterality: N/A;     Social History:   reports that he quit smoking about 6 years ago. His smoking use included cigarettes, pipe, cigars, and e-cigarettes. He has a 0.3 pack-year smoking history. He has never used smokeless tobacco. He reports that he does not currently use alcohol. He reports that he does not currently use drugs after having used the following drugs: Cocaine, LSD, and Marijuana.   Family History:  His family history includes Cancer in his maternal grandmother and maternal uncle; Diabetes in his maternal grandmother and mother; Vision loss in his mother.   Allergies No Known Allergies   Home Medications  Prior to Admission medications   Medication Sig Start Date End Date Taking? Authorizing Provider  atorvastatin  (LIPITOR) 40 MG tablet  Take 1 tablet (40 mg total) by mouth daily. 03/18/23 03/17/24 Yes Herold Hadassah SQUIBB, MD  Glucagon  1 MG/0.2ML SOLN Inject 1 mg subcutaneously for severe hypoglycemia; if no response is observed within 15 minutes, an additional 1 mg dose may be administered.  Call 911. 06/16/23  Yes Herold Hadassah SQUIBB, MD  insulin  aspart (NOVOLOG ) 100 unit/mL injection Inject 10 Units into the skin 3 (three) times daily before meals.   Yes [provider]  LANTUS  SOLOSTAR 100 UNIT/ML Solostar Pen Inject 30 Units into the skin daily. 10/20/22  Yes [provider]  metFORMIN  (GLUCOPHAGE ) 500 MG tablet Take 2 tablets (1,000 mg total) by mouth 2 (two) times daily with a meal. 04/02/23 04/01/24 Yes Herold Hadassah SQUIBB, MD  OZEMPIC , 1 MG/DOSE, 4 MG/3ML SOPN INJECT 1 MG UNDER THE SKIN ONCE WEEKLY AS DIRECTED 06/17/23  Yes Herold Hadassah SQUIBB, MD  Accu-Chek Softclix Lancets lancets Use to check blood glucose daily 06/16/23   Herold Hadassah SQUIBB, MD  aspirin  81 MG chewable tablet Chew 81 mg by mouth daily. Patient not taking: Reported on 06/25/2023 09/23/22   [provider]  Blood Glucose Monitoring Suppl (ACCU-CHEK AVIVA PLUS) w/Device KIT Use to check blood glucose daily 06/16/23   Herold Hadassah SQUIBB, MD  Continuous Glucose  Receiver (DEXCOM G6 RECEIVER) DEVI Use to monitor blood glucose 04/06/23   Herold Hadassah SQUIBB, MD  Continuous Glucose Sensor (DEXCOM G6 SENSOR) MISC Change sensor every 10 days 05/18/23   Herold Hadassah SQUIBB, MD  Continuous Glucose Transmitter (DEXCOM G6 TRANSMITTER) MISC Change transmitter every 90 days 04/06/23   Herold Hadassah SQUIBB, MD  glucose blood (ACCU-CHEK AVIVA PLUS) test strip Use to check blood glucose daily 06/16/23   Herold Hadassah SQUIBB, MD     Critical care time: 45 min.     Leita SQUIBB Gaskins, DO 07/02/23 10:05 AM [facility]  For contact information, see Amion. If no response to pager, please call PCCM consult pager. After hours, 7PM- 7AM, please call Elink.

## 2023-07-02 NOTE — Procedures (Signed)
 Arterial Catheter Insertion Procedure Note  Gabriel Shaw  [id number]  April 15, 1971  Date:07/02/23  Time:12:18 PM    Provider Performing: Gabriel Shaw    Procedure: Insertion of Arterial Line (63379) with US  guidance (23062)    Indication(s): Blood pressure monitoring and/or need for frequent ABGs  Consent: Risks of the procedure as well as the alternatives and risks of each were explained to the patient and/or caregiver.  Consent for the procedure was obtained and is signed in the bedside chart   Anesthesia: 1% Lidocaine  local anesthetic   Time Out: Verified patient identification, verified procedure, site/side was marked, verified correct patient position, special equipment/implants available, medications/allergies/relevant history reviewed, required imaging and test results available.   Sterile Technique: Maximal sterile technique including full sterile barrier drape, hand hygiene, sterile gown, sterile gloves, mask, hair covering, sterile ultrasound probe cover (if used).   Procedure Description: Area of catheter insertion was cleaned with chlorhexidine  and draped in sterile fashion. With real-time ultrasound guidance an arterial catheter was placed into the right radial artery.  Appropriate arterial tracings confirmed on monitor.  Of note, from prior attempt patient developed local hematoma. This was stable and present prior to successful radial line placement.  Complications/Tolerance: None; patient tolerated the procedure well.   EBL: Minimal   Specimen(s): None  Gabriel Shaw, Gabriel Shaw 07/02/23 12:19 PM  Please see [email or web address] for pager details.  From 7A-7P if no response, please call [phone] After hours, please call ELink [phone]

## 2023-07-02 NOTE — Progress Notes (Signed)
 PHARMACY - ANTICOAGULATION CONSULT NOTE  Pharmacy Consult for heparin  Indication:  Impella CP/apical thrombus  No Known Allergies  Patient Measurements: Height: 6' 3 (190.5 cm) Weight: 98.6 kg (217 lb 6 oz) IBW/kg (Calculated) : 84.5 Heparin  Dosing Weight: 107 kg  Vital Signs: Temp: 98.4 F (36.9 C) (01/02 2300) Temp Source: Oral (01/02 2300) BP: 87/68 (01/03 0700) Pulse Rate: 81 (01/03 0700)  Labs: Recent Labs    06/30/23 0417 06/30/23 1616 07/01/23 0435 07/01/23 1729 07/01/23 2334 07/02/23 0455  HGB 7.3*  --  7.4*  --   --  7.1*  HCT 21.1*  --  21.7*  --   --  21.6*  PLT 119*  --  120*  --   --  101*  HEPARINUNFRC 0.46   < > 0.38 0.40  --  0.37  CREATININE 2.57*  2.47*   < > 2.28* 2.27* 2.38* 2.49*   < > = values in this interval not displayed.    Estimated Creatinine Clearance: 41.5 mL/min (A) (by C-G formula based on SCr of 2.49 mg/dL (H)).   Medical History: Past Medical History:  Diagnosis Date   Depression 1989   Diabetes mellitus without complication (HCC)    Gastroesophageal reflux disease 01/31/2014   HLD (hyperlipidemia) 03/12/2023   Hypertension    ST elevation myocardial infarction (STEMI) (HCC) 06/25/2023   Ulcer 2016     Assessment: 22 yoM admitted as late presenting STEMI s/p unsuccessful PCI and Impella CP placement. ECHO showing large apical thrombus. Pharmacy to manage heparin . Bicarbonate purge started. No AC PTA, HIT panel 1/1 negative.  Heparin  level stable and therapeutic at 0.37 on heparin  drip rate of 1800 u/hr. LDH continues to trend down. CBC low but stable, remaining in low 7's. No new signs of bleeding.   Goal of Therapy:  Heparin  level 0.3-0.5 units/ml Monitor platelets by anticoagulation protocol: Yes   Plan:  Continue heparin  1800 units/h Bicarbonate purge, weaning Impella Q12h heparin  level, CBC Monitor s/s bleeding   Randall Call, PharmD PGY2 Critical Care Pharmacy Resident

## 2023-07-02 NOTE — Progress Notes (Addendum)
 Trop 3220; not sure of trend yet LA 2.2 LFTs surprisingly normal  CT abd pelvis personally reviewed> no pneumonia or pleural process visible. Large GB. No free air. No RP bleeding. No fluid collections.  Serial LA, trop.  RUQ pending. A-line being placed.  Awaiting formal CT read.   Leita SHAUNNA Gaskins, DO 07/02/23 11:18 AM [facility]  For contact information, see Amion. If no response to pager, please call PCCM consult pager. After hours, 7PM- 7AM, please call Elink.    Denies pain currently. More awake.  Mother updated at bedside. Discussed albumin  with Mr. Mcgeehan and his mother-- both are ok with albumin  if he developed hemorrhagic shock. RN present during discussion.  Leita SHAUNNA Gaskins, DO 07/02/23 4:26 PM [facility]  For contact information, see Amion. If no response to pager, please call PCCM consult pager. After hours, 7PM- 7AM, please call Elink.

## 2023-07-02 NOTE — Plan of Care (Signed)
   Problem: Education: Goal: Knowledge of General Education information will improve Description: Including pain rating scale, medication(s)/side effects and non-pharmacologic comfort measures Outcome: Progressing   Problem: Safety: Goal: Ability to remain free from injury will improve Outcome: Progressing

## 2023-07-02 NOTE — Progress Notes (Signed)
 PHARMACY - ANTICOAGULATION CONSULT NOTE  Pharmacy Consult for heparin  Indication:  Impella CP/apical thrombus  Allergies  Allergen Reactions   Whole Blood Other (See Comments)    Jehovah's witness -  no blood products - patient okay with albumin     Patient Measurements: Height: 6' 3 (190.5 cm) Weight: 98.6 kg (217 lb 6 oz) IBW/kg (Calculated) : 84.5 Heparin  Dosing Weight: 107 kg  Vital Signs: Temp: 99.4 F (37.4 C) (01/03 1545) Temp Source: Axillary (01/03 1545) BP: 97/69 (01/03 1700) Pulse Rate: 81 (01/03 1700)  Labs: Recent Labs    07/01/23 0435 07/01/23 1729 07/01/23 2334 07/02/23 0455 07/02/23 0856 07/02/23 0938 07/02/23 1328 07/02/23 1507 07/02/23 1715  HGB 7.4*  --   --  7.1*  --  7.2* 6.8*  --   --   HCT 21.7*  --   --  21.6*  --  21.4* 20.0*  --   --   PLT 120*  --   --  101*  --  100*  --   --   --   HEPARINUNFRC 0.38 0.40  --  0.37  --   --   --   --  0.37  CREATININE 2.28* 2.27* 2.38* 2.49*  --   --   --  2.57*  --   TROPONINIHS  --   --   --   --  3,220*  --   --  6,722*  --     Estimated Creatinine Clearance: 40.2 mL/min (A) (by C-G formula based on SCr of 2.57 mg/dL (H)).   Medical History: Past Medical History:  Diagnosis Date   Depression 1989   Diabetes mellitus without complication (HCC)    Gastroesophageal reflux disease 01/31/2014   Heart failure (HCC) 07/02/2023   HLD (hyperlipidemia) 03/12/2023   Hypertension    ST elevation myocardial infarction (STEMI) (HCC) 06/25/2023   Ulcer 2016     Assessment: 52 yoM admitted as late presenting STEMI s/p unsuccessful PCI and Impella CP placement. ECHO showing large apical thrombus. Pharmacy to manage heparin . Bicarbonate purge started. No AC PTA, HIT panel 1/1 negative.  Heparin  level stable and therapeutic at 0.37 (again on 2nd shift) on heparin  drip rate of 1800 u/hr. LDH continues to trend down. CBC low but stable, remaining in low 7's. No new signs of bleeding.   Goal of Therapy:  Heparin   level 0.3-0.5 units/ml Monitor platelets by anticoagulation protocol: Yes   Plan:  Continue heparin  1800 units/h Bicarbonate purge, weaning Impella Q12h heparin  level, CBC Monitor s/s bleeding   Rankin Sams, PharmD, BCPS, BCCCP Clinical Pharmacist

## 2023-07-02 NOTE — Plan of Care (Signed)

## 2023-07-02 NOTE — Progress Notes (Signed)
 Discussed his preferences for blood products since he is a Jehova's witness. He confirmed he would not want blood products. We discussed albumin  as a possible intervention if he has hemodynamically significant bleeding. He consented for albumin  but confirmed no blood products again. RN witnessed this discussion.   I discussed with his mother Reena that he had consented for albumin . She was unsure about this and seemed unfamiliar with it and wants to speak to the brothers about this. Not sure if this is his siblings or uncles.   Leita SHAUNNA Gaskins, DO 07/02/23 9:44 AM [facility]  For contact information, see Amion. If no response to pager, please call PCCM consult pager. After hours, 7PM- 7AM, please call Elink.

## 2023-07-02 NOTE — Progress Notes (Addendum)
 eLink Physician-Brief Progress Note Patient Name: Gabriel Shaw DOB: 26-Mar-1971 MRN: [id number]   Date of Service  07/02/2023  HPI/Events of Note  had iCa 1.07 @ 1330. 1 g Cagluc ordered but BSRN unsure if they recieved it.  eICU Interventions  Recheck with AM labs.   0101 -patient had a large BM and then subsequently developed hypotension requiring rapid escalation of norepinephrine .  This is followed by reduced level of consciousness.  The patient is still protecting his airway and does not require ventilatory support at this time.  Bedside team discussed with cardiology as well.  Unable to pull on CRRT, filter degradation within 3 to 8 hours.  Last CVP was 20 which is a jump from the rest of the day in the low teens.  Impella waveform stable, amplitude stable.  Will attempt albumin  25% 25 g bolus.  Maintain norepinephrine .  There has been recovery of his blood pressure in the meantime.  0116 - frequent liquid stools, Platelets 78. Add Flexiseal in the setting of hemodynamic effects  Intervention Category Minor Interventions: Electrolytes abnormality - evaluation and management  Nevae Pinnix 07/02/2023, 11:43 PM

## 2023-07-02 NOTE — Progress Notes (Addendum)
 THIS NOTE IS FROM 07/02/22.  Accidentally addended 07/01/22 note.    Patient ID: Gabriel Shaw, male   DOB: 1970-07-19, 53 y.o.   MRN: [id number]     Advanced Heart Failure Rounding Note  Cardiologist: None   Chief Complaint: Cardiogenic shock  Subjective:    Remains on milrinone  0.375, NE 7 this morning.  Co-ox 67%.  CVP 11, I/Os even via CVVH over the last day.   Abdominal pain, concern for infection.  CT abdomen/pelvis and abdominal US  1/3, no definite gallbladder pathology and LFTs normal.  CT showed mild air-fluid levels within distal transverse and descending portions of colon (nonspecific, can bee seen with diarrheal diseases).  He is now on Zosyn  and doxycycline , WBCs 18 => 20K.  Afebrile.   Impella CP: P8 Flow 3.3 L/min No alarms LDH 629 => 644  Hgb 6.8 (slow decrease) Plts 101 => 72.  HIT negative on 12/31.  Filter clotting with CVVH.   Amiodarone  at 60 mg/hr due to runs of NSVT.  Having episodes of chest pain/nausea when lying back.  Still with some abdominal pain and also having watery stools.    Objective:   Weight Range: 98.6 kg Body mass index is 27.17 kg/m.   Vital Signs:   Temp:  [98 F (36.7 C)-99.3 F (37.4 C)] 98.4 F (36.9 C) (01/02 2300) Pulse Rate:  [71-98] 88 (01/03 0730) Resp:  [8-29] 23 (01/03 0730) BP: (70-115)/(44-84) 97/60 (01/03 0730) SpO2:  [87 %-100 %] 100 % (01/03 0730) Weight:  [98.6 kg] 98.6 kg (01/03 0256) Last BM Date : 07/01/23  Weight change: Filed Weights   06/30/23 0500 07/01/23 0343 07/02/23 0256  Weight: 92.7 kg 99.8 kg 98.6 kg    Intake/Output:   Intake/Output Summary (Last 24 hours) at 07/02/2023 0807 Last data filed at 07/02/2023 0700 Gross per 24 hour  Intake 4300.67 ml  Output 3837 ml  Net 463.67 ml      Physical Exam   General: Nausea currently.  Neck: JVP 10 cm, no thyromegaly or thyroid  nodule.  Lungs: Decreased at bases.  CV: Nondisplaced PMI.  Heart regular S1/S2, no S3/S4, no murmur.  No peripheral  edema.    Abdomen: Soft, mildly tender, no hepatosplenomegaly, no distention.  Skin: Intact without lesions or rashes.  Neurologic: Alert and oriented x 3.  Psych: Normal affect. Extremities: No clubbing or cyanosis.  HEENT: Normal.   Telemetry   NSR 80s, personally reviewed.   Labs    CBC Recent Labs    07/01/23 0435 07/02/23 0455  WBC 15.7* 18.4*  HGB 7.4* 7.1*  HCT 21.7* 21.6*  MCV 86.1 87.1  PLT 120* 101*   Basic Metabolic Panel Recent Labs    98/97/74 1729 07/01/23 2334 07/02/23 0000 07/02/23 0455  NA 132* 131*  --  131*  K 4.7 4.4  --  4.7  CL 98 98  --  99  CO2 25 22  --  25  GLUCOSE 236* 308*  --  249*  BUN 22* 25*  --  27*  CREATININE 2.27* 2.38*  --  2.49*  CALCIUM  8.2* 8.1*  --  7.9*  MG 2.6*  --  3.2*  --   PHOS 2.0*  2.1*  --   --  2.2*   Liver Function Tests Recent Labs    06/30/23 0417 06/30/23 1616 07/01/23 2334 07/02/23 0455  AST 37  --  30  --   ALT 24  --  23  --   ALKPHOS 48  --  53  --   BILITOT 0.7  --  0.5  --   PROT 5.8*  --  6.1*  --   ALBUMIN  2.5*  2.5*   < > 2.6* 2.7*   < > = values in this interval not displayed.   No results for input(s): LIPASE, AMYLASE in the last 72 hours. Cardiac Enzymes No results for input(s): CKTOTAL, CKMB, CKMBINDEX, TROPONINI in the last 72 hours.  BNP: BNP (last 3 results) No results for input(s): BNP in the last 8760 hours.  ProBNP (last 3 results) No results for input(s): PROBNP in the last 8760 hours.   D-Dimer No results for input(s): DDIMER in the last 72 hours. Hemoglobin A1C No results for input(s): HGBA1C in the last 72 hours. Fasting Lipid Panel No results for input(s): CHOL, HDL, LDLCALC, TRIG, CHOLHDL, LDLDIRECT in the last 72 hours.  Thyroid  Function Tests No results for input(s): TSH, T4TOTAL, T3FREE, THYROIDAB in the last 72 hours.  Invalid input(s): FREET3  Other results:   Imaging    DG CHEST PORT 1 VIEW Result  Date: 07/01/2023 CLINICAL DATA:  Congestive heart failure. EXAM: PORTABLE CHEST 1 VIEW COMPARISON:  One view chest x-ray 06/28/2023. FINDINGS: The heart is mildly enlarged. A Swan-Ganz catheter terminates in the inter lobar artery, entering via a right IJ approach. A left IJ dialysis catheter is stable in position. Impella device is stable. No edema or effusion is present. No focal airspace disease is present. The visualized soft tissues and bony thorax are unremarkable. IMPRESSION: 1. Mild cardiomegaly without failure. 2. Support apparatus as described. Electronically Signed   By: Lonni Necessary M.D.   On: 07/01/2023 11:37      Medications:   Scheduled Medications:  aspirin  EC  81 mg Oral Daily   atorvastatin   80 mg Oral Daily   Chlorhexidine  Gluconate Cloth  6 each Topical Daily   feeding supplement (PROSource TF20)  60 mL Per Tube TID   insulin  aspart  0-20 Units Subcutaneous Q4H   insulin  aspart  8 Units Subcutaneous Q4H   insulin  glargine-yfgn  27 Units Subcutaneous Daily   metoCLOPramide  (REGLAN ) injection  10 mg Intravenous Q8H   multivitamin  1 tablet Oral BID   mouth rinse  15 mL Mouth Rinse 4 times per day   pantoprazole   40 mg Oral Daily   polyethylene glycol  17 g Oral Daily   senna-docusate  1 tablet Oral BID   sodium chloride  flush  3 mL Intravenous Q12H    Infusions:   prismasol  BGK 4/2.5 500 mL/hr at 07/01/23 2332    prismasol  BGK 4/2.5 400 mL/hr at 07/01/23 2344   amiodarone  60 mg/hr (07/02/23 0700)   feeding supplement (VITAL 1.5 CAL) 1,000 mL (07/02/23 0742)   heparin  1,800 Units/hr (07/02/23 0700)   milrinone  0.375 mcg/kg/min (07/02/23 0700)   norepinephrine  (LEVOPHED ) Adult infusion Stopped (07/01/23 0102)   prismasol  BGK 4/2.5 2,000 mL/hr at 07/02/23 0750   promethazine  (PHENERGAN ) injection (IM or IVPB) 12.5 mg (06/27/23 2030)   sodium bicarbonate  25 mEq (Impella PURGE) in dextrose  5 % 1000 mL bag      PRN Medications: acetaminophen , ALPRAZolam ,  chlorproMAZINE  (THORAZINE ) injection, haloperidol  lactate, heparin , ondansetron  (ZOFRAN ) IV, mouth rinse, mouth rinse, mouth rinse, promethazine  (PHENERGAN ) injection (IM or IVPB), sodium chloride , sodium chloride  flush  Assessment/Plan   1. CAD: Late presenting anterior/inferior STEMI/CAD.  70% mid LAD, 100% m-dLAD, 90% pRCA, 90% mRCA, 70% LPAV.  Disease not amenable to PCI, failed attempt to cross LAD occlusion x  3.  Medical management.  He has had episodes of chest pain/nausea when lying back, relieved with lies on side.  HS-TnI 1/3  showed down-trend from initial MI.  Suspect this may be GERD-related and not ischemic pain.  - ASA + statin   - no ? blocker or Imdur with shock.  - For current pain, ?GERD => Protonix , will try GI cocktail.  2. Acute Systolic Heart Failure/Cardiogenic shock: Ischemic cardiomyopathy, limited echo 12/29 with EF 25% with LAD territory WMAs, LV thrombus, RV normal. Worry that he has nonviable LAD-territory myocardium. Initial RHC with mean RA 6, mean PCWP 20, CI 1.93.  Had been weaning Impella until 1/3, then became lethargic with watery stool and abdominal pain, BP dropped (?septic shock/distributive shock component) and NE restarted with Impella back to P8.  Impella CP at P8 with flow 3.3 L/min. Also on milrinone  0.375, NE 7. CO-OX 67%, CVP 11 today.  Have been running CVVH even with low BP.  - Keep Impella at P8 today, check position by echo.  - Try to run CVVH slighly negative (up to 50 cc/hr net negative UF if tolerated).  - On heparin  gtt with Impella and LV thrombus => with platelets continuing to fall and clotting of CVVH filter repetitively on heparin , will change to bivalirudin .  - Titrate NE as able to keep MAP > 65.  - Broad spectrum abx to cover sepsis (see below).  - OK for albumin  if needed.  - Our options were originally quite limited, we were trying to wean off Impella CP, and if not able to wean off, would consider Impella 5.5 to give him more time if we  could document resolution of apical thrombus.  However, now back to P8 on Impella CP and more pressor support with possible septic shock component.  He is not a candidate for LVAD with AKI/CVVH + Je[employer] not accepting blood products.  I worry that his prognosis is getting increasingly poor.  We discussed code status today, he wants to remain full code, discussed with his mother and him today.  Will also make palliative care consult.   3. AKI: SCr baseline 1.3.  Suspect ATN in setting of cardiogenic shock.  Now on CVVH, awaiting renal recovery.   - Maintain CO and MAP.  - Will try to pull gently net negative as above.  4. Elevated LFTs: Now normal.  5. ID: Completed ceftriaxone  for possible aspiration. 1/3 started on Zosyn  + doxycycline  over concern for septic shock.  Possible abdominal source given symptoms. CT abdomen/pelvis and abdominal US  1/3, no definite gallbladder pathology and LFTs normal.  CT showed mild air-fluid levels within distal transverse and descending portions of colon (nonspecific, can bee seen with diarrheal diseases).  Still with loose stool and some abdominal discomfort. WBCs 20K today.  - Continue Zosyn /doxycycline .  6. Type 2DM: SSI. 7. Anemia: Blood loss at Impella site initially, seems to have stopped.  Hgb lower today at 6.8. Je[employer] Witness so no blood products.  - he would NOT accept any blood products even in a life and death situation except for albumin .  - Has had IV Fe and Aranesp .  8. LV thrombus: On heparin  gtt. Still noted on 1/1 echo.  With ongoing drop in platelets and repetitive clotting of CVVH filter, changing to bivalirudin .  9. Atrial fibrillation: Noted on 12/29. Converted with amiodarone  gtt. Continue amiodarone  at 60/hr. - Anticoagulation.  10. FEN: Tube feeds via Cortrack ongoing due to poor appetite/nausea.   11. Thrombocytopenia: May  be due to Impella.  HIT negative on 12/31 but platelets now falling again, 101 => 72.  - Check Impella  position.  - Discussed with pharmacy, will transition to bivalirudin .  12. Diabetic foot ulcer: Right foot.  Follows with ortho and podiatry in outpatient setting. - WOC has seen 13. NSVT/PVCs: More frequent, now on amiodarone  gtt at 60 mg/hr.   CRITICAL CARE Performed by: Ezra Shuck  Total critical care time: 50 minutes  Critical care time was exclusive of separately billable procedures and treating other patients.  Critical care was necessary to treat or prevent imminent or life-threatening deterioration.  Critical care was time spent personally by me on the following activities: development of treatment plan with patient and/or surrogate as well as nursing, discussions with consultants, evaluation of patient's response to treatment, examination of patient, obtaining history from patient or surrogate, ordering and performing treatments and interventions, ordering and review of laboratory studies, ordering and review of radiographic studies, pulse oximetry and re-evaluation of patient's condition.  Ezra Shuck 07/02/2023

## 2023-07-02 NOTE — Progress Notes (Signed)
 Nutrition Follow-up  DOCUMENTATION CODES:   Non-severe (moderate) malnutrition in context of chronic illness  INTERVENTION:   Tube Feeding via Cortrak: Vital 1.5 at 20 ml/hr with goal of 65 ml/hr Pro-source TF20 60 mL TID TF at goal provides 2580 kcals, 165 g of protein and 1186 mL of free water  Continue Soft diet as tolerated  Continue Renal MVI  Consider resuming Reglan  if pt experiencing nausea, regurgitation, vomiting, etc.   Continue calorie count thru the weekend   NUTRITION DIAGNOSIS:   Moderate Malnutrition related to chronic illness as evidenced by mild fat depletion, mild muscle depletion, energy intake < 75% for > or equal to 1 month.  Being addressed   GOAL:   Patient will meet greater than or equal to 90% of their needs  Progressing  MONITOR:   TF tolerance, Labs, Weight trends, Skin, PO intake  REASON FOR ASSESSMENT:   Consult Assessment of nutrition requirement/status, Poor PO (CRRT)  ASSESSMENT:   53 yo male admitted with NSTEMI and cardiogenic shock, CAD with failed PCI, Impella CP placed. PMH includes DM, HTN, GERD, HLD, GI ulcer  12/27 R/LHC, failed PCI, Impella CP placement, Transfer to [facility]. RHC consistent with LV dominant shock  12/30 CRRT initiated, Cortrak placed, TF initiated, reglan  and protonix  started  Remains on CRRT, Impella at P5 this AM but increased back to P8, levophed  restarted at 4  Pt more lethargic today per RN. Calorie count initiated yesterday due to pt' requesting tube removal but pt did not eat breakfast this AM. Noted significant abdominal pain this AM.   Pt ate 0% of breakfast and lunch and 10% of dinner.   Cortrak remains in place but TF held for CT and US  CT abdomen today with sigmoid diverticulosis without diverticulitis , US  abdomen pending  Remains on protonix , reglan  no longer ordered.   CBGs in 200-300s despite TF being on hold and not eating today. Recommend considering resuming insulin   drip  Labs: sodium 130 (L), potassium 4.5 (wdl), phosphorus 2.4 (L) MedS: ss novolog , novolog  q 4 hours, semglee , renal MVI BID  Diet Order:   Diet Order             DIET SOFT Room service appropriate? Yes with Assist; Fluid consistency: Thin; Fluid restriction: 1800 mL Fluid  Diet effective now                   EDUCATION NEEDS:   Education needs have been addressed  Skin:  Skin Assessment: Reviewed RN Assessment  Last BM:  1/02 type 6  Height:   Ht Readings from Last 1 Encounters:  06/25/23 6' 3 (1.905 m)    Weight:   Wt Readings from Last 1 Encounters:  07/02/23 98.6 kg    Ideal Body Weight:     BMI:  Body mass index is 27.17 kg/m.  Estimated Nutritional Needs:   Kcal:  2500-2700 kcals  Protein:  135-170 g  Fluid:  1.8 L   Betsey Finger MS, RDN, LDN, CNSC Registered Dietitian 3 Clinical Nutrition RD Inpatient Contact Info in Amion

## 2023-07-02 NOTE — Progress Notes (Signed)
 [facility] Progress Note   Assessment/ Plan:   Assessment/Plan: 53 year old male with hypertension, diabetes, and mild CKD.  He now presents late after MI and is suffering from cardiogenic shock complicated by acute renal failure 1.Renal-does have some mild CKD at baseline.  Also at least 1 episode in the past of AKI.  Urinalyses in the past have shown proteinuria-also for some reason had 1 done on 12/13 that showed some microscopic hematuria..  Will check urine here today for completeness sake but suspect that major etiology of his acute kidney injury is hemodynamic in nature in the setting of cardiogenic shock.  Has an Impella in place and is on norepinephrine  and milrinone  to reinforce blood pressure and kidney perfusion.   - did not respond to Lasix  challenge - started CRRT 06/28/23 - all 4K bath, no heparin  as he is on systemic gtt - gentle net neg - no blood products, on iron  and ESA  2. Hypertension/volume  -UF as tolerated  3.  Cardiovascular-significant issue with multivessel CAD that was unable to be intervened upon.  - impella, milrinone , norepi - per AHF, going to wean milrinone  today  4. Anemia  -supportive care at this time  - start Aranesp   -IV iron   5.  R foot ulcer  - tells me that he has arterial dopplers scheduled as OP--> probably should do them here when feasible  6.  LV thrombus - hep gtt  Subjective:   Agitated, restless today.  On P8 for impella now, CRRT running even   Objective:   BP (!) 72/61   Pulse 74   Temp (!) 97.3 F (36.3 C) (Oral)   Resp (!) 24   Ht 6' 3 (1.905 m)   Wt 98.6 kg   SpO2 97%   BMI 27.17 kg/m   Intake/Output Summary (Last 24 hours) at 07/02/2023 9061 Last data filed at 07/02/2023 0900 Gross per 24 hour  Intake 4457.97 ml  Output 4055 ml  Net 402.97 ml   Weight change: -1.2 kg  Physical Exam: Gen: lying in bed, appears ill CVS: RRR Resp clear anteriorly Abd: mildly distended Ext: 1+ LE edema ACCESS; L  internal jugular nontunneled HD cath  Imaging: DG CHEST PORT 1 VIEW Result Date: 07/01/2023 CLINICAL DATA:  Congestive heart failure. EXAM: PORTABLE CHEST 1 VIEW COMPARISON:  One view chest x-ray 06/28/2023. FINDINGS: The heart is mildly enlarged. A Swan-Ganz catheter terminates in the inter lobar artery, entering via a right IJ approach. A left IJ dialysis catheter is stable in position. Impella device is stable. No edema or effusion is present. No focal airspace disease is present. The visualized soft tissues and bony thorax are unremarkable. IMPRESSION: 1. Mild cardiomegaly without failure. 2. Support apparatus as described. Electronically Signed   By: Lonni Necessary M.D.   On: 07/01/2023 11:37    Labs: BMET Recent Labs  Lab 06/29/23 0505 06/29/23 1617 06/30/23 0417 06/30/23 1616 07/01/23 0435 07/01/23 1729 07/01/23 2334 07/02/23 0455  NA 134* 133* 133*  132* 133* 132* 132* 131* 131*  K 3.7 4.0 4.2  4.1 4.3 4.5 4.7 4.4 4.7  CL 103 101 99  100 99 99 98 98 99  CO2 24 23 26  25 26 27 25 22 25   GLUCOSE 225* 206* 236*  233* 269* 293* 236* 308* 249*  BUN 37* 28* 26*  24* 22* 20 22* 25* 27*  CREATININE 3.44* 2.80* 2.57*  2.47* 2.37* 2.28* 2.27* 2.38* 2.49*  CALCIUM  7.8* 7.8* 7.8*  7.8*  8.0* 8.0* 8.2* 8.1* 7.9*  PHOS 2.8 2.3* 2.1*  2.2* 2.1*  2.1* 2.0*  2.0* 2.0*  2.1*  --  2.2*   CBC Recent Labs  Lab 06/29/23 0505 06/30/23 0417 07/01/23 0435 07/02/23 0455  WBC 14.1* 13.4* 15.7* 18.4*  HGB 7.6* 7.3* 7.4* 7.1*  HCT 21.9* 21.1* 21.7* 21.6*  MCV 83.9 84.1 86.1 87.1  PLT 141* 119* 120* 101*    Medications:     aspirin  EC  81 mg Oral Daily   atorvastatin   80 mg Oral Daily   Chlorhexidine  Gluconate Cloth  6 each Topical Daily   feeding supplement (PROSource TF20)  60 mL Per Tube TID   insulin  aspart  0-20 Units Subcutaneous Q4H   insulin  aspart  10 Units Subcutaneous Q4H   insulin  glargine-yfgn  35 Units Subcutaneous Daily   multivitamin  1 tablet Oral BID    mouth rinse  15 mL Mouth Rinse 4 times per day   pantoprazole   40 mg Oral Daily   phosphorus  500 mg Oral Q4H   polyethylene glycol  17 g Oral Daily   senna-docusate  1 tablet Oral BID   sodium chloride  flush  3 mL Intravenous Q12H    Almarie Bonine, MD 07/02/2023, 9:38 AM

## 2023-07-03 ENCOUNTER — Inpatient Hospital Stay (HOSPITAL_COMMUNITY): Payer: Medicaid Other

## 2023-07-03 DIAGNOSIS — R739 Hyperglycemia, unspecified: Secondary | ICD-10-CM

## 2023-07-03 DIAGNOSIS — I5021 Acute systolic (congestive) heart failure: Secondary | ICD-10-CM | POA: Diagnosis not present

## 2023-07-03 DIAGNOSIS — Z7189 Other specified counseling: Secondary | ICD-10-CM

## 2023-07-03 DIAGNOSIS — I25112 Atherosclerotic heart disease of native coronary artery with refractory angina pectoris: Secondary | ICD-10-CM

## 2023-07-03 DIAGNOSIS — A419 Sepsis, unspecified organism: Principal | ICD-10-CM

## 2023-07-03 DIAGNOSIS — R570 Cardiogenic shock: Secondary | ICD-10-CM | POA: Diagnosis not present

## 2023-07-03 DIAGNOSIS — R6521 Severe sepsis with septic shock: Secondary | ICD-10-CM

## 2023-07-03 DIAGNOSIS — Z515 Encounter for palliative care: Secondary | ICD-10-CM

## 2023-07-03 LAB — RENAL FUNCTION PANEL
Albumin: 3.1 g/dL — ABNORMAL LOW (ref 3.5–5.0)
Albumin: 2.9 g/dL — ABNORMAL LOW (ref 3.5–5.0)
Anion gap: 10 (ref 5–15)
Anion gap: 10 (ref 5–15)
BUN: 28 mg/dL — ABNORMAL HIGH (ref 6–20)
BUN: 26 mg/dL — ABNORMAL HIGH (ref 6–20)
CO2: 25 mmol/L (ref 22–32)
CO2: 24 mmol/L (ref 22–32)
Calcium: 8 mg/dL — ABNORMAL LOW (ref 8.9–10.3)
Calcium: 8.2 mg/dL — ABNORMAL LOW (ref 8.9–10.3)
Chloride: 97 mmol/L — ABNORMAL LOW (ref 98–111)
Chloride: 98 mmol/L (ref 98–111)
Creatinine, Ser: 2.49 mg/dL — ABNORMAL HIGH (ref 0.61–1.24)
Creatinine, Ser: 2.43 mg/dL — ABNORMAL HIGH (ref 0.61–1.24)
GFR, Estimated: 30 mL/min — ABNORMAL LOW (ref >60)
GFR, Estimated: 31 mL/min — ABNORMAL LOW (ref >60)
Glucose, Bld: 230 mg/dL — ABNORMAL HIGH (ref 70–99)
Glucose, Bld: 315 mg/dL — ABNORMAL HIGH (ref 70–99)
Phosphorus: 2.3 mg/dL — ABNORMAL LOW (ref 2.5–4.6)
Phosphorus: 1.7 mg/dL — ABNORMAL LOW (ref 2.5–4.6)
Potassium: 4.4 mmol/L (ref 3.5–5.1)
Potassium: 4.9 mmol/L (ref 3.5–5.1)
Sodium: 132 mmol/L — ABNORMAL LOW (ref 135–145)
Sodium: 132 mmol/L — ABNORMAL LOW (ref 135–145)

## 2023-07-03 LAB — HEPARIN LEVEL (UNFRACTIONATED): Heparin Unfractionated: 0.37 [IU]/mL (ref 0.30–0.70)

## 2023-07-03 LAB — GLUCOSE, CAPILLARY
Glucose-Capillary: 177 mg/dL — ABNORMAL HIGH (ref 70–99)
Glucose-Capillary: 197 mg/dL — ABNORMAL HIGH (ref 70–99)
Glucose-Capillary: 206 mg/dL — ABNORMAL HIGH (ref 70–99)
Glucose-Capillary: 235 mg/dL — ABNORMAL HIGH (ref 70–99)
Glucose-Capillary: 270 mg/dL — ABNORMAL HIGH (ref 70–99)
Glucose-Capillary: 320 mg/dL — ABNORMAL HIGH (ref 70–99)

## 2023-07-03 LAB — CBC
HCT: 20.5 % — ABNORMAL LOW (ref 39.0–52.0)
Hemoglobin: 6.8 g/dL — CL (ref 13.0–17.0)
MCH: 29.2 pg (ref 26.0–34.0)
MCHC: 33.2 g/dL (ref 30.0–36.0)
MCV: 88 fL (ref 80.0–100.0)
Platelets: 72 10*3/uL — ABNORMAL LOW (ref 150–400)
RBC: 2.33 MIL/uL — ABNORMAL LOW (ref 4.22–5.81)
RDW: 14.6 % (ref 11.5–15.5)
WBC: 20.2 10*3/uL — ABNORMAL HIGH (ref 4.0–10.5)
nRBC: 3.9 % — ABNORMAL HIGH (ref 0.0–0.2)

## 2023-07-03 LAB — COOXEMETRY PANEL
Carboxyhemoglobin: 1.4 % (ref 0.5–1.5)
Carboxyhemoglobin: 0.7 % (ref 0.5–1.5)
Carboxyhemoglobin: 1.4 % (ref 0.5–1.5)
Methemoglobin: <0.7 % (ref 0.0–1.5)
Methemoglobin: 0.7 % (ref 0.0–1.5)
Methemoglobin: 0.7 % (ref 0.0–1.5)
O2 Saturation: 91.9 %
O2 Saturation: 66.8 %
O2 Saturation: 63.5 %
Total hemoglobin: 7.2 g/dL — ABNORMAL LOW (ref 12.0–16.0)
Total hemoglobin: 7.1 g/dL — ABNORMAL LOW (ref 12.0–16.0)
Total hemoglobin: <6.9 g/dL — CL (ref 12.0–16.0)

## 2023-07-03 LAB — LACTATE DEHYDROGENASE: LDH: 644 U/L — ABNORMAL HIGH (ref 98–192)

## 2023-07-03 LAB — AMMONIA: Ammonia: 15 umol/L (ref 9–35)

## 2023-07-03 LAB — CG4 I-STAT (LACTIC ACID): Lactic Acid, Venous: 1.7 mmol/L (ref 0.5–1.9)

## 2023-07-03 LAB — ECHOCARDIOGRAM LIMITED
Height: 75 in
Weight: 3305.14 [oz_av]

## 2023-07-03 LAB — APTT
aPTT: 57 s — ABNORMAL HIGH (ref 24–36)
aPTT: 62 s — ABNORMAL HIGH (ref 24–36)

## 2023-07-03 MED ORDER — FAMOTIDINE 20 MG PO TABS
20.0000 mg | ORAL_TABLET | Freq: Every day | ORAL | Status: DC
  Administered 2023-07-03 – 2023-07-04 (×2): 20 mg via ORAL
  Filled 2023-07-03 (×2): qty 1

## 2023-07-03 MED ORDER — PRISMASOL B22GK 4/0 22-4 MEQ/L EC SOLN
Status: DC
  Filled 2023-07-03 (×4): qty 5000

## 2023-07-03 MED ORDER — PRISMASOL BGK 4/2.5 32-4-2.5 MEQ/L EC SOLN: Status: DC

## 2023-07-03 MED ORDER — DEXTROSE 5 % IV SOLN
20.0000 g | INTRAVENOUS | Status: DC
  Filled 2023-07-03: qty 200

## 2023-07-03 MED ORDER — ALUM & MAG HYDROXIDE-SIMETH 200-200-20 MG/5ML PO SUSP
30.0000 mL | ORAL | Status: DC | PRN
  Administered 2023-07-03 – 2023-07-04 (×3): 30 mL via ORAL
  Filled 2023-07-03 (×3): qty 30

## 2023-07-03 MED ORDER — ALBUMIN HUMAN 5 % IV SOLN
INTRAVENOUS | Status: AC
  Filled 2023-07-03: qty 250

## 2023-07-03 MED ORDER — HEPARIN SODIUM (PORCINE) 1000 UNIT/ML DIALYSIS: 1000.0000 [IU] | INTRAMUSCULAR | Status: DC | PRN

## 2023-07-03 MED ORDER — AMIODARONE LOAD VIA INFUSION
150.0000 mg | Freq: Once | INTRAVENOUS | Status: AC
  Administered 2023-07-03: 150 mg via INTRAVENOUS
  Filled 2023-07-03: qty 83.34

## 2023-07-03 MED ORDER — PRISMASOL B22GK 4/0 22-4 MEQ/L REPLACEMENT SOLN
Status: DC
  Filled 2023-07-03: qty 5000

## 2023-07-03 MED ORDER — ACD FORMULA A 0.73-2.45-2.2 GM/100ML VI SOLN
3000.0000 mL | Status: DC
Start: 2023-07-03 — End: 2023-07-03
  Filled 2023-07-03: qty 3000

## 2023-07-03 MED ORDER — INSULIN ASPART 100 UNIT/ML IJ SOLN
11.0000 [IU] | INTRAMUSCULAR | Status: DC
  Administered 2023-07-03 – 2023-07-04 (×6): 11 [IU] via SUBCUTANEOUS

## 2023-07-03 MED ORDER — ANTICOAGULANT SODIUM CITRATE 4% (200MG/5ML) IV SOLN: 5.0000 mL | Status: DC | PRN

## 2023-07-03 MED ORDER — PRISMASOL BGK 4/2.5 32-4-2.5 MEQ/L REPLACEMENT SOLN: Status: DC

## 2023-07-03 MED ORDER — K PHOS MONO-SOD PHOS DI & MONO 155-852-130 MG PO TABS
500.0000 mg | ORAL_TABLET | ORAL | Status: AC
  Administered 2023-07-03 (×2): 500 mg via ORAL
  Filled 2023-07-03 (×2): qty 2

## 2023-07-03 MED ORDER — HEPARIN SODIUM (PORCINE) 1000 UNIT/ML DIALYSIS
1000.0000 [IU] | INTRAMUSCULAR | Status: DC | PRN
  Administered 2023-07-04: 2400 [IU] via INTRAVENOUS_CENTRAL
  Filled 2023-07-03: qty 4
  Filled 2023-07-03: qty 6

## 2023-07-03 MED ORDER — CALCIUM GLUCONATE-NACL 1-0.675 GM/50ML-% IV SOLN
1.0000 g | Freq: Once | INTRAVENOUS | Status: AC
  Administered 2023-07-03: 1000 mg via INTRAVENOUS
  Filled 2023-07-03: qty 50

## 2023-07-03 MED ORDER — ALBUMIN HUMAN 25 % IV SOLN
25.0000 g | Freq: Once | INTRAVENOUS | Status: AC
  Administered 2023-07-03: 25 g via INTRAVENOUS
  Filled 2023-07-03: qty 100

## 2023-07-03 MED ORDER — INSULIN GLARGINE-YFGN 100 UNIT/ML ~~LOC~~ SOLN
15.0000 [IU] | Freq: Once | SUBCUTANEOUS | Status: AC
  Administered 2023-07-03: 15 [IU] via SUBCUTANEOUS
  Filled 2023-07-03: qty 0.15

## 2023-07-03 MED ORDER — INSULIN GLARGINE-YFGN 100 UNIT/ML ~~LOC~~ SOLN
50.0000 [IU] | Freq: Every day | SUBCUTANEOUS | Status: DC
  Filled 2023-07-03: qty 0.5

## 2023-07-03 MED ORDER — SODIUM CHLORIDE 0.9 % IV SOLN
0.0500 mg/kg/h | INTRAVENOUS | Status: DC
  Administered 2023-07-03: 0.05 mg/kg/h via INTRAVENOUS
  Filled 2023-07-03: qty 250

## 2023-07-03 NOTE — Progress Notes (Signed)
 [facility] Progress Note   Assessment/ Plan:   Assessment/Plan: 53 year old male with hypertension, diabetes, and mild CKD.  He now presents late after MI and is suffering from cardiogenic shock complicated by acute renal failure 1.Renal-does have some mild CKD at baseline.  Also at least 1 episode in the past of AKI.  Urinalyses in the past have shown proteinuria-also for some reason had 1 done on 12/13 that showed some microscopic hematuria..  Will check urine here today for completeness sake but suspect that major etiology of his acute kidney injury is hemodynamic in nature in the setting of cardiogenic shock.  Has an Impella in place and is on norepinephrine  and milrinone  to reinforce blood pressure and kidney perfusion.   - did not respond to Lasix  challenge - started CRRT 06/28/23 - all 4K bath, cltting system q shift- will start citrate - gentle net neg - no blood products, on iron  and ESA  2. Hypertension/volume  -UF as tolerated  3.  Cardiovascular-significant issue with multivessel CAD that was unable to be intervened upon.  - impella, milrinone , norepi - repeat TTE today  4. Anemia  -supportive care at this time  - start Aranesp   -IV iron   5.  R foot ulcer  6.  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  Switched from hep gtt to bival for impella.     Objective:   BP 106/69   Pulse 84   Temp 98.9 F (37.2 C) (Axillary)   Resp 19   Ht 6' 3 (1.905 m)   Wt 93.7 kg   SpO2 99%   BMI 25.82 kg/m   Intake/Output Summary (Last 24 hours) at 07/03/2023 1128 Last data filed at 07/03/2023 1100 Gross per 24 hour  Intake 4778.86 ml  Output 4936 ml  Net -157.14 ml   Weight change: -4.9 kg  Physical Exam: Gen: lying in bed, appears ill CVS: RRR Resp clear anteriorly Abd: mildly distended Ext: 1+ LE edema ACCESS; L internal jugular nontunneled HD cath  Imaging: US  Abdomen Limited RUQ (LIVER/GB) Result Date:  07/02/2023 CLINICAL DATA:  151471 RUQ pain 151471 EXAM: ULTRASOUND ABDOMEN LIMITED RIGHT UPPER QUADRANT COMPARISON:  CT AP, 07/02/2023. FINDINGS: Gallbladder: Minimally distended gallbladder. No gallstones or wall thickening visualized. No sonographic Murphy sign noted by sonographer. Common bile duct: Diameter: 0.4 cm Liver: No focal lesion identified. Increased hepatic parenchymal echogenicity. Portal vein is patent on color Doppler imaging with normal direction of blood flow towards the liver. Other: No perihepatic ascites. IMPRESSION: 1. No acute sonographic findings within the RIGHT upper quadrant. 2. Echogenic liver. Findings most commonly seen in hepatic steatosis, though may also represent hepatitis and/or fibrosis. Electronically Signed   By: Thom Hall M.D.   On: 07/02/2023 16:31   DG CHEST PORT 1 VIEW Result Date: 07/02/2023 CLINICAL DATA:  Congestive heart failure. EXAM: PORTABLE CHEST 1 VIEW COMPARISON:  AP chest 07/01/2023 FINDINGS: Right internal jugular central venous catheter sheath tip again overlies the superior vena cava. Interval removal of the prior indwelling pulmonary arterial catheter. Otherwise, no significant change in additional support apparatus including left internal jugular dual-lumen central venous catheter tip overlying the superior vena cava/right atrial junction, enteric tube descending below the diaphragm with the tip excluded by collimation, and Impella device in appropriate position. Cardiac silhouette is again mildly enlarged. Mediastinal contours are within limits. Mildly decreased lung volumes. Lungs are clear. No pleural effusion or pneumothorax. No acute skeletal abnormality. IMPRESSION: 1. Interval  removal of the prior indwelling pulmonary arterial catheter. Otherwise, no significant change in additional support apparatus. 2. Mildly decreased lung volumes. No acute lung process. Electronically Signed   By: Tanda Lyons M.D.   On: 07/02/2023 11:58   CT ABDOMEN PELVIS  WO CONTRAST Result Date: 07/02/2023 CLINICAL DATA:  Acute, nonlocalized abdominal pain. EXAM: CT ABDOMEN AND PELVIS WITHOUT CONTRAST TECHNIQUE: Multidetector CT imaging of the abdomen and pelvis was performed following the standard protocol without IV contrast. RADIATION DOSE REDUCTION: This exam was performed according to the departmental dose-optimization program which includes automated exposure control, adjustment of the mA and/or kV according to patient size and/or use of iterative reconstruction technique. COMPARISON:  KUB 06/28/2023, AP chest 06/28/2023 and 07/02/2023, CT abdomen and pelvis 01/31/2023 FINDINGS: Lower chest: Partial visualization of Impella heart pump terminating within the left ventricle and with associated catheter within the visualized descending aorta, right common and external iliac arteries common extending towards the anterior right groin soft tissues. Weighted tip enteric tube again curls within the stomach with the tip overlying the posterior aspect of the gastric body. Lack of intravenous fluid limits evaluation of the solid abdominal and pelvic organ parenchyma. The following findings are made within this limitation. Hepatobiliary: Smooth liver contours. No gross focal liver lesion is seen. The gallbladder is again mildly distended but no definite inflammatory changes are noted. No intrahepatic biliary ductal dilatation. Pancreas: Unremarkable. Spleen: Unremarkable. Adrenals/Urinary Tract: Normal bilateral adrenals. No hydronephrosis. Punctate nonobstructing right lower pole and left lower pole renal stones unchanged.The urinary bladder is decompressed by indwelling Foley catheter. Stomach/Bowel: 1 mild-to-moderate sigmoid diverticulosis. Mild air-fluid levels within the distal transverse and the descending portions of the colon. Mild diverticula within the proximal transverse colon, similar to prior. No inflammatory changes to indicate acute diverticulitis. The terminal ileum is  unremarkable. No dilated loops of bowel are seen to indicate bowel obstruction. Normal appendix (coronal images a 50 through 72, axial images 53 through 61). Vascular/Lymphatic: Moderate to high-grade atherosclerotic calcifications. No abdominal aortic aneurysm. No mesenteric, retroperitoneal, or pelvic pathologically 1 enlarged lymph nodes by CT criteria. Reproductive: The prostate and seminal vesicles are grossly unremarkable. Other: No ventral abdominal wall hernia. No free air or free fluid is seen within the abdomen or pelvis. Musculoskeletal: There is partial lumbarization of S1. Mild-to-moderate posterior L5-S1 disc space narrowing and endplate osteophytes. IMPRESSION: 1. Mild air-fluid levels within the distal transverse and descending portions of the colon. This is nonspecific but can be seen with diarrheal illness. 2. Mild-to-moderate sigmoid diverticulosis. No inflammatory changes to indicate acute diverticulitis. 3. Unchanged punctate nonobstructing bilateral lower pole renal stones. No hydronephrosis. Electronically Signed   By: Tanda Lyons M.D.   On: 07/02/2023 11:56    Labs: BMET Recent Labs  Lab 06/30/23 0417 06/30/23 1616 07/01/23 0435 07/01/23 1729 07/01/23 2334 07/02/23 0455 07/02/23 1328 07/02/23 1507 07/03/23 0403  NA 133*  132* 133* 132* 132* 131* 131* 129* 130* 132*  K 4.2  4.1 4.3 4.5 4.7 4.4 4.7 4.9 4.5 4.4  CL 99  100 99 99 98 98 99  --  98 97*  CO2 26  25 26 27 25 22 25   --  24 25  GLUCOSE 236*  233* 269* 293* 236* 308* 249*  --  293* 230*  BUN 26*  24* 22* 20 22* 25* 27*  --  26* 28*  CREATININE 2.57*  2.47* 2.37* 2.28* 2.27* 2.38* 2.49*  --  2.57* 2.49*  CALCIUM  7.8*  7.8* 8.0* 8.0* 8.2*  8.1* 7.9*  --  7.8* 8.0*  PHOS 2.1*  2.2* 2.1*  2.1* 2.0*  2.0* 2.0*  2.1*  --  2.2*  --  2.4* 2.3*   CBC Recent Labs  Lab 07/02/23 0455 07/02/23 0938 07/02/23 1328 07/02/23 2208 07/03/23 0403  WBC 18.4* 20.3*  --  17.7* 20.2*  HGB 7.1* 7.2* 6.8* 6.9* 6.8*   HCT 21.6* 21.4* 20.0* 20.6* 20.5*  MCV 87.1 86.6  --  86.6 88.0  PLT 101* 100*  --  78* 72*    Medications:     aspirin  EC  81 mg Oral Daily   atorvastatin   80 mg Oral Daily   Chlorhexidine  Gluconate Cloth  6 each Topical Daily   famotidine   20 mg Oral Daily   feeding supplement (PROSource TF20)  60 mL Per Tube TID   insulin  aspart  0-20 Units Subcutaneous Q4H   insulin  aspart  10 Units Subcutaneous Q4H   insulin  glargine-yfgn  35 Units Subcutaneous Daily   multivitamin  1 tablet Oral BID   mouth rinse  15 mL Mouth Rinse 4 times per day   pantoprazole   40 mg Oral Daily   senna-docusate  1 tablet Oral BID   sodium chloride  flush  3 mL Intravenous Q12H    Almarie Bonine, MD 07/03/2023, 11:28 AM

## 2023-07-03 NOTE — Progress Notes (Addendum)
 Patient ID: Gabriel Shaw, male   DOB: [birth year], 53 y.o.   MRN: [id number]     Advanced Heart Failure Rounding Note  Cardiologist: None   Chief Complaint: Cardiogenic shock  Subjective:    Remains on milrinone  0.375, NE 7 this morning.  Co-ox 67%.  CVP 11, I/Os even via CVVH over the last day.   Abdominal pain, concern for infection.  CT abdomen/pelvis and abdominal US  1/3, no definite gallbladder pathology and LFTs normal.  CT showed mild air-fluid levels within distal transverse and descending portions of colon (nonspecific, can bee seen with diarrheal diseases).  He is now on Zosyn  and doxycycline , WBCs 18 => 20K.  Afebrile.   Impella CP: P8 Flow 3.3 L/min No alarms LDH 629 => 644  Hgb 6.8 (slow decrease) Plts 101 => 72.  HIT negative on 12/31.  Filter clotting with CVVH.   Amiodarone  at 60 mg/hr due to runs of NSVT.  Having episodes of chest pain/nausea when lying back.  Still with some abdominal pain and also having watery stools.    Objective:   Weight Range: 93.7 kg Body mass index is 25.82 kg/m.   Vital Signs:   Temp:  [97.3 F (36.3 C)-99.4 F (37.4 C)] 98.9 F (37.2 C) (01/04 0800) Pulse Rate:  [66-90] 78 (01/04 0700) Resp:  [9-27] 13 (01/04 0700) BP: (72-119)/(52-94) 100/57 (01/04 0700) SpO2:  [70 %-100 %] 100 % (01/04 0700) Arterial Line BP: (55-129)/(41-74) 96/55 (01/04 0700) FiO2 (%):  [28 %] 28 % (01/03 2011) Weight:  [93.7 kg] 93.7 kg (01/04 0500) Last BM Date : 07/03/23  Weight change: Filed Weights   07/01/23 0343 07/02/23 0256 07/03/23 0500  Weight: 99.8 kg 98.6 kg 93.7 kg    Intake/Output:   Intake/Output Summary (Last 24 hours) at 07/03/2023 0845 Last data filed at 07/03/2023 0800 Gross per 24 hour  Intake 4420.95 ml  Output 4546 ml  Net -125.05 ml      Physical Exam   General: Nausea currently.  Neck: JVP 10 cm, no thyromegaly or thyroid  nodule.  Lungs: Decreased at bases.  CV: Nondisplaced PMI.  Heart regular S1/S2, no S3/S4,  no murmur.  No peripheral edema.    Abdomen: Soft, mildly tender, no hepatosplenomegaly, no distention.  Skin: Intact without lesions or rashes.  Neurologic: Alert and oriented x 3.  Psych: Normal affect. Extremities: No clubbing or cyanosis.  HEENT: Normal.   Telemetry   NSR 80s, personally reviewed.   Labs    CBC Recent Labs    07/02/23 2208 07/03/23 0403  WBC 17.7* 20.2*  HGB 6.9* 6.8*  HCT 20.6* 20.5*  MCV 86.6 88.0  PLT 78* 72*   Basic Metabolic Panel Recent Labs    98/97/74 1729 07/01/23 2334 07/02/23 0000 07/02/23 0455 07/02/23 1507 07/03/23 0403  NA 132*   < >  --    < > 130* 132*  K 4.7   < >  --    < > 4.5 4.4  CL 98   < >  --    < > 98 97*  CO2 25   < >  --    < > 24 25  GLUCOSE 236*   < >  --    < > 293* 230*  BUN 22*   < >  --    < > 26* 28*  CREATININE 2.27*   < >  --    < > 2.57* 2.49*  CALCIUM  8.2*   < >  --    < >  7.8* 8.0*  MG 2.6*  --  3.2*  --   --   --   PHOS 2.0*  2.1*  --   --    < > 2.4* 2.3*   < > = values in this interval not displayed.   Liver Function Tests Recent Labs    07/01/23 2334 07/02/23 0455 07/02/23 0856 07/02/23 1507 07/03/23 0403  AST 30  --  34  --   --   ALT 23  --  25  --   --   ALKPHOS 53  --  53  --   --   BILITOT 0.5  --  0.6  --   --   PROT 6.1*  --  6.1*  --   --   ALBUMIN  2.6*   < > 2.6* 2.5* 3.1*   < > = values in this interval not displayed.   No results for input(s): LIPASE, AMYLASE in the last 72 hours. Cardiac Enzymes No results for input(s): CKTOTAL, CKMB, CKMBINDEX, TROPONINI in the last 72 hours.  BNP: BNP (last 3 results) No results for input(s): BNP in the last 8760 hours.  ProBNP (last 3 results) No results for input(s): PROBNP in the last 8760 hours.   D-Dimer No results for input(s): DDIMER in the last 72 hours. Hemoglobin A1C No results for input(s): HGBA1C in the last 72 hours. Fasting Lipid Panel No results for input(s): CHOL, HDL, LDLCALC, TRIG,  CHOLHDL, LDLDIRECT in the last 72 hours.  Thyroid  Function Tests No results for input(s): TSH, T4TOTAL, T3FREE, THYROIDAB in the last 72 hours.  Invalid input(s): FREET3  Other results:   Imaging    US  Abdomen Limited RUQ (LIVER/GB) Result Date: 07/02/2023 CLINICAL DATA:  151471 RUQ pain 151471 EXAM: ULTRASOUND ABDOMEN LIMITED RIGHT UPPER QUADRANT COMPARISON:  CT AP, 07/02/2023. FINDINGS: Gallbladder: Minimally distended gallbladder. No gallstones or wall thickening visualized. No sonographic Murphy sign noted by sonographer. Common bile duct: Diameter: 0.4 cm Liver: No focal lesion identified. Increased hepatic parenchymal echogenicity. Portal vein is patent on color Doppler imaging with normal direction of blood flow towards the liver. Other: No perihepatic ascites. IMPRESSION: 1. No acute sonographic findings within the RIGHT upper quadrant. 2. Echogenic liver. Findings most commonly seen in hepatic steatosis, though may also represent hepatitis and/or fibrosis. Electronically Signed   By: Thom Hall M.D.   On: 07/02/2023 16:31   CT ABDOMEN PELVIS WO CONTRAST Result Date: 07/02/2023 CLINICAL DATA:  Acute, nonlocalized abdominal pain. EXAM: CT ABDOMEN AND PELVIS WITHOUT CONTRAST TECHNIQUE: Multidetector CT imaging of the abdomen and pelvis was performed following the standard protocol without IV contrast. RADIATION DOSE REDUCTION: This exam was performed according to the departmental dose-optimization program which includes automated exposure control, adjustment of the mA and/or kV according to patient size and/or use of iterative reconstruction technique. COMPARISON:  KUB 06/28/2023, AP chest 06/28/2023 and 07/02/2023, CT abdomen and pelvis 01/31/2023 FINDINGS: Lower chest: Partial visualization of Impella heart pump terminating within the left ventricle and with associated catheter within the visualized descending aorta, right common and external iliac arteries common extending  towards the anterior right groin soft tissues. Weighted tip enteric tube again curls within the stomach with the tip overlying the posterior aspect of the gastric body. Lack of intravenous fluid limits evaluation of the solid abdominal and pelvic organ parenchyma. The following findings are made within this limitation. Hepatobiliary: Smooth liver contours. No gross focal liver lesion is seen. The gallbladder is again mildly distended but no definite inflammatory  changes are noted. No intrahepatic biliary ductal dilatation. Pancreas: Unremarkable. Spleen: Unremarkable. Adrenals/Urinary Tract: Normal bilateral adrenals. No hydronephrosis. Punctate nonobstructing right lower pole and left lower pole renal stones unchanged.The urinary bladder is decompressed by indwelling Foley catheter. Stomach/Bowel: 1 mild-to-moderate sigmoid diverticulosis. Mild air-fluid levels within the distal transverse and the descending portions of the colon. Mild diverticula within the proximal transverse colon, similar to prior. No inflammatory changes to indicate acute diverticulitis. The terminal ileum is unremarkable. No dilated loops of bowel are seen to indicate bowel obstruction. Normal appendix (coronal images a 50 through 72, axial images 53 through 61). Vascular/Lymphatic: Moderate to high-grade atherosclerotic calcifications. No abdominal aortic aneurysm. No mesenteric, retroperitoneal, or pelvic pathologically 1 enlarged lymph nodes by CT criteria. Reproductive: The prostate and seminal vesicles are grossly unremarkable. Other: No ventral abdominal wall hernia. No free air or free fluid is seen within the abdomen or pelvis. Musculoskeletal: There is partial lumbarization of S1. Mild-to-moderate posterior L5-S1 disc space narrowing and endplate osteophytes. IMPRESSION: 1. Mild air-fluid levels within the distal transverse and descending portions of the colon. This is nonspecific but can be seen with diarrheal illness. 2.  Mild-to-moderate sigmoid diverticulosis. No inflammatory changes to indicate acute diverticulitis. 3. Unchanged punctate nonobstructing bilateral lower pole renal stones. No hydronephrosis. Electronically Signed   By: Tanda Lyons M.D.   On: 07/02/2023 11:56      Medications:   Scheduled Medications:  aspirin  EC  81 mg Oral Daily   atorvastatin   80 mg Oral Daily   Chlorhexidine  Gluconate Cloth  6 each Topical Daily   famotidine   20 mg Oral Daily   feeding supplement (PROSource TF20)  60 mL Per Tube TID   insulin  aspart  0-20 Units Subcutaneous Q4H   insulin  aspart  10 Units Subcutaneous Q4H   insulin  glargine-yfgn  35 Units Subcutaneous Daily   multivitamin  1 tablet Oral BID   mouth rinse  15 mL Mouth Rinse 4 times per day   pantoprazole   40 mg Oral Daily   senna-docusate  1 tablet Oral BID   sodium chloride  flush  3 mL Intravenous Q12H    Infusions:   prismasol  BGK 4/2.5 500 mL/hr at 07/03/23 0000    prismasol  BGK 4/2.5 400 mL/hr at 07/03/23 0247   amiodarone  60 mg/hr (07/03/23 0800)   bivalirudin  (ANGIOMAX ) 250 mg in sodium chloride  0.9 % 500 mL (0.5 mg/mL) infusion     doxycycline  (VIBRAMYCIN ) IV Stopped (07/03/23 0200)   feeding supplement (VITAL 1.5 CAL) 65 mL/hr at 07/03/23 0800   milrinone  0.375 mcg/kg/min (07/03/23 0800)   norepinephrine  (LEVOPHED ) Adult infusion 7 mcg/min (07/03/23 0800)   piperacillin -tazobactam (ZOSYN )  IV 12.5 mL/hr at 07/03/23 0800   prismasol  BGK 4/2.5 2,000 mL/hr at 07/03/23 0739   promethazine  (PHENERGAN ) injection (IM or IVPB) 12.5 mg (06/27/23 2030)   sodium bicarbonate  25 mEq (Impella PURGE) in dextrose  5 % 1000 mL bag 9.3 mL/hr at 07/02/23 1347    PRN Medications: acetaminophen , alum & mag hydroxide-simeth, chlorproMAZINE  (THORAZINE ) injection, haloperidol  lactate, heparin , HYDROmorphone  (DILAUDID ) injection, ondansetron  (ZOFRAN ) IV, mouth rinse, mouth rinse, mouth rinse, polyethylene glycol, promethazine  (PHENERGAN ) injection (IM or IVPB),  sodium chloride , sodium chloride  flush  Assessment/Plan   1. CAD: Late presenting anterior/inferior STEMI/CAD.  70% mid LAD, 100% m-dLAD, 90% pRCA, 90% mRCA, 70% LPAV.  Disease not amenable to PCI, failed attempt to cross LAD occlusion x 3.  Medical management.  He has had episodes of chest pain/nausea when lying back, relieved with lies on side.  HS-TnI 1/3  showed down-trend from initial MI.  Suspect this may be GERD-related and not ischemic pain.  - ASA + statin   - no ? blocker or Imdur with shock.  - For current pain, ?GERD => Protonix , will try GI cocktail.  2. Acute Systolic Heart Failure/Cardiogenic shock: Ischemic cardiomyopathy, limited echo 12/29 with EF 25% with LAD territory WMAs, LV thrombus, RV normal. Worry that he has nonviable LAD-territory myocardium. Initial RHC with mean RA 6, mean PCWP 20, CI 1.93.  Had been weaning Impella until 1/3, then became lethargic with watery stool and abdominal pain, BP dropped (?septic shock/distributive shock component) and NE restarted with Impella back to P8.  Impella CP at P8 with flow 3.3 L/min. Also on milrinone  0.375, NE 7. CO-OX 67%, CVP 11 today.  Have been running CVVH even with low BP.  - Keep Impella at P8 today, check position by echo => Impella inflow at 4.0 cm, position looks stable and was not adjusted.  - Try to run CVVH slighly negative (up to 50 cc/hr net negative UF if tolerated).  - On heparin  gtt with Impella and LV thrombus => with platelets continuing to fall and clotting of CVVH filter repetitively on heparin , will change to bivalirudin .  - Titrate NE as able to keep MAP > 65.  - Broad spectrum abx to cover sepsis (see below).  - OK for albumin  if needed.  - Our options were originally quite limited, we were trying to wean off Impella CP, and if not able to wean off, would consider Impella 5.5 to give him more time if we could document resolution of apical thrombus.  However, now back to P8 on Impella CP and more pressor  support with possible septic shock component.  He is not a candidate for LVAD with AKI/CVVH + Je[employer] not accepting blood products.  I worry that his prognosis is getting increasingly poor.  We discussed code status today, he wants to remain full code, discussed with his mother and him today.  Will also make palliative care consult.   3. AKI: SCr baseline 1.3.  Suspect ATN in setting of cardiogenic shock.  Now on CVVH, awaiting renal recovery.   - Maintain CO and MAP.  - Will try to pull gently net negative as above.  4. Elevated LFTs: Now normal.  5. ID: Completed ceftriaxone  for possible aspiration. 1/3 started on Zosyn  + doxycycline  over concern for septic shock.  Possible abdominal source given symptoms. CT abdomen/pelvis and abdominal US  1/3, no definite gallbladder pathology and LFTs normal.  CT showed mild air-fluid levels within distal transverse and descending portions of colon (nonspecific, can bee seen with diarrheal diseases).  Still with loose stool and some abdominal discomfort. WBCs 20K today.  - Continue Zosyn /doxycycline .  6. Type 2DM: SSI. 7. Anemia: Blood loss at Impella site initially, seems to have stopped.  Hgb lower today at 6.8. Je[employer] Witness so no blood products.  - he would NOT accept any blood products even in a life and death situation except for albumin .  - Has had IV Fe and Aranesp .  8. LV thrombus: On heparin  gtt. Still noted on 1/4 echo.  With ongoing drop in platelets and repetitive clotting of CVVH filter, changing to bivalirudin .  9. Atrial fibrillation: Noted on 12/29. Converted with amiodarone  gtt. Continue amiodarone  at 60/hr. - Anticoagulation.  10. FEN: Tube feeds via [employer] ongoing due to poor appetite/nausea.   11. Thrombocytopenia: May be due to Impella.  HIT negative on  12/31 but platelets now falling again, 101 => 72.  - Check Impella position.  - Discussed with pharmacy, will transition to bivalirudin .  12. Diabetic foot ulcer: Right  foot.  Follows with ortho and podiatry in outpatient setting. - WOC has seen 13. NSVT/PVCs: More frequent, now on amiodarone  gtt at 60 mg/hr.   CRITICAL CARE Performed by: Ezra Shuck  Total critical care time: 50 minutes  Critical care time was exclusive of separately billable procedures and treating other patients.  Critical care was necessary to treat or prevent imminent or life-threatening deterioration.  Critical care was time spent personally by me on the following activities: development of treatment plan with patient and/or surrogate as well as nursing, discussions with consultants, evaluation of patient's response to treatment, examination of patient, obtaining history from patient or surrogate, ordering and performing treatments and interventions, ordering and review of laboratory studies, ordering and review of radiographic studies, pulse oximetry and re-evaluation of patient's condition.  Ezra Shuck 07/03/2023 .nwo

## 2023-07-03 NOTE — Progress Notes (Signed)
  Echocardiogram 2D Echocardiogram has been performed.  Ocie Doyne RDCS 07/03/2023, 9:01 AM

## 2023-07-03 NOTE — Consult Note (Signed)
 Palliative Care Consult Note                                  Date: 07/03/2023   Patient Name: Gabriel Shaw  DOB: 11-13-1970  MRN: [id number]  Age / Sex: 53 y.o., male  PCP: Gabriel Hadassah SQUIBB, MD Referring Physician: Cherrie Toribio SAUNDERS, MD  Reason for Consultation: Establishing goals of care  HPI/Patient Profile: 53 y.o. male  with past medical history of diabetes type 2, diabetic foot wound, and is a Jehovah's Witness who presented to [facility] ED on 06/25/2023 with 2 days of chest pain that had suddenly worsened the morning of presentation.  He was admitted with late STEMI and underwent left heart cath demonstrating multivessel disease which was unable to be intervened upon due to severe stenosis and inability to cross with a wire.  During left heart cath he had an Impella placed and was started on norepinephrine .  He was transferred to [facility] and has remained on heparin , Impella, bicarb, and milrinone .  He has been on CRRT since 12/31.  Palliative Medicine was consulted for goals of care discussions.  Subjective:   I have reviewed medical records including EPIC notes, labs and imaging. Updates received from the ICU care team.  Apparently patient had been improving with decreasing norepinephrine  requirements and yesterday 1/3 was feeling well. Today, he is more lethargic and complaining of abdominal pain.  I met with patient  at bedside to discuss diagnosis, prognosis, and GOC.   I introduced Palliative Medicine as an extra layer of support for patients dealing with serious illness.  We discussed patient's current illness and what it means in the larger context of his ongoing co-morbidities. Current clinical status was reviewed.  Created space and opportunity for patient to express thoughts and feelings regarding current medical situation. Values and goals of care were attempted to be elicited.  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 He lives with this mother, who is his main support person.   Today's Discussion: Patient is initially lethargic, but is ultimately able to stay awake to have a discussion. He reports feeling worse today.   He is having intermittent bradycardia and rhythm changes;  his RN is obtaining a 12-lead EKG.   Patient is able to verbalize that he had a heart attack and that his heart has been in an abnormal rhythm. We reviewed that he has an Impella, which is a device supporting his heart while allowing opportunity for recovery.   Patient verbalizes understanding that he is critically ill and that there are limited treatment options. He is understandably emotional and tearful, at one point stating I'm going to die today, aren't I ? I provided emotional support and reassurance that the ICU team was working very hard to prevent that from happening.     We discussed code status. Provided education on evidenced based poor outcomes of resuscitation efforts in similar hospitalized patients. Discussed that some patients would rather have a natural death , and opt not to go through resuscitation efforts in the event of cardiac arrest.   Initially patient seemed to be leaning toward DNR/DNI, stating that he would not have good quality of life if he survived a code event. He stated this isn't living now (referring to his current clinical status).   However after further consideration, he states do what it takes to  keep me alive and my mom and my dad can make decisions from there. He also mentions being concerned about his mother's mental health.   Provided reassurance that we will honor whatever his wishes are. Also discussed the importance of continued conversation with his family and the medical team regarding overall plan of care and treatment options, ensuring decisions are within his values and goals.      Review of Systems  Constitutional:  Positive  for fatigue.  Gastrointestinal:  Positive for abdominal pain.    Objective:   Primary Diagnoses: Present on Admission:  Cardiogenic shock [facility])   Physical Exam Vitals reviewed.  Constitutional:      General: He is in acute distress.     Comments: Critically ill-appearing  HENT:     Head:     Comments: Cortrak in place Cardiovascular:     Rate and Rhythm: Rhythm irregular.  Neurological:     Mental Status: He is oriented to person, place, and time and easily aroused. He is lethargic.      Palliative Assessment/Data: PPS 30%     Assessment & Plan:   SUMMARY OF RECOMMENDATIONS   Full code for now with ongoing discussion Continue full scope interventions PMT will continue to follow and support  Primary Decision Maker: PATIENT Next of kin - mother  Symptom Management:  Per attending  Prognosis:  Unable to determine  Discharge Planning:  To Be Determined    Thank you for allowing us  to participate in the care of Gabriel Shaw   Time Total: 75 minutes  Detailed review of medical records (labs, imaging, vital signs), medically appropriate exam, discussed with treatment team, counseling and education to patient, documenting clinical information, coordination of care.   Signed by: Recardo Loll, NP Palliative Medicine Team  Team Phone # [phone]  For individual providers, please see AMION

## 2023-07-03 NOTE — Progress Notes (Signed)
 PHARMACY - ANTICOAGULATION CONSULT NOTE  Pharmacy Consult for heparin  > switch to bivalirudin  Indication:  Impella CP/apical thrombus  Allergies  Allergen Reactions   Whole Blood Other (See Comments)    Jehovah's witness -  no blood products - patient okay with albumin     Patient Measurements: Height: 6' 3 (190.5 cm) Weight: 93.7 kg (206 lb 9.1 oz) IBW/kg (Calculated) : 84.5 Heparin  Dosing Weight: 107 kg  Vital Signs: Temp: 98.9 F (37.2 C) (01/04 1230) Temp Source: Oral (01/04 1230) BP: 126/78 (01/04 1600) Pulse Rate: 79 (01/04 1630)  Labs: Recent Labs    07/02/23 0455 07/02/23 0856 07/02/23 0938 07/02/23 1328 07/02/23 1507 07/02/23 1715 07/02/23 2208 07/03/23 0403 07/03/23 1253 07/03/23 1339 07/03/23 1617  HGB 7.1*  --  7.2* 6.8*  --   --  6.9* 6.8*  --   --   --   HCT 21.6*  --  21.4* 20.0*  --   --  20.6* 20.5*  --   --   --   PLT 101*  --  100*  --   --   --  78* 72*  --   --   --   APTT  --   --   --   --   --   --   --   --  57*  --  62*  HEPARINUNFRC 0.37  --   --   --   --  0.37  --  0.37  --   --   --   CREATININE 2.49*  --   --   --  2.57*  --   --  2.49*  --  2.43*  --   TROPONINIHS  --  3,220*  --   --  6,722*  --   --   --   --   --   --     Estimated Creatinine Clearance: 42.5 mL/min (A) (by C-G formula based on SCr of 2.43 mg/dL (H)).   Medical History: Past Medical History:  Diagnosis Date   Depression 1989   Diabetes mellitus without complication (HCC)    Gastroesophageal reflux disease 01/31/2014   Heart failure (HCC) 07/02/2023   HLD (hyperlipidemia) 03/12/2023   Hypertension    ST elevation myocardial infarction (STEMI) (HCC) 06/25/2023   Ulcer 2016     Assessment: 30 yoM admitted as late presenting STEMI s/p unsuccessful PCI and Impella CP placement. ECHO showing large apical thrombus. Pharmacy to manage heparin . Bicarbonate purge started. No AC PTA, HIT panel 1/1 negative.  Heparin  level stable and therapeutic at 0.37 ) on heparin   drip rate of 1800 u/hr, however CRRT circuit continues to clot despite therapeutic heparin  levels.  LDH continues to trend down. No new signs of bleeding, but Hgb continues to drift down 6.8.  Platelets also down to 72.  HIT negative 12/31.  Pharmacy asked to switch heparin  to bivalirudin  to avoid high heparin  drip rate and to see if more efficacious for CRRT circuit as well.  PTT came back therapeutic this PM at 62. No clotting issue per Rn.   Goal of Therapy:  APTT 50-85 Monitor platelets by anticoagulation protocol: Yes   Plan:  Start bivalirudin  at 0.05 mg/kg/hr. Check aPTT q12 hr aPTT and daily CBC.  Sergio Batch, PharmD, BCIDP, AAHIVP, CPP Infectious Disease Pharmacist 07/03/2023 5:04 PM

## 2023-07-03 NOTE — Progress Notes (Signed)
 NAME:  Gabriel Shaw, MRN:  [id number], DOB:  08-25-1970, LOS: 8 ADMISSION DATE:  06/25/2023, CONSULTATION DATE:  1/3 REFERRING MD:  Rolan, CHIEF COMPLAINT:  encephalopathy   History of Present Illness:  Gabriel Shaw is a 53 y/o gentleman with a history of Jehovah's Witness, DM, diabetic foot wound on R foot who presented with late STEMI on 12/27 with 2 days of chest pain that had suddenly worsened the morning of presentation.  At presentation he underwent left heart cath demonstrating multivessel disease which was unable to be intervened upon severe stenosis and inability to cross with a wire.  During left heart cath he had an Impella CP placed with started on norepinephrine .  He was transferred from [facility] to [facility] and has remained on heparin , Impella with bicarb purge.  He initially did complication groin hematoma which is improved.  He has not received blood products due to being a Jehovah's Witness.  He had been improving with decreasing norepinephrine  requirements; only requiring milrinone  and no longer requiring vasopressors.  And was feeling well yesterday.  Today he is lethargic and complaining of abdominal pain.  Abdominal pain is most notable in the right upper quadrant, but he endorses pain all over.  He has been continued on CRRT since 12/31.  Earlier this admission he completed 5 days of ceftriaxone  empirically, which was discontinued on 1/1.  Pertinent  Medical History  Diabetes Hypertension Hyperlipidemia GERD [place] foot wound  Significant Hospital Events: Including procedures, antibiotic start and stop dates in addition to other pertinent events   1227 admission, left heart cath unable to be revascularized 12/28 ceftriaxone  started, completed course on 1/1 1/2 swan removed 1/3 started doxy, zosyn   Interim History / Subjective:  Afebrile. Needing frequent CRRT filter changes.  Drops BP with all movement.  Had bedside echo today to check Impella positioning-- was  appropriate. Large liquid BM overnight.  No appetite, but thirsty. Having CP this morning-- reports he gets it every morning.   Objective   Blood pressure 110/72, pulse 81, temperature 98.9 F (37.2 C), temperature source Axillary, resp. rate 20, height 6' 3 (1.905 m), weight 93.7 kg, SpO2 100%. CVP:  [6 mmHg-17 mmHg] 10 mmHg  FiO2 (%):  [28 %] 28 %   Intake/Output Summary (Last 24 hours) at 07/03/2023 0934 Last data filed at 07/03/2023 0900 Gross per 24 hour  Intake 4436.07 ml  Output 4536 ml  Net -99.93 ml   Filed Weights   07/01/23 0343 07/02/23 0256 07/03/23 0500  Weight: 99.8 kg 98.6 kg 93.7 kg    Examination: General: ill appearing man lying in bed in NAD HENT: [place]/AT, eyes anicteric, cortrak. Lungs: mild tachypnea, no accessory muscle use. CTAB.  Cardiovascular:  S1S2, RRR Abdomen: less TTP, hypoactive bowel sounds Extremities: no edema, no cyanosis. Impella RLE with normal distal perfusion. Neuro: Sleepy but arouses easily, able to answer questions.  Derm: warm, dry, no rashes.  Co. ox 67% Sodium 132 BUN 28, creatinine 2.49 on CRRT. Phos 2.3 LDH 644 WBC 20.2 H/H 6.8/20.5 Platelets 72 Echo- pending  Blood cultures:  NGTD  Trop x 2 yesterday in 6700s, down from presentation  Resolved Hospital Problem list     Assessment & Plan:  Acute encephalopathy, concerned that he has an acute infection or metabolic source related to his abdominal pain. Improved today.  -avoid deliriogenic meds -mother at bedside again this morning.   Severe abdominal pain with guarding-- worry about low level mesenteric hypoperfusion or translocation. No evidence of  biliary disease. Could have had infectious colitis with diarrhea, but ischemic seems more likely. One very large liquid BM yesterday.  -worry about low level mesenteric ischemia from limited cardiac output; coox has been reassurring.  -con't broad antibiotics to cover for gut translocation -dilaudid  PRN for pain  control  Jehovah's Witness - See separate documentation-discussed with him & his mother 1/3- he is okay with albumin  but not other blood products.    Acute blood loss anemia due to groin hematoma initially, slowly losing blood in CRRT circuits; likely low production due to acute illness.  - No blood transfusions due to Jehovah's Witness - limit blood draws where feasible. - EPO previously given this admission on 12/30; iron  administered 12/31 (2g)  Acute HFrEF due to ischemic cardiomyopathy due to late presenting STEMI without revascularization LV apical thrombus Afib, new onset - impella & milrinone  per AHF -switching heparin  to bivalirudin  -bicarb purge for impella -aspirin , statin -worry about ongoing CP, uncorrected CAD, ongoing anemia, failure of cardiac function to recover, and potentially a complication of low flow cardiac status.  -Holding GDMT for HFrEF until shock & AKI improve -Unable to upsized to Impella 5.5 until apical thrombus has resolved.  Not currently an LVAD candidate due to Jehovah's Witness status and renal failure. -Amiodarone   AKI, likely ATN due to cardiogenic shock -CRRT per nephrology -may need citrate in circuit if he continues clotting - renally dose meds, avoid nephrotoxic meds -strict I/O  Risk for malnutrition -cortrak & TF -can drink and eat as tolerated  Diabetic foot ulcer R foot, present on presentation, doesn't appear acutely infected.  -Appreciate wound care team -antibiotics  Diabetes- still uncontrolled - Increase Semglee  to 50 units daily - TF coverage 10 units q4h with hold parameters -SSI PRN -Goal BG 140-180  Hypophosphatemia -enteral repletion  Regurgitation earlier in admission, has h/o GERD but concern for gastroparesis per RD. -needs gastric emptying study when stable  Mother updated with patient at bedside during rounds. We discussed my concern for his ongoing CP and my concern he may be having pain related to  uncorrected CAD. Worry he has not had improvement in cardiac function and we don't have a lot of other options. If his heart fails to recover, I am not confident he will survive this illness.   Best Practice (right click and Reselect all SmartList Selections daily)   Diet/type: tubefeeds DVT prophylaxis other- bival Pressure ulcer(s): present on admission - diabetic foot ulcer GI prophylaxis: PPI Lines: Central line, Dialysis Catheter, and Arterial Line Foley:  Yes, and it is still needed Code Status:  full code Last date of multidisciplinary goals of care discussion [1/4-with patient and mother ]  Labs   CBC: Recent Labs  Lab 07/01/23 0435 07/02/23 0455 07/02/23 0938 07/02/23 1328 07/02/23 2208 07/03/23 0403  WBC 15.7* 18.4* 20.3*  --  17.7* 20.2*  HGB 7.4* 7.1* 7.2* 6.8* 6.9* 6.8*  HCT 21.7* 21.6* 21.4* 20.0* 20.6* 20.5*  MCV 86.1 87.1 86.6  --  86.6 88.0  PLT 120* 101* 100*  --  78* 72*    Basic Metabolic Panel: Recent Labs  Lab 06/30/23 0417 06/30/23 1616 07/01/23 0435 07/01/23 1729 07/01/23 2334 07/02/23 0000 07/02/23 0455 07/02/23 1328 07/02/23 1507 07/03/23 0403  NA 133*  132* 133* 132* 132* 131*  --  131* 129* 130* 132*  K 4.2  4.1 4.3 4.5 4.7 4.4  --  4.7 4.9 4.5 4.4  CL 99  100 99 99 98 98  --  99  --  98 97*  CO2 26  25 26 27 25 22   --  25  --  24 25  GLUCOSE 236*  233* 269* 293* 236* 308*  --  249*  --  293* 230*  BUN 26*  24* 22* 20 22* 25*  --  27*  --  26* 28*  CREATININE 2.57*  2.47* 2.37* 2.28* 2.27* 2.38*  --  2.49*  --  2.57* 2.49*  CALCIUM  7.8*  7.8* 8.0* 8.0* 8.2* 8.1*  --  7.9*  --  7.8* 8.0*  MG 2.4 2.5* 2.4 2.6*  --  3.2*  --   --   --   --   PHOS 2.1*  2.2* 2.1*  2.1* 2.0*  2.0* 2.0*  2.1*  --   --  2.2*  --  2.4* 2.3*   GFR: Estimated Creatinine Clearance: 41.5 mL/min (A) (by C-G formula based on SCr of 2.49 mg/dL (H)). Recent Labs  Lab 07/02/23 0455 07/02/23 0911 07/02/23 0938 07/02/23 1334 07/02/23 2208  07/03/23 0403  PROCALCITON  --   --  0.34  --   --   --   WBC 18.4*  --  20.3*  --  17.7* 20.2*  LATICACIDVEN  --  2.2*  --  1.3  --   --     Liver Function Tests: Recent Labs  Lab 06/28/23 0408 06/28/23 1553 06/29/23 0505 06/29/23 1617 06/30/23 0417 06/30/23 1616 07/01/23 2334 07/02/23 0455 07/02/23 0856 07/02/23 1507 07/03/23 0403  AST 52*  --  39  --  37  --  30  --  34  --   --   ALT 28  --  24  --  24  --  23  --  25  --   --   ALKPHOS 44  --  43  --  48  --  53  --  53  --   --   BILITOT 0.6  --  0.7  --  0.7  --  0.5  --  0.6  --   --   PROT 5.8*  --  5.5*  --  5.8*  --  6.1*  --  6.1*  --   --   ALBUMIN  2.8*   < > 2.6*   < > 2.5*  2.5*   < > 2.6* 2.7* 2.6* 2.5* 3.1*   < > = values in this interval not displayed.   No results for input(s): LIPASE, AMYLASE in the last 168 hours. Recent Labs  Lab 07/02/23 1215 07/03/23 0403  AMMONIA 23 15    ABG    Component Value Date/Time   PHART 7.488 (H) 07/02/2023 1328   PCO2ART 33.8 07/02/2023 1328   PO2ART 123 (H) 07/02/2023 1328   HCO3 25.6 07/02/2023 1328   TCO2 27 07/02/2023 1328   ACIDBASEDEF 7.0 (H) 06/25/2023 1023   ACIDBASEDEF 9.0 (H) 06/25/2023 1023   O2SAT 66.8 07/03/2023 0431       Critical care time:  36 min.     Leita SHAUNNA Gaskins, DO 07/03/23 11:45 AM [facility]  For contact information, see Amion. If no response to pager, please call PCCM consult pager. After hours, 7PM- 7AM, please call Elink.

## 2023-07-03 NOTE — Progress Notes (Signed)
 PHARMACY - ANTICOAGULATION CONSULT NOTE  Pharmacy Consult for heparin  > switch to bivalirudin  Indication:  Impella CP/apical thrombus  Allergies  Allergen Reactions   Whole Blood Other (See Comments)    Jehovah's witness -  no blood products - patient okay with albumin     Patient Measurements: Height: 6' 3 (190.5 cm) Weight: 93.7 kg (206 lb 9.1 oz) IBW/kg (Calculated) : 84.5 Heparin  Dosing Weight: 107 kg  Vital Signs: Temp: 98.9 F (37.2 C) (01/04 0800) Temp Source: Axillary (01/04 0800) BP: 100/57 (01/04 0700) Pulse Rate: 78 (01/04 0700)  Labs: Recent Labs    07/02/23 0455 07/02/23 0856 07/02/23 0938 07/02/23 1328 07/02/23 1507 07/02/23 1715 07/02/23 2208 07/03/23 0403  HGB 7.1*  --  7.2* 6.8*  --   --  6.9* 6.8*  HCT 21.6*  --  21.4* 20.0*  --   --  20.6* 20.5*  PLT 101*  --  100*  --   --   --  78* 72*  HEPARINUNFRC 0.37  --   --   --   --  0.37  --  0.37  CREATININE 2.49*  --   --   --  2.57*  --   --  2.49*  TROPONINIHS  --  6,779*  --   --  6,722*  --   --   --     Estimated Creatinine Clearance: 41.5 mL/min (A) (by C-G formula based on SCr of 2.49 mg/dL (H)).   Medical History: Past Medical History:  Diagnosis Date   Depression 1989   Diabetes mellitus without complication (HCC)    Gastroesophageal reflux disease 01/31/2014   Heart failure (HCC) 07/02/2023   HLD (hyperlipidemia) 03/12/2023   Hypertension    ST elevation myocardial infarction (STEMI) (HCC) 06/25/2023   Ulcer 2016     Assessment: 57 yoM admitted as late presenting STEMI s/p unsuccessful PCI and Impella CP placement. ECHO showing large apical thrombus. Pharmacy to manage heparin . Bicarbonate purge started. No AC PTA, HIT panel 1/1 negative.  Heparin  level stable and therapeutic at 0.37 ) on heparin  drip rate of 1800 u/hr, however CRRT circuit continues to clot despite therapeutic heparin  levels.  LDH continues to trend down. No new signs of bleeding, but Hgb continues to drift down 6.8.   Platelets also down to 72.  HIT negative 12/31.  Pharmacy asked to switch heparin  to bivalirudin  to avoid high heparin  drip rate and to see if more efficacious for CRRT circuit as well.  Goal of Therapy:  APTT 50-85 Monitor platelets by anticoagulation protocol: Yes   Plan:  Stop IV heparin  Start bivalirudin  at 0.05 mg/kg/hr. Check aPTT 4 hrs after gtt starts Then q12 hr aPTT and daily CBC.  [name], [name], BCCP Clinical Pharmacist  07/03/2023 8:39 AM   [facility] pharmacy phone numbers are listed on [email or web address]

## 2023-07-03 NOTE — Progress Notes (Signed)
 PHARMACY - ANTICOAGULATION CONSULT NOTE  Pharmacy Consult for heparin  > switch to bivalirudin  Indication:  Impella CP/apical thrombus  Allergies  Allergen Reactions   Whole Blood Other (See Comments)    Jehovah's witness -  no blood products - patient okay with albumin     Patient Measurements: Height: 6' 3 (190.5 cm) Weight: 93.7 kg (206 lb 9.1 oz) IBW/kg (Calculated) : 84.5 Heparin  Dosing Weight: 107 kg  Vital Signs: Temp: 98.9 F (37.2 C) (01/04 1230) Temp Source: Oral (01/04 1230) BP: 111/89 (01/04 1400) Pulse Rate: 81 (01/04 1445)  Labs: Recent Labs    07/02/23 0455 07/02/23 0856 07/02/23 0938 07/02/23 1328 07/02/23 1507 07/02/23 1715 07/02/23 2208 07/03/23 0403 07/03/23 1253 07/03/23 1339  HGB 7.1*  --  7.2* 6.8*  --   --  6.9* 6.8*  --   --   HCT 21.6*  --  21.4* 20.0*  --   --  20.6* 20.5*  --   --   PLT 101*  --  100*  --   --   --  78* 72*  --   --   APTT  --   --   --   --   --   --   --   --  57*  --   HEPARINUNFRC 0.37  --   --   --   --  0.37  --  0.37  --   --   CREATININE 2.49*  --   --   --  2.57*  --   --  2.49*  --  2.43*  TROPONINIHS  --  3,220*  --   --  6,722*  --   --   --   --   --     Estimated Creatinine Clearance: 42.5 mL/min (A) (by C-G formula based on SCr of 2.43 mg/dL (H)).   Medical History: Past Medical History:  Diagnosis Date   Depression 1989   Diabetes mellitus without complication (HCC)    Gastroesophageal reflux disease 01/31/2014   Heart failure (HCC) 07/02/2023   HLD (hyperlipidemia) 03/12/2023   Hypertension    ST elevation myocardial infarction (STEMI) (HCC) 06/25/2023   Ulcer 2016     Assessment: Gabriel Shaw admitted as late presenting STEMI s/p unsuccessful PCI and Impella CP placement. ECHO showing large apical thrombus. Pharmacy to manage heparin . Bicarbonate purge started. No AC PTA, HIT panel 1/1 negative.  Pharmacy asked to switch heparin  to bivalirudin  to avoid high heparin  drip rate and to see if more  efficacious for CRRT circuit as well.  Aptt within goal range on bivalirudin  this afternoon (57 seconds).  No overt bleeding or complications noted.  Goal of Therapy:  APTT 50-85 Monitor platelets by anticoagulation protocol: Yes   Plan:  Continue bivalirudin  at 0.05 mg/kg/hr. Check aPTT q 12 hrs at 5 pm and 5 AM. Then q12 hr aPTT and daily CBC.  Gabriel Shaw, [name], BCCP Clinical Pharmacist  07/03/2023 3:41 PM   [facility] pharmacy phone numbers are listed on [email or web address]

## 2023-07-04 DIAGNOSIS — I469 Cardiac arrest, cause unspecified: Secondary | ICD-10-CM | POA: Diagnosis not present

## 2023-07-04 DIAGNOSIS — N179 Acute kidney failure, unspecified: Secondary | ICD-10-CM

## 2023-07-04 DIAGNOSIS — R739 Hyperglycemia, unspecified: Secondary | ICD-10-CM | POA: Diagnosis not present

## 2023-07-04 DIAGNOSIS — R570 Cardiogenic shock: Secondary | ICD-10-CM | POA: Diagnosis not present

## 2023-07-04 DIAGNOSIS — A419 Sepsis, unspecified organism: Secondary | ICD-10-CM | POA: Diagnosis not present

## 2023-07-04 DIAGNOSIS — I472 Ventricular tachycardia, unspecified: Secondary | ICD-10-CM

## 2023-07-04 LAB — RENAL FUNCTION PANEL
Albumin: 2.8 g/dL — ABNORMAL LOW (ref 3.5–5.0)
Anion gap: 8 (ref 5–15)
BUN: 27 mg/dL — ABNORMAL HIGH (ref 6–20)
CO2: 26 mmol/L (ref 22–32)
Calcium: 8 mg/dL — ABNORMAL LOW (ref 8.9–10.3)
Chloride: 99 mmol/L (ref 98–111)
Creatinine, Ser: 2.25 mg/dL — ABNORMAL HIGH (ref 0.61–1.24)
GFR, Estimated: 34 mL/min — ABNORMAL LOW (ref >60)
Glucose, Bld: 302 mg/dL — ABNORMAL HIGH (ref 70–99)
Phosphorus: 2.4 mg/dL — ABNORMAL LOW (ref 2.5–4.6)
Potassium: 4.5 mmol/L (ref 3.5–5.1)
Sodium: 133 mmol/L — ABNORMAL LOW (ref 135–145)

## 2023-07-04 LAB — COOXEMETRY PANEL
Carboxyhemoglobin: 1.5 % (ref 0.5–1.5)
Carboxyhemoglobin: 2.3 % — ABNORMAL HIGH (ref 0.5–1.5)
Methemoglobin: <0.7 % (ref 0.0–1.5)
Methemoglobin: <0.7 % (ref 0.0–1.5)
O2 Saturation: 64.8 %
O2 Saturation: 65.3 %
Total hemoglobin: 7 g/dL — ABNORMAL LOW (ref 12.0–16.0)
Total hemoglobin: 7 g/dL — ABNORMAL LOW (ref 12.0–16.0)

## 2023-07-04 LAB — CBC
HCT: 20.2 % — ABNORMAL LOW (ref 39.0–52.0)
Hemoglobin: 6.6 g/dL — CL (ref 13.0–17.0)
MCH: 29.2 pg (ref 26.0–34.0)
MCHC: 32.7 g/dL (ref 30.0–36.0)
MCV: 89.4 fL (ref 80.0–100.0)
Platelets: 62 10*3/uL — ABNORMAL LOW (ref 150–400)
RBC: 2.26 MIL/uL — ABNORMAL LOW (ref 4.22–5.81)
RDW: 14.9 % (ref 11.5–15.5)
WBC: 18.2 10*3/uL — ABNORMAL HIGH (ref 4.0–10.5)
nRBC: 5.5 % — ABNORMAL HIGH (ref 0.0–0.2)

## 2023-07-04 LAB — CALCIUM, IONIZED: Calcium, Ionized, Serum: 4.4 mg/dL — ABNORMAL LOW (ref 4.5–5.6)

## 2023-07-04 LAB — APTT: aPTT: 57 s — ABNORMAL HIGH (ref 24–36)

## 2023-07-04 LAB — LACTATE DEHYDROGENASE: LDH: 661 U/L — ABNORMAL HIGH (ref 98–192)

## 2023-07-04 LAB — MAGNESIUM: Magnesium: 2.5 mg/dL — ABNORMAL HIGH (ref 1.7–2.4)

## 2023-07-04 LAB — HEPARIN INDUCED PLATELET AB (HIT ANTIBODY): Heparin Induced Plt Ab: 2.047 {OD_unit} — ABNORMAL HIGH (ref 0.000–0.400)

## 2023-07-04 LAB — GLUCOSE, CAPILLARY
Glucose-Capillary: 245 mg/dL — ABNORMAL HIGH (ref 70–99)
Glucose-Capillary: 260 mg/dL — ABNORMAL HIGH (ref 70–99)
Glucose-Capillary: 266 mg/dL — ABNORMAL HIGH (ref 70–99)

## 2023-07-04 MED ORDER — INSULIN REGULAR(HUMAN) IN NACL 100-0.9 UT/100ML-% IV SOLN: INTRAVENOUS | Status: DC

## 2023-07-04 MED ORDER — LIDOCAINE HCL (CARDIAC) PF 100 MG/5ML IV SOSY
PREFILLED_SYRINGE | INTRAVENOUS | Status: AC
  Filled 2023-07-04: qty 5

## 2023-07-04 MED ORDER — ROCURONIUM BROMIDE 10 MG/ML (PF) SYRINGE
PREFILLED_SYRINGE | INTRAVENOUS | Status: AC
  Filled 2023-07-04: qty 10

## 2023-07-04 MED ORDER — MIDAZOLAM HCL 2 MG/2ML IJ SOLN
INTRAMUSCULAR | Status: AC
  Filled 2023-07-04: qty 2

## 2023-07-04 MED ORDER — SODIUM BICARBONATE 8.4 % IV SOLN
INTRAVENOUS | Status: AC | PRN
  Administered 2023-07-04: 50 meq via INTRAVENOUS

## 2023-07-04 MED ORDER — FENTANYL CITRATE PF 50 MCG/ML IJ SOSY
PREFILLED_SYRINGE | INTRAMUSCULAR | Status: AC
  Filled 2023-07-04: qty 2

## 2023-07-04 MED ORDER — EPINEPHRINE 1 MG/10ML IJ SOSY
PREFILLED_SYRINGE | INTRAMUSCULAR | Status: AC | PRN
  Administered 2023-07-04 (×2): 1 mg via INTRAVENOUS

## 2023-07-04 MED ORDER — EPINEPHRINE 0.1 MG/10ML (10 MCG/ML) SYRINGE FOR IV PUSH (FOR BLOOD PRESSURE SUPPORT)
PREFILLED_SYRINGE | INTRAVENOUS | Status: AC | PRN
  Administered 2023-07-04: 10 ug via INTRAVENOUS

## 2023-07-04 MED ORDER — ATROPINE SULFATE 1 MG/10ML IJ SOSY
PREFILLED_SYRINGE | INTRAMUSCULAR | Status: AC | PRN
  Administered 2023-07-04: 1 mg via INTRAVENOUS

## 2023-07-04 MED ORDER — ALBUMIN HUMAN 5 % IV SOLN
INTRAVENOUS | Status: AC
  Filled 2023-07-04: qty 1000

## 2023-07-04 MED ORDER — EPINEPHRINE 1 MG/10ML IJ SOSY
PREFILLED_SYRINGE | INTRAMUSCULAR | Status: AC | PRN
  Administered 2023-07-04 (×3): 1 mg via INTRAVENOUS

## 2023-07-04 MED ORDER — VASOPRESSIN 20 UNITS/100 ML INFUSION FOR SHOCK: 0.0000 [IU]/min | INTRAVENOUS | Status: DC

## 2023-07-04 MED ORDER — EPINEPHRINE HCL 5 MG/250ML IV SOLN IN NS
INTRAVENOUS | Status: AC
  Filled 2023-07-04: qty 250

## 2023-07-04 MED ORDER — LIDOCAINE IN D5W 4-5 MG/ML-% IV SOLN
0.5000 mg/min | INTRAVENOUS | Status: DC
  Administered 2023-07-04: 0.5 mg/min via INTRAVENOUS
  Filled 2023-07-04: qty 500

## 2023-07-04 MED ORDER — HYDROMORPHONE HCL 1 MG/ML IJ SOLN
0.5000 mg | Freq: Once | INTRAMUSCULAR | Status: AC
  Administered 2023-07-04: 0.5 mg via INTRAVENOUS
  Filled 2023-07-04: qty 0.5

## 2023-07-04 MED ORDER — ALBUMIN HUMAN 5 % IV SOLN
INTRAVENOUS | Status: AC
  Filled 2023-07-04: qty 250

## 2023-07-04 MED ORDER — DEXTROSE 50 % IV SOLN: 0.0000 mL | INTRAVENOUS | Status: DC | PRN

## 2023-07-04 MED ORDER — EPINEPHRINE 1 MG/10ML IJ SOSY
PREFILLED_SYRINGE | INTRAMUSCULAR | Status: AC
  Filled 2023-07-04: qty 40

## 2023-07-04 MED ORDER — CALCIUM CHLORIDE 10 % IV SOLN
INTRAVENOUS | Status: AC | PRN
  Administered 2023-07-04: 1 g via INTRAVENOUS

## 2023-07-04 MED ORDER — EPINEPHRINE 1 MG/10ML IJ SOSY
PREFILLED_SYRINGE | INTRAMUSCULAR | Status: AC | PRN
  Administered 2023-07-04: .5 mg via INTRAVENOUS
  Administered 2023-07-04: 1 mg via INTRAVENOUS

## 2023-07-06 LAB — SEROTONIN RELEASE ASSAY (SRA)
SRA .2 IU/mL UFH Ser-aCnc: 98 % — ABNORMAL HIGH (ref 0–20)
SRA 100IU/mL UFH Ser-aCnc: <1 % (ref 0–20)

## 2023-07-07 LAB — CULTURE, BLOOD (ROUTINE X 2)
Culture: NO GROWTH
Culture: NO GROWTH

## 2023-07-20 ENCOUNTER — Ambulatory Visit: Payer: Medicaid Other | Admitting: Pediatrics

## 2023-07-31 NOTE — Procedures (Signed)
 Intubation Procedure Note  Gabriel Shaw  [id number]  1971/04/11  Date:09-Jul-2023  Time:11:45 AM   Provider Performing:Paul Trettin [name]    Procedure: Intubation (31500)  Indication(s) Respiratory Failure  Consent Unable to obtain consent due to emergent nature of procedure.   Anesthesia none   Time Out Verified patient identification, verified procedure, site/side was marked, verified correct patient position, special equipment/implants available, medications/allergies/relevant history reviewed, required imaging and test results available.   Sterile Technique Usual hand hygeine, masks, and gloves were used   Procedure Description Patient positioned in bed supine.  Sedation given as noted above.  Patient was intubated with endotracheal tube using Glidescope.  View was Grade 1 full glottis .  Number of attempts was 1.  Colorimetric CO2 detector was consistent with tracheal placement.   Complications/Tolerance None; patient tolerated the procedure well. Chest X-ray is ordered to verify placement.   EBL 0   Specimen(s) None   Gabriel SHAUNNA Gretta, DO 09-Jul-2023 11:46 AM [facility]  For contact information, see Amion. If no response to pager, please call PCCM consult pager. After hours, 7PM- 7AM, please call Elink.

## 2023-07-31 NOTE — Progress Notes (Signed)
 RT called for rapid intubation.  RT arrived and assisted MD with intubation.  Pt receiving CPR and never placed on ventilator.  TOD called.  Rt discontinued ventilations at this time.

## 2023-07-31 NOTE — Progress Notes (Signed)
 [name] Progress Note   Assessment/ Plan:   Assessment/Plan: 53 year old male with hypertension, diabetes, and mild CKD.  He now presents late after MI and is suffering from cardiogenic shock complicated by acute renal failure 1.Renal-does have some mild CKD at baseline.  Also at least 1 episode in the past of AKI.  Urinalyses in the past have shown proteinuria-also for some reason had 1 done on 12/13 that showed some microscopic hematuria..  Will check urine here today for completeness sake but suspect that major etiology of his acute kidney injury is hemodynamic in nature in the setting of cardiogenic shock.  Has an Impella in place and is on norepinephrine  and milrinone  to reinforce blood pressure and kidney perfusion.   - did not respond to Lasix  challenge - started CRRT 06/28/23 - all 4K bath, frequent clotting - gentle net neg - no blood products, on iron  and ESA - increased ectopy, CP, and CRRT about to clot.  Will pause CRRT now to assess if improvement occurs with returning his blood.  I do not want to lose another system since his Hgb is 6.6.  2. Hypertension/volume  -UF as tolerated  3.  Cardiovascular-significant issue with multivessel CAD that was unable to be intervened upon.  - impella, milrinone , norepi - repeat TTE 07/03/23- EF 20-25%.    4. Anemia  -supportive care at this time  - start Aranesp   -IV iron   5.  R foot ulcer  6.  LV thrombus - hep--> bival  Subjective:    Lying in bed- having more ectopy and CP.  Bival has improved filter life to about 24 hrs but there are quite a few clots in the dialyzer and the chamber today.  We have a severely limited supply of citrate in the hospital and there is no word on when this shortage can be alleviated.     Objective:   BP 104/75 (BP Location: Left Arm)   Pulse 77   Temp 98.1 F (36.7 C) (Axillary)   Resp 13   Ht 6' 3 (1.905 m)   Wt 97.8 kg   SpO2 100%   BMI 26.95 kg/m   Intake/Output Summary  (Last 24 hours) at 07/07/2023 0850 Last data filed at 07-Jul-2023 0800 Gross per 24 hour  Intake 4935.94 ml  Output 4303 ml  Net 632.94 ml   Weight change: 4.1 kg  Physical Exam: Gen: lying in bed, appears ill CVS: RRR Resp clear anteriorly Abd: mildly distended Ext: 1+ LE edema ACCESS; L internal jugular nontunneled HD cath  Imaging: ECHOCARDIOGRAM LIMITED Result Date: 07/03/2023    ECHOCARDIOGRAM LIMITED REPORT   Patient Name:   Gabriel Shaw Date of Exam: 07/03/2023 Medical Rec #:  [id number]     Height:       75.0 in Accession #:    [id number]    Weight:       206.6 lb Date of Birth:  02-Oct-1970    BSA:          2.225 m Patient Age:    52 years      BP:           100/67 mmHg Patient Gender: M             HR:           85 bpm. Exam Location:  Inpatient Procedure: Limited Echo and Limited Color Doppler STAT ECHO Indications:    CHF  History:        Patient has  prior history of Echocardiogram examinations, most                 recent 06/30/2023. CHF, PAD; Risk Factors:Diabetes and                 Dyslipidemia.  Sonographer:    Sabrina Gentry Referring Phys: [id number] DALTON S MCLEAN IMPRESSIONS  1. LV apical thrombus remains present. The Impella inflow is located about 4 cm from aortic valve. It is in a stable position, I did not adjust. Left ventricular ejection fraction, by estimation, is 20 to 25%. The left ventricle has severely decreased function. The left ventricle demonstrates global hypokinesis. There is mild concentric left ventricular hypertrophy.  2. Right ventricular systolic function is mildly reduced. The right ventricular size is normal. FINDINGS  Left Ventricle: LV apical thrombus remains present. The Impella inflow is located about 4 cm from aortic valve. It is in a stable position, I did not adjust. Left ventricular ejection fraction, by estimation, is 20 to 25%. The left ventricle has severely decreased function. The left ventricle demonstrates global hypokinesis. The left ventricular internal  cavity size was normal in size. There is mild concentric left ventricular hypertrophy. Right Ventricle: The right ventricular size is normal. Right ventricular systolic function is mildly reduced. Left Atrium: Left atrial size was normal in size. Right Atrium: Right atrial size was normal in size. Dalton Mattel Electronically signed by Ezra Kanner Signature Date/Time: 07/03/2023/12:13:28 PM    Final    US  Abdomen Limited RUQ (LIVER/GB) Result Date: 07/02/2023 CLINICAL DATA:  151471 RUQ pain 151471 EXAM: ULTRASOUND ABDOMEN LIMITED RIGHT UPPER QUADRANT COMPARISON:  CT AP, 07/02/2023. FINDINGS: Gallbladder: Minimally distended gallbladder. No gallstones or wall thickening visualized. No sonographic Murphy sign noted by sonographer. Common bile duct: Diameter: 0.4 cm Liver: No focal lesion identified. Increased hepatic parenchymal echogenicity. Portal vein is patent on color Doppler imaging with normal direction of blood flow towards the liver. Other: No perihepatic ascites. IMPRESSION: 1. No acute sonographic findings within the RIGHT upper quadrant. 2. Echogenic liver. Findings most commonly seen in hepatic steatosis, though may also represent hepatitis and/or fibrosis. Electronically Signed   By: Thom Hall M.D.   On: 07/02/2023 16:31   CT ABDOMEN PELVIS WO CONTRAST Result Date: 07/02/2023 CLINICAL DATA:  Acute, nonlocalized abdominal pain. EXAM: CT ABDOMEN AND PELVIS WITHOUT CONTRAST TECHNIQUE: Multidetector CT imaging of the abdomen and pelvis was performed following the standard protocol without IV contrast. RADIATION DOSE REDUCTION: This exam was performed according to the departmental dose-optimization program which includes automated exposure control, adjustment of the mA and/or kV according to patient size and/or use of iterative reconstruction technique. COMPARISON:  KUB 06/28/2023, AP chest 06/28/2023 and 07/02/2023, CT abdomen and pelvis 01/31/2023 FINDINGS: Lower chest: Partial visualization of  Impella heart pump terminating within the left ventricle and with associated catheter within the visualized descending aorta, right common and external iliac arteries common extending towards the anterior right groin soft tissues. Weighted tip enteric tube again curls within the stomach with the tip overlying the posterior aspect of the gastric body. Lack of intravenous fluid limits evaluation of the solid abdominal and pelvic organ parenchyma. The following findings are made within this limitation. Hepatobiliary: Smooth liver contours. No gross focal liver lesion is seen. The gallbladder is again mildly distended but no definite inflammatory changes are noted. No intrahepatic biliary ductal dilatation. Pancreas: Unremarkable. Spleen: Unremarkable. Adrenals/Urinary Tract: Normal bilateral adrenals. No hydronephrosis. Punctate nonobstructing right lower pole and left lower pole renal stones  unchanged.The urinary bladder is decompressed by indwelling Foley catheter. Stomach/Bowel: 1 mild-to-moderate sigmoid diverticulosis. Mild air-fluid levels within the distal transverse and the descending portions of the colon. Mild diverticula within the proximal transverse colon, similar to prior. No inflammatory changes to indicate acute diverticulitis. The terminal ileum is unremarkable. No dilated loops of bowel are seen to indicate bowel obstruction. Normal appendix (coronal images a 50 through 72, axial images 53 through 61). Vascular/Lymphatic: Moderate to high-grade atherosclerotic calcifications. No abdominal aortic aneurysm. No mesenteric, retroperitoneal, or pelvic pathologically 1 enlarged lymph nodes by CT criteria. Reproductive: The prostate and seminal vesicles are grossly unremarkable. Other: No ventral abdominal wall hernia. No free air or free fluid is seen within the abdomen or pelvis. Musculoskeletal: There is partial lumbarization of S1. Mild-to-moderate posterior L5-S1 disc space narrowing and endplate  osteophytes. IMPRESSION: 1. Mild air-fluid levels within the distal transverse and descending portions of the colon. This is nonspecific but can be seen with diarrheal illness. 2. Mild-to-moderate sigmoid diverticulosis. No inflammatory changes to indicate acute diverticulitis. 3. Unchanged punctate nonobstructing bilateral lower pole renal stones. No hydronephrosis. Electronically Signed   By: Tanda Lyons M.D.   On: 07/02/2023 11:56    Labs: BMET Recent Labs  Lab 07/01/23 0435 07/01/23 1729 07/01/23 2334 07/02/23 0455 07/02/23 1328 07/02/23 1507 07/03/23 0403 07/03/23 1339 07-10-2023 0248  NA 132* 132* 131* 131* 129* 130* 132* 132* 133*  K 4.5 4.7 4.4 4.7 4.9 4.5 4.4 4.9 4.5  CL 99 98 98 99  --  98 97* 98 99  CO2 27 25 22 25   --  24 25 24 26   GLUCOSE 293* 236* 308* 249*  --  293* 230* 315* 302*  BUN 20 22* 25* 27*  --  26* 28* 26* 27*  CREATININE 2.28* 2.27* 2.38* 2.49*  --  2.57* 2.49* 2.43* 2.25*  CALCIUM  8.0* 8.2* 8.1* 7.9*  --  7.8* 8.0* 8.2* 8.0*  PHOS 2.0*  2.0* 2.0*  2.1*  --  2.2*  --  2.4* 2.3* 1.7* 2.4*   CBC Recent Labs  Lab 07/02/23 0938 07/02/23 1328 07/02/23 2208 07/03/23 0403 07-10-2023 0248  WBC 20.3*  --  17.7* 20.2* 18.2*  HGB 7.2* 6.8* 6.9* 6.8* 6.6*  HCT 21.4* 20.0* 20.6* 20.5* 20.2*  MCV 86.6  --  86.6 88.0 89.4  PLT 100*  --  78* 72* 62*    Medications:     aspirin  EC  81 mg Oral Daily   atorvastatin   80 mg Oral Daily   Chlorhexidine  Gluconate Cloth  6 each Topical Daily   famotidine   20 mg Oral Daily   feeding supplement (PROSource TF20)  60 mL Per Tube TID   insulin  aspart  0-20 Units Subcutaneous Q4H   insulin  aspart  11 Units Subcutaneous Q4H   insulin  glargine-yfgn  50 Units Subcutaneous Daily   lidocaine  (cardiac) 100 mg/5mL       multivitamin  1 tablet Oral BID   mouth rinse  15 mL Mouth Rinse 4 times per day   pantoprazole   40 mg Oral Daily   senna-docusate  1 tablet Oral BID   sodium chloride  flush  3 mL Intravenous Q12H     Almarie Bonine, MD 07-10-2023, 8:50 AM

## 2023-07-31 NOTE — Progress Notes (Signed)
   Aug 03, 2023 1146  Spiritual Encounters  Type of Visit Initial  Care provided to: Pt not available  Conversation partners present during encounter Other (comment) Tax Adviser)  Referral source Code page  Reason for visit Code  OnCall Visit Yes   Chaplain responded to a code blue. Patient has died. No family is present at this time, although it sounds like they have been notified and are on the way in. Spiritual care services available as needed.   Juliene CHRISTELLA Das, Chaplain August 03, 2023

## 2023-07-31 NOTE — Progress Notes (Signed)
 eLink Physician-Brief Progress Note Patient Name: Gabriel Shaw DOB: 1971-02-04 MRN: [id number]   Date of Service  Jul 06, 2023  HPI/Events of Note  Chest pain / tightness. Not new. Cardiology notes also mention he has had this and no EKG changes that are new. RN has given Maalox, and he is also on Pepcid  as well as protonix  to cover Gi causes. No arm pain , may be muscular. RN notes he got a dose of dilaudid  at 9 pm which helped him. Changing his position in bed has not helped much. Has impella in, and on CRRT  eICU Interventions  Fully awake and alert Tylenol  also given by RN  Will repeat dilaudid  x 1      Intervention Category Intermediate Interventions: Pain - evaluation and management  [place] G Destiny Hagin 06-Jul-2023, 1:45 AM

## 2023-07-31 NOTE — Death Summary Note (Signed)
 Advanced Heart Failure Death Summary  Death Summary   Patient ID: Gabriel Shaw MRN: [id number], DOB/AGE: [birth year] 53 y.o. Admit date: 06/25/2023 D/C date:     07/05/2023   Primary Discharge Diagnoses:  Late presenting anterior/inferior STEMI/CAD Cardiogenic shock Acute systolic CHF VT/VF Acute renal failure Septic shock Anemia  Hospital Course:   53 y.o. Jehovah's witness AAM with DM II, HTN, obesity, chronic RLE wound who presented 06/25/23 with late presenting anterior/inferior STEMI. Cardiac cath with totally occluded m LAD, diffuse disease RCA and 70% distal left Cx. Unsuccessful attempt at PCI to LAD X 3. No options for revascularization, med management recommended. Impella CP placed for cardiogenic shock and he was transferred to [facility] for further management. Echo with EF < 20%, apical thrombus, RV okay.Course c/b afib chemically converted with IV amiodarone , acute renal failure necessitating CRRT, severe anemia and thrombocytopenia (refused blood products for religious reasons), and suspected septic shock treated with empiric antibiotics.   Developed frequent ventricular ectopy and NSVT on 01/04 despite lidocaine  and amiodarone  drips. On 20-Jul-2023, he continued with frequent VT associated w/ progressive hypotension and suction alarms on Impella. Had agonal respirations. Pressors rapidly titrated. He then became bradycardic with HR in 30s, no improvement with atropine . Pulses were lost, CPR started and ACLS protocol followed. Shocked X 2 for course VF. He was intubated by CCM. Despite all measures ROSC was not achieved. POCUS with loss of RV/LV contractility (Please see code documentation notes for further details). Time of death 11:40 AM on Jul 20, 2023.   Significant Diagnostic Studies Limited echo, 07/03/23: IMPRESSIONS   1. LV apical thrombus remains present. The Impella inflow is located about 4 cm from aortic valve. It is in a stable position, I did not adjust. Left ventricular  ejection fraction, by estimation, is 20 to 25%. The left ventricle has severely decreased function. The left ventricle demonstrates global hypokinesis. There is mild concentric left ventricular hypertrophy.   2. Right ventricular systolic function is mildly reduced. The right ventricular size is normal.   R/LHC, Impella CP placement, 06/25/23: .  LPAV lesion is 70% stenosed. .  Mid LAD to Dist LAD lesion is 100% stenosed. .  Mid LAD lesion is 70% stenosed. .  Prox RCA lesion is 90% stenosed. .  Prox RCA to Mid RCA lesion is 90% stenosed. .  There is severe left ventricular systolic dysfunction. .  LV end diastolic pressure is moderately elevated. .  The left ventricular ejection fraction is less than 25% by visual estimate.   1.  Heavily calcified coronary arteries especially the LAD with occluded mid to distal LAD which is the likely culprit for anterior/inferior STEMI (wraparound LAD) with late presentation.  In addition, there is 70% stenosis in the distal left circumflex and diffuse disease in a small nondominant right coronary artery.  The coronary arteries are diffusely diseased overall. 2.  Severely reduced LV systolic function with an EF less than 20% with mid to distal anterior and apical akinesis. 3.  Attempted unsuccessful PCI of the LAD due to inability to cross the occlusion in spite of using 3 different wires.  Suspect acute on chronic occlusion with onset being 2 days ago. 4.  Successful Impella CP mechanical support device placement via the right common femoral artery due to cardiogenic shock. 5.  Right heart catheterization at the end showed normal RA pressure, mild pulmonary hypertension and moderately reduced cardiac output.   Recommendations: Transfer to [facility] for further support. No percutaneous or surgical revascularization option  to the LAD in that area is likely nonviable at this point.  I do not think the patient benefits from PCI to the distal left  circumflex.  Will treat medically for coronary artery disease at this time. I discussed the case with Dr. Wendel and Dr. Bensimhon.    Consultations  Advanced Heart Failure PCCM Nephrology Palliative Care   Duration of Discharge Encounter: Greater than 35 minutes   Signed, Diannie Willner, MANUELITA SAILOR, PA-C 07/05/2023, 2:38 PM

## 2023-07-31 NOTE — Progress Notes (Signed)
 Patient ID: DMARION PERFECT, male   DOB: 1970/08/16, 53 y.o.   MRN: [id number]     Advanced Heart Failure Rounding Note  Cardiologist: None   Chief Complaint: Cardiogenic shock  Subjective:    - CRRT discontinued this morning due to recurrent clotting; slightly negative this morning.  - CVP 10.  - On levophed  7 to 10 mcg, milrinone  0.375mcg/kg/min - Awake/alert; no complaints this AM, however, required dilaudid  frequently overnight for chest pain. Continues to have abdominal pain with light palpation.   Objective:   Weight Range: 97.8 kg Body mass index is 26.95 kg/m.   Vital Signs:   Temp:  [98.1 F (36.7 C)-98.9 F (37.2 C)] 98.1 F (36.7 C) 07-10-23 0400) Pulse Rate:  [65-85] 77 July 10, 2023 0830) Resp:  [9-26] 13 07/10/23 0830) BP: (104-130)/(57-89) 104/75 Jul 10, 2023 0000) SpO2:  [87 %-100 %] 100 % 07/10/2023 0830) Arterial Line BP: (87-137)/(53-75) 105/58 07-10-2023 0830) Weight:  [97.8 kg] 97.8 kg 07-10-23 0600) Last BM Date : 07/03/23  Weight change: Filed Weights   07/02/23 0256 07/03/23 0500 07-10-23 0600  Weight: 98.6 kg 93.7 kg 97.8 kg    Intake/Output:   Intake/Output Summary (Last 24 hours) at 07/10/2023 1002 Last data filed at 07/10/2023 0900 Gross per 24 hour  Intake 4661.9 ml  Output 4007 ml  Net 654.9 ml      Physical Exam   General: NAD Neck:JVP 8-10 Lungs: coarse; decreased at bases CV: Nondisplaced PMI.  Heart rate regular with frequent ectopy Abdomen: Soft, tender to palpation Skin: Intact without lesions or rashes.  Neurologic: Alert and oriented x 3.  Psych: Normal affect. Extremities: No clubbing or cyanosis.  HEENT: Normal.   Telemetry   NSR 80s with frequent PVCs  Labs    CBC Recent Labs    07/03/23 0403 Jul 10, 2023 0248  WBC 20.2* 18.2*  HGB 6.8* 6.6*  HCT 20.5* 20.2*  MCV 88.0 89.4  PLT 72* 62*   Basic Metabolic Panel Recent Labs    98/96/74 0000 07/02/23 0455 07/03/23 1339 07-10-23 0248  NA  --    < > 132* 133*  K  --    < > 4.9 4.5   CL  --    < > 98 99  CO2  --    < > 24 26  GLUCOSE  --    < > 315* 302*  BUN  --    < > 26* 27*  CREATININE  --    < > 2.43* 2.25*  CALCIUM   --    < > 8.2* 8.0*  MG 3.2*  --   --  2.5*  PHOS  --    < > 1.7* 2.4*   < > = values in this interval not displayed.   Liver Function Tests Recent Labs    07/01/23 2334 07/02/23 0455 07/02/23 0856 07/02/23 1507 07/03/23 1339 07-10-2023 0248  AST 30  --  34  --   --   --   ALT 23  --  25  --   --   --   ALKPHOS 53  --  53  --   --   --   BILITOT 0.5  --  0.6  --   --   --   PROT 6.1*  --  6.1*  --   --   --   ALBUMIN  2.6*   < > 2.6*   < > 2.9* 2.8*   < > = values in this interval not displayed.  Other results:   Imaging   - Reviewed echocardiogram from 06/29/29; small LV thrombus; LVEF 25%.   Medications:   Scheduled Medications:  aspirin  EC  81 mg Oral Daily   atorvastatin   80 mg Oral Daily   Chlorhexidine  Gluconate Cloth  6 each Topical Daily   famotidine   20 mg Oral Daily   feeding supplement (PROSource TF20)  60 mL Per Tube TID   insulin  aspart  0-20 Units Subcutaneous Q4H   insulin  aspart  11 Units Subcutaneous Q4H   insulin  glargine-yfgn  50 Units Subcutaneous Daily   lidocaine  (cardiac) 100 mg/5mL       multivitamin  1 tablet Oral BID   mouth rinse  15 mL Mouth Rinse 4 times per day   pantoprazole   40 mg Oral Daily   senna-docusate  1 tablet Oral BID   sodium chloride  flush  3 mL Intravenous Q12H    Infusions:   prismasol  BGK 4/2.5 750 mL/hr at July 09, 2023 0736    prismasol  BGK 4/2.5 400 mL/hr at 07/09/2023 0400   amiodarone  60 mg/hr (July 09, 2023 0900)   anticoagulant sodium citrate      bivalirudin  (ANGIOMAX ) 250 mg in sodium chloride  0.9 % 500 mL (0.5 mg/mL) infusion 0.05 mg/kg/hr (01/09/24 0900)   doxycycline  (VIBRAMYCIN ) IV 100 mg (07-09-2023 0926)   feeding supplement (VITAL 1.5 CAL) 65 mL/hr at 2023/07/09 0900   milrinone  0.375 mcg/kg/min (07-09-23 0900)   norepinephrine  (LEVOPHED ) Adult infusion 9 mcg/min (01/09/24  0900)   piperacillin -tazobactam (ZOSYN )  IV Stopped (07/09/23 0859)   prismasol  BGK 4/2.5 2,000 mL/hr at Jul 09, 2023 0555   promethazine  (PHENERGAN ) injection (IM or IVPB) 12.5 mg (06/27/23 2030)   sodium bicarbonate  25 mEq (Impella PURGE) in dextrose  5 % 1000 mL bag 9.3 mL/hr at 07/02/23 1347    PRN Medications: acetaminophen , alum & mag hydroxide-simeth, anticoagulant sodium citrate , chlorproMAZINE  (THORAZINE ) injection, haloperidol  lactate, heparin , lidocaine  (cardiac) 100 mg/73mL, ondansetron  (ZOFRAN ) IV, mouth rinse, mouth rinse, mouth rinse, polyethylene glycol, promethazine  (PHENERGAN ) injection (IM or IVPB), sodium chloride , sodium chloride  flush  Assessment/Plan   1. SCAI D Cardiogenic Shock 2/2 late presenting anterior STEMI - Presented after 48H chest pain on 06/25/23 w/ 70% mid LAD, 100% m-dLAD, 90% pRCA, 90% mRCA, 70% LPAV.  Disease not amenable to PCI; unable to cross LAD lesion.  - Right femoral impella CP placed at presentation.  - He is not a candidate for advanced therapies due to renal failure; he is also a Jehovah's witness. At this time I think our goal is to remove impella CP with transition to inotropes only. - Bedside TTE with LVEF 30%; small LV thrombus visible; appears to be improving.  - Continues to have chest pain; likely anginal v Dresslers? Will consider starting colchicine low dose.  - Aspirin  81mg  daily, Lipitor 80mg  - Bedside TTE with LVEF 30% this AM; weaning impella flows to P7 from P8; continue milrinone  0.384mcg/kg/min, low dose levophed . Mixed venous 65% w/ Hgb of 7 consistent with CI > 3L/min/m2.  - CVP 8-10. Will likely require CRRT for volume removal within the next 1-2 days.  - start lidocaine  0.5mg  for ectopy; continue amiodarone  60mg /hr.   2. Acute systolic heart failure 2/2 ischemic cardiomyopathy - Bedside TTE this AM (1/5) with LVEF 30%; improving LV thrombus on limited views.  - As noted above, continuing to wean impella flows as able.  -  Continue milrinone  0.375mcg/kg/min; wean levophed  as able with target MAP 65.  - Continuing to have frequent ectopy leading to loss of pulsatility; starting  lidocaine .  - Impella 3.6cm today.  - Continue bivalirudin ; goal ptt 60-80. Heparin  stopped yesterday due to clinical concern for HIT and low PTTs despite high dose hep gtt.  - Will likely require volume removal in next 24H with CRRT.   3. Acute Renal failure - Baseline sCr 1.3 - Now in oliguric renal failure with 100cc urine output daily.  - CRRT paused due to frequent clotting leading to blood loss. Targeting higher PTT goals on bival now before restarting. Discussed with pharmd.   4. Elevated LFTs: Resolved.   5. ID:  - Completed rocephin  for aspiration - Zosyn  / doxycycline  started on 07/02/23 for septic shock from possible abdominal source.  - CT with no obvious signs of infection  - WBC ct 18.2 from 20.2. Afebrile.   6. T2DM: SSI. Discussed with pharmD and CCM.   7. Anemia:  - Jehovah's witness; confirmed with him today that he does not wish for any blood products.  - Received IV iron  - Received one dose of aranesp ; we can consider re-dosing, however it is associated with increase in clotting.  - Hgb 6.6 thhis AM; repeat later today.   8. LV thrombus:  - Continue bival; goal ptt 60-80   9. Atrial fibrillation: Noted on 12/29. Converted with amiodarone  gtt. Continue amiodarone  at 60/hr. - Anticoagulation.   10. FEN: Tube feeds via Cortrack ongoing due to poor appetite/nausea.    11. Thrombocytopenia:  - I suspect secondary to impella CP running at high flows - plt count 62K today from 72.   12. Diabetic foot ulcer: Right foot.  Follows with ortho and podiatry in outpatient setting. - WOC has seen  13. NSVT/PVCs: More frequent, now on amiodarone  gtt at 60 mg/hr. Starting lidocaine  at 0.5.   CRITICAL CARE Performed by: Ria Commander   Total critical care time: 50 minutes  Critical care time was exclusive of  separately billable procedures and treating other patients.  Critical care was necessary to treat or prevent imminent or life-threatening deterioration.  Critical care was time spent personally by me on the following activities: development of treatment plan with patient and/or surrogate as well as nursing, discussions with consultants, evaluation of patient's response to treatment, examination of patient, obtaining history from patient or surrogate, ordering and performing treatments and interventions, ordering and review of laboratory studies, ordering and review of radiographic studies, pulse oximetry and re-evaluation of patient's condition.

## 2023-07-31 NOTE — Code Documentation (Signed)
 Called to bedside for intermittent VT with frequent suction alarms on impella. On my evaluation patient becoming increasingly hypotensive and obtunded. Levophed  increased from 10 to 30mcg; vasopressin  added on. Bedside TTE w/ impella noted to be only 2cm deep; I quickly advanced the impella 2cm under ultrasound guidance. Rapid IVF bolus + albumin  was started for hypotension. Despite this he continued to have significant breakthrough ectopy. Lidocaine  bolus give, amiodarone  push. Due to agonal respirations, CCM was called to bedside. Patient's heart rate dropped to the 30s. He was given atropine  with no improvement. Pulses were lost and ACLS protocol was followed with multiple rounds of chest compressions, calcium /bicarb/epi pushes given. He was shocked x 2 for coarse VF. He was emergently intubated by Dr. Gretta. Unfortunately despite all measures patient did not have ROSC. Code was terminated after there were no pulses on arterial line, impella waveform or doppler of the femoral artery. POCUS with loss of RV/LV contractivility. Impella remained appropriately positioned. Time of death: 1140.   Cctime: 1hr.   Gabriel Shaw 12:58 PM

## 2023-07-31 NOTE — Progress Notes (Addendum)
 Mother still has not been to bedside since she was called and asked to come in during code. I called to notify her of her son's death, but she did not answer. I left a message asking her to please call the unit back for an update.  Leita SHAUNNA Gaskins, DO 07/30/2023 12:59 PM [facility]  For contact information, see Amion. If no response to pager, please call PCCM consult pager. After hours, 7PM- 7AM, please call Elink.    Ms. Tamburo called back and I was able to update her over the phone. She is with a family member and she decided she will turn around to head home rather than coming to see him post-mortem.   Leita SHAUNNA Gaskins, DO 2023-07-30 1:08 PM [facility]

## 2023-07-31 NOTE — Progress Notes (Signed)
 NAME:  Gabriel Shaw, MRN:  [id number], DOB:  01/11/1971, LOS: 9 ADMISSION DATE:  06/25/2023, CONSULTATION DATE:  1/3 REFERRING MD:  Rolan, CHIEF COMPLAINT:  encephalopathy   History of Present Illness:  Gabriel Shaw is a 53 y/o gentleman with a history of Jehovah's Shaw, DM, diabetic foot wound on R foot who presented with late STEMI on 12/27 with 2 days of chest pain that had suddenly worsened Gabriel morning of presentation.  At presentation he underwent left heart cath demonstrating multivessel disease which was unable to be intervened upon severe stenosis and inability to cross with a wire.  During left heart cath he had an Impella CP placed with started on norepinephrine .  He was transferred from [facility] to [facility] and has remained on heparin , Impella with bicarb purge.  He initially did complication groin hematoma which is improved.  He has not received blood products due to being a Jehovah's Shaw.  He had been improving with decreasing norepinephrine  requirements; only requiring milrinone  and no longer requiring vasopressors.  And was feeling well yesterday.  Today he is lethargic and complaining of abdominal pain.  Abdominal pain is most notable in Gabriel right upper quadrant, but he endorses pain all over.  He has been continued on CRRT since 12/31.  Earlier this admission he completed 5 days of ceftriaxone  empirically, which was discontinued on 1/1.  Pertinent  Medical History  Diabetes Hypertension Hyperlipidemia GERD [place] foot wound  Significant Hospital Events: Including procedures, antibiotic start and stop dates in addition to other pertinent events   1227 admission, left heart cath unable to be revascularized 12/28 ceftriaxone  started, completed course on 1/1 1/2 swan removed 1/3 started doxy, zosyn   Interim History / Subjective:  Afebrile. Had CP overnight again. Not tolerating CRRT this morning.   Objective   Blood pressure 104/75, pulse 77, temperature 98.1 F (36.7  C), temperature source Axillary, resp. rate 13, height 6' 3 (1.905 m), weight 97.8 kg, SpO2 100%. CVP:  [4 mmHg-22 mmHg] 10 mmHg      Intake/Output Summary (Last 24 hours) at 30-Jul-2023 0941 Last data filed at 07-30-23 0900 Gross per 24 hour  Intake 4919.95 ml  Output 4247 ml  Net 672.95 ml   Filed Weights   07/02/23 0256 07/03/23 0500 07-30-2023 0600  Weight: 98.6 kg 93.7 kg 97.8 kg    Examination: General: ill appearing man lying in bed in NAD HENT: [place]/AT, eyes anicteric Lungs: breathing comfortably on [place], CTAB Cardiovascular:  S1S2, irreg rhythm, frequent PVCs. Impella P7 Abdomen: no guarding, still mildly TTP Extremities: no edema or cyanosis, impella R groin with normal distal perfusion Neuro: sleepy, arouses to verbal stimulation, answering questions appropriately. Derm: warm, dry, no rashes  Co. ox 65% Sodium 134 BUN 27, creatinine 2.25 on CRRT. Phos 2.4 LDH 661 WBC 18.2 H/H 6.6/20.2 Platelets 62 Echo- pending  Blood cultures:  NGTD  Trop x 2 yesterday in 6700s, down from presentation  Resolved Hospital Problem list     Assessment & Plan:  Acute encephalopathy, concerned that he has an acute infection or metabolic source related to Gabriel abdominal pain. Improved today. Sleep deprivation is likely contributing at this point. -avoid deliriogenic meds  Severe abdominal pain with guarding-- worry about low level mesenteric hypoperfusion or translocation. No evidence of biliary disease. Could have had infectious colitis with diarrhea, but ischemic seems more likely. Has had diarrhea.  -con't cardiac support; worry about LV thrombus and potential for showering of thrombus from LV -needs continued coverage  for gut translocation -dilaudid  PRN  Chest pain- anginal due to uncorrected coronary disease vs symptomatic anemia vs due to GERD vs pericardial less likely -treating for GERD- pantoprazole  -adding colchicine in case this is pericardial in origin.  Jehovah's  Shaw - See separate documentation-discussed with him & Gabriel Shaw 1/3- he is okay with albumin  but not other blood products.    Acute blood loss anemia due to groin hematoma initially, slowly losing blood in CRRT circuits; likely low production due to acute illness.  - no blood transfusions due to being jehova's Shaw -limit blood draws -stopping CRRT due to frequent clotting and risk of anemia -previously received aranesp  (12/30) and iron  (12/31)  Acute HFrEF due to ischemic cardiomyopathy due to late presenting STEMI without revascularization Cardiogenic shock LV apical thrombus Afib, new onset -impella and milrinone  per AHF-- weaning impella today; hopeful it can be removed this week  -serial coox -unable to upsize to higher level of MCS due to anemia, LV thrombus -con't bival -bicarb purge for impella -con't amiodarone  for frequent ventricular ectopy -aspirin , statin; hold B-blocker while in cardiogenic shock-worry about ongoing CP, uncorrected CAD, ongoing anemia, failure of cardiac function to recover, and potentially a complication of low flow cardiac status.   AKI, likely ATN due to cardiogenic shock -CRRT on hold today -strict I/O -renally dose med, avoid nephrotoxic meds -bladder scans  Risk for malnutrition -con't TF via cortrak until appetite improves -can drink and eat as tolerated  Diabetic foot ulcer R foot, present on presentation, doesn't appear acutely infected.  -con't antibiotics and wound care; does not appear to explain Gabriel source of infection  Diabetes- still uncontrolled. No response to pretty aggressive insulin  dose adjustments Gabriel past few days -transition back onto insulin  infusion for now -con't holding PTA metformin  and ozempic  -goal BG 140-180  Hypophosphatemia -hold on repletion today since CRRT paused  Regurgitation earlier in admission, has h/o GERD but concern for gastroparesis per RD.  On Ozempic  as OP.  -needs gastric emptying study  when stable; may not be a good candidate for GLP-1 if he has gastroparesis.   Case discussed with AHF.   Best Practice (right click and Reselect all SmartList Selections daily)   Diet/type: tubefeeds DVT prophylaxis other- bival Pressure ulcer(s): present on admission - diabetic foot ulcer GI prophylaxis: PPI Lines: Central line, Dialysis Catheter, and Arterial Line Foley:  Yes, and it is still needed Code Status:  full code Last date of multidisciplinary goals of care discussion [1/4-with patient and Shaw ]  Labs   CBC: Recent Labs  Lab 07/02/23 0455 07/02/23 0938 07/02/23 1328 07/02/23 2208 07/03/23 0403 07/15/2023 0248  WBC 18.4* 20.3*  --  17.7* 20.2* 18.2*  HGB 7.1* 7.2* 6.8* 6.9* 6.8* 6.6*  HCT 21.6* 21.4* 20.0* 20.6* 20.5* 20.2*  MCV 87.1 86.6  --  86.6 88.0 89.4  PLT 101* 100*  --  78* 72* 62*    Basic Metabolic Panel: Recent Labs  Lab 06/30/23 1616 07/01/23 0435 07/01/23 1729 07/01/23 2334 07/02/23 0000 07/02/23 0455 07/02/23 1328 07/02/23 1507 07/03/23 0403 07/03/23 1339 15-Jul-2023 0248  NA 133* 132* 132*   < >  --  131* 129* 130* 132* 132* 133*  K 4.3 4.5 4.7   < >  --  4.7 4.9 4.5 4.4 4.9 4.5  CL 99 99 98   < >  --  99  --  98 97* 98 99  CO2 26 27 25    < >  --  25  --  24 25 24 26   GLUCOSE 269* 293* 236*   < >  --  249*  --  293* 230* 315* 302*  BUN 22* 20 22*   < >  --  27*  --  26* 28* 26* 27*  CREATININE 2.37* 2.28* 2.27*   < >  --  2.49*  --  2.57* 2.49* 2.43* 2.25*  CALCIUM  8.0* 8.0* 8.2*   < >  --  7.9*  --  7.8* 8.0* 8.2* 8.0*  MG 2.5* 2.4 2.6*  --  3.2*  --   --   --   --   --  2.5*  PHOS 2.1*  2.1* 2.0*  2.0* 2.0*  2.1*  --   --  2.2*  --  2.4* 2.3* 1.7* 2.4*   < > = values in this interval not displayed.   GFR: Estimated Creatinine Clearance: 45.9 mL/min (A) (by C-G formula based on SCr of 2.25 mg/dL (H)). Recent Labs  Lab 07/02/23 0911 07/02/23 0938 07/02/23 1334 07/02/23 2208 07/03/23 0403 07/03/23 1337 07/20/2023 0248   PROCALCITON  --  0.34  --   --   --   --   --   WBC  --  20.3*  --  17.7* 20.2*  --  18.2*  LATICACIDVEN 2.2*  --  1.3  --   --  1.7  --     Liver Function Tests: Recent Labs  Lab 06/28/23 0408 06/28/23 1553 06/29/23 0505 06/29/23 1617 06/30/23 0417 06/30/23 1616 07/01/23 2334 07/02/23 0455 07/02/23 0856 07/02/23 1507 07/03/23 0403 07/03/23 1339 07/20/2023 0248  AST 52*  --  39  --  37  --  30  --  34  --   --   --   --   ALT 28  --  24  --  24  --  23  --  25  --   --   --   --   ALKPHOS 44  --  43  --  48  --  53  --  53  --   --   --   --   BILITOT 0.6  --  0.7  --  0.7  --  0.5  --  0.6  --   --   --   --   PROT 5.8*  --  5.5*  --  5.8*  --  6.1*  --  6.1*  --   --   --   --   ALBUMIN  2.8*   < > 2.6*   < > 2.5*  2.5*   < > 2.6*   < > 2.6* 2.5* 3.1* 2.9* 2.8*   < > = values in this interval not displayed.   No results for input(s): LIPASE, AMYLASE in Gabriel last 168 hours. Recent Labs  Lab 07/02/23 1215 07/03/23 0403  AMMONIA 23 15    ABG    Component Value Date/Time   PHART 7.488 (H) 07/02/2023 1328   PCO2ART 33.8 07/02/2023 1328   PO2ART 123 (H) 07/02/2023 1328   HCO3 25.6 07/02/2023 1328   TCO2 27 07/02/2023 1328   ACIDBASEDEF 7.0 (H) 06/25/2023 1023   ACIDBASEDEF 9.0 (H) 06/25/2023 1023   O2SAT 64.8 07/20/23 0248       Critical care time:  40 min.     Leita SHAUNNA Gaskins, DO 07/20/2023 10:51 AM [facility]  For contact information, see Amion. If no response to pager, please call PCCM consult pager. After hours,  7PM- 7AM, please call Elink.

## 2023-07-31 NOTE — Code Documentation (Addendum)
 Called to bedside for VT, more frequent ectopy with cardiology. Developed progressive hypotension. Suction alarms on Impella, adjustments made by RN & Cardiology. Added lidocaine , epi, vasopressin . Became altered and developed agonal respirations shortly after. Developed bradycardia- treated with epi push and atropine . Began BVM. Lost pulses, CPR started. Intubated. In addition to epi per ACLS protocol, bicarb, calcium  also given. Shocks at 200J for VF. No cardiac activity on bedside POCUS. Code terminated due to lack of response to interventions. Cardiology at bedside throughout. Mother called in to the hospital. She will be updated when she arrives.   Leita SHAUNNA Gaskins, DO 17-Jul-2023 11:51 AM [facility]  For contact information, see Amion. If no response to pager, please call PCCM consult pager. After hours, 7PM- 7AM, please call Elink.

## 2023-07-31 DEATH — deceased

## 2023-08-06 ENCOUNTER — Other Ambulatory Visit: Payer: Self-pay
